# Patient Record
Sex: Male | Born: 1945 | Race: Black or African American | Hispanic: No | Marital: Married | Smoking: Former smoker
Health system: Southern US, Community
[De-identification: ages and names within clinical notes are randomized; demographics above are authoritative.]

## PROBLEM LIST (undated history)

## (undated) DIAGNOSIS — H269 Unspecified cataract: Secondary | ICD-10-CM

## (undated) DIAGNOSIS — E119 Type 2 diabetes mellitus without complications: Secondary | ICD-10-CM

## (undated) DIAGNOSIS — I639 Cerebral infarction, unspecified: Secondary | ICD-10-CM

## (undated) DIAGNOSIS — I10 Essential (primary) hypertension: Secondary | ICD-10-CM

## (undated) HISTORY — DX: Unspecified cataract: H26.9

## (undated) HISTORY — PX: NO PAST SURGERIES: SHX2092

---

## 2008-03-27 ENCOUNTER — Ambulatory Visit: Payer: Self-pay | Admitting: Internal Medicine

## 2008-03-27 LAB — CONVERTED CEMR LAB
ALT: 22 units/L (ref 0–53)
AST: 24 units/L (ref 0–37)
Albumin: 4.2 g/dL (ref 3.5–5.2)
Alkaline Phosphatase: 72 units/L (ref 39–117)
BUN: 10 mg/dL (ref 6–23)
Basophils Relative: 0.7 % (ref 0.0–1.0)
Chloride: 106 meq/L (ref 96–112)
Creatinine, Ser: 0.9 mg/dL (ref 0.4–1.5)
Direct LDL: 157.6 mg/dL
Eosinophils Relative: 1.4 % (ref 0.0–5.0)
Glucose, Bld: 109 mg/dL — ABNORMAL HIGH (ref 70–99)
HCT: 42.2 % (ref 39.0–52.0)
HDL: 34.3 mg/dL — ABNORMAL LOW (ref >39.0)
Monocytes Relative: 6.8 % (ref 3.0–12.0)
Neutrophils Relative %: 56.1 % (ref 43.0–77.0)
Platelets: 181 10*3/uL (ref 150–400)
Potassium: 4.3 meq/L (ref 3.5–5.1)
RBC: 5.04 M/uL (ref 4.22–5.81)
Total CHOL/HDL Ratio: 6.6
Total Protein: 7.1 g/dL (ref 6.0–8.3)
Triglycerides: 118 mg/dL (ref 0–149)
WBC: 7.4 10*3/uL (ref 4.5–10.5)

## 2008-04-12 ENCOUNTER — Ambulatory Visit: Payer: Self-pay | Admitting: Gastroenterology

## 2008-04-25 ENCOUNTER — Ambulatory Visit: Payer: Self-pay | Admitting: Internal Medicine

## 2008-04-26 ENCOUNTER — Ambulatory Visit: Payer: Self-pay | Admitting: Gastroenterology

## 2008-04-26 ENCOUNTER — Encounter: Payer: Self-pay | Admitting: Gastroenterology

## 2008-05-08 ENCOUNTER — Encounter: Payer: Self-pay | Admitting: Gastroenterology

## 2010-02-10 ENCOUNTER — Ambulatory Visit: Payer: Self-pay | Admitting: Internal Medicine

## 2010-02-10 DIAGNOSIS — R109 Unspecified abdominal pain: Secondary | ICD-10-CM | POA: Insufficient documentation

## 2010-08-05 ENCOUNTER — Ambulatory Visit: Payer: Self-pay | Admitting: Internal Medicine

## 2010-08-05 LAB — CONVERTED CEMR LAB
ALT: 26 units/L (ref 0–53)
Alkaline Phosphatase: 79 units/L (ref 39–117)
Basophils Relative: 0.5 % (ref 0.0–3.0)
Bilirubin, Direct: 0.1 mg/dL (ref 0.0–0.3)
Chloride: 104 meq/L (ref 96–112)
Cholesterol: 246 mg/dL — ABNORMAL HIGH (ref 0–200)
Creatinine, Ser: 0.9 mg/dL (ref 0.4–1.5)
Eosinophils Relative: 1.3 % (ref 0.0–5.0)
Hemoglobin: 13.5 g/dL (ref 13.0–17.0)
Lymphocytes Relative: 34.1 % (ref 12.0–46.0)
MCV: 84.1 fL (ref 78.0–100.0)
Monocytes Absolute: 0.7 10*3/uL (ref 0.1–1.0)
Neutrophils Relative %: 55.8 % (ref 43.0–77.0)
Nitrite: NEGATIVE
Protein, U semiquant: NEGATIVE
RBC: 4.85 M/uL (ref 4.22–5.81)
Sodium: 142 meq/L (ref 135–145)
Total CHOL/HDL Ratio: 6
Total Protein: 6.8 g/dL (ref 6.0–8.3)
Urobilinogen, UA: 0.2
VLDL: 29.2 mg/dL (ref 0.0–40.0)
WBC Urine, dipstick: NEGATIVE
WBC: 7.9 10*3/uL (ref 4.5–10.5)

## 2010-08-06 ENCOUNTER — Telehealth (INDEPENDENT_AMBULATORY_CARE_PROVIDER_SITE_OTHER): Payer: Self-pay | Admitting: *Deleted

## 2010-08-12 ENCOUNTER — Encounter: Payer: Self-pay | Admitting: Internal Medicine

## 2010-08-12 ENCOUNTER — Ambulatory Visit: Payer: Self-pay | Admitting: Internal Medicine

## 2010-08-12 DIAGNOSIS — E785 Hyperlipidemia, unspecified: Secondary | ICD-10-CM | POA: Insufficient documentation

## 2010-08-12 DIAGNOSIS — E782 Mixed hyperlipidemia: Secondary | ICD-10-CM | POA: Insufficient documentation

## 2010-10-22 NOTE — Progress Notes (Signed)
  Call back at Home Phone [phone]      Due to problems with centricity on 08/05/10 patient's specimen could not be found at the lab so i called and left a message for patient to call me back. I would like patient to come back for a redraw

## 2010-10-22 NOTE — Assessment & Plan Note (Signed)
Summary: INTERMITTENT R SIDE PAIN // RS   Vital Signs:  Patient profile:   65 year old male Weight:      226 pounds Temp:     98.3 degrees F oral BP sitting:   150 / 80  (right arm) Cuff size:   regular  Vitals Entered By: Duard Brady LPN (Feb 10, 2010 2:39 PM) CC: c/o  (R) flank pain since last night , no n/v/d or fever Is Patient Diabetic? No   CC:  c/o  (R) flank pain since last night  and no n/v/d or fever.  History of Present Illness: 65 year old patient who has enjoyed good health.  He was stable until about  9 p.m. yesterday evening when he had the onset of left flank pain.  The pain was quite severe and finally resolve somewhat about 1 a.m. where he was able to sleep through the night.  This morning had some mild the left-sided discomfort that persisted.  There is no nausea or vomiting.  He does describe some slightly worse constipation of late, but nothing significant.  He also describes the urine being somewhat darker.  When he had the pain, there  was  no alleviating or precipitating factors.  Preventive Screening-Counseling & Management  Alcohol-Tobacco     Smoking Status: quit  Allergies: 1)  ! Penicillin G Sodium (Penicillin G Sodium)  Past History:  Past Medical History: Reviewed history from 03/27/2008 and no changes required. unremarkable  Review of Systems       The patient complains of abdominal pain.  The patient denies anorexia, fever, weight loss, weight gain, vision loss, decreased hearing, hoarseness, chest pain, syncope, dyspnea on exertion, peripheral edema, prolonged cough, headaches, hemoptysis, melena, hematochezia, severe indigestion/heartburn, hematuria, incontinence, genital sores, muscle weakness, suspicious skin lesions, transient blindness, difficulty walking, depression, unusual weight change, abnormal bleeding, enlarged lymph nodes, angioedema, breast masses, and testicular masses.    Physical Exam  General:  overweight-appearing.   normal blood pressure, no distress Head:  Normocephalic and atraumatic without obvious abnormalities. No apparent alopecia or balding. Eyes:  No corneal or conjunctival inflammation noted. EOMI. Perrla. Funduscopic exam benign, without hemorrhages, exudates or papilledema. Vision grossly normal. Mouth:  Oral mucosa and oropharynx without lesions or exudates.  Teeth in good repair. Neck:  No deformities, masses, or tenderness noted. Lungs:  Normal respiratory effort, chest expands symmetrically. Lungs are clear to auscultation, no crackles or wheezes. Heart:  Normal rate and regular rhythm. S1 and S2 normal without gallop, murmur, click, rub or other extra sounds. Abdomen:  Bowel sounds positive,abdomen soft and non-tender without masses, organomegaly or hernias noted.   Impression & Recommendations:  Problem # 1:  ABDOMINAL PAIN (ICD-789.00) patient's clinical exam, now is unremarkable.  He is essentially pain free.  Urinalysis revealed trace hematuria.  Probably passed a kidney stone  Problem # 2:  SCREENING FOR HYPERTENSION (ICD-V81.1)  Complete Medication List: 1)  No Current Meds   Patient Instructions: 1)  Please schedule a follow-up appointment in 6 months for annual exam 2)  Drink as much fluid as you can tolerate for the next few days. 3)  It is important that you exercise regularly at least 20 minutes 5 times a week. If you develop chest pain, have severe difficulty breathing, or feel very tired , stop exercising immediately and seek medical attention. 4)  You need to lose weight. Consider a lower calorie diet and regular exercise.  5)  call if abdominal/flank pain recurs  Appended Document: INTERMITTENT  R SIDE PAIN // RS     Allergies: 1)  ! Penicillin G Sodium (Penicillin G Sodium)   Complete Medication List: 1)  No Current Meds    Laboratory Results   Urine Tests  Date/Time Received: Feb 10, 2010 3:57 PM  Date/Time Reported: Feb 10, 2010 3:57 PM   Routine  Urinalysis   Color: yellow Appearance: Clear Glucose: negative   (Normal Range: Negative) Bilirubin: negative   (Normal Range: Negative) Ketone: negative   (Normal Range: Negative) Spec. Gravity: 1.020   (Normal Range: 1.003-1.035) Blood: trace-intact   (Normal Range: Negative) pH: 6.0   (Normal Range: 5.0-8.0) Protein: trace   (Normal Range: Negative) Urobilinogen: 0.2   (Normal Range: 0-1) Nitrite: negative   (Normal Range: Negative) Leukocyte Esterace: negative   (Normal Range: Negative)

## 2010-10-22 NOTE — Assessment & Plan Note (Signed)
Summary: cpx/cjr   Vital Signs:  Patient profile:   65 year old male Height:      69 inches Weight:      232 pounds BMI:     34.38 Temp:     98.5 degrees F oral BP sitting:   132 / 84  (right arm) Cuff size:   regular  Vitals Entered By: Duard Brady LPN (August 12, 2010 8:22 AM) CC: cpx- doing well Is Patient Diabetic? No   CC:  cpx- doing well.  History of Present Illness: 65 year old patient who is seen today for a wellness exam.  He is asymptomatic.  Review of his laboratory studies revealed slight worsening dyslipidemia.  This was discussed at length and will try more aggressive lifestyle measures.  Preventive Screening-Counseling & Management  Alcohol-Tobacco     Smoking Status: never  Allergies: 1)  ! Penicillin G Sodium (Penicillin G Sodium)  Past History:  Past Medical History: unremarkable Hyperlipidemia  Past Surgical History: colonoscopy 2009  Family History: Reviewed history from 03/27/2008 and no changes required. father died age 74 of complications of cirrhosis mother died age 9 complications of cardiac and peripheral vascular occlusive disease  Five brothers two sisters  One brother died recently at age 61 of colon cancer one brother deceased, age 52, heart failure one sister, age 19, status post CABG, history of type 2 diabetes  Social History: Reviewed history from 03/27/2008 and no changes required. Married two children smoked tobacco  in the 1980s, but not in greater than 25 years Regular exercise-yes adheres to a near vegetarian dietSmoking Status:  never  Review of Systems  The patient denies anorexia, fever, weight loss, weight gain, vision loss, decreased hearing, hoarseness, chest pain, syncope, dyspnea on exertion, peripheral edema, prolonged cough, headaches, hemoptysis, abdominal pain, melena, hematochezia, severe indigestion/heartburn, hematuria, incontinence, genital sores, muscle weakness, suspicious skin lesions,  transient blindness, difficulty walking, depression, unusual weight change, abnormal bleeding, enlarged lymph nodes, angioedema, breast masses, and testicular masses.    Physical Exam  General:  overweight-appearing.  120/80overweight-appearing.   Head:  Normocephalic and atraumatic without obvious abnormalities. No apparent alopecia or balding. Eyes:  No corneal or conjunctival inflammation noted. EOMI. Perrla. Funduscopic exam benign, without hemorrhages, exudates or papilledema. Vision grossly normal. Ears:  External ear exam shows no significant lesions or deformities.  Otoscopic examination reveals clear canals, tympanic membranes are intact bilaterally without bulging, retraction, inflammation or discharge. Hearing is grossly normal bilaterally. Nose:  External nasal examination shows no deformity or inflammation. Nasal mucosa are pink and moist without lesions or exudates. Mouth:  Oral mucosa and oropharynx without lesions or exudates.  Teeth in good repair. Neck:  No deformities, masses, or tenderness noted. Chest Wall:  No deformities, masses, tenderness or gynecomastia noted. Breasts:  No masses or gynecomastia noted Lungs:  Normal respiratory effort, chest expands symmetrically. Lungs are clear to auscultation, no crackles or wheezes. Heart:  Normal rate and regular rhythm. S1 and S2 normal without gallop, murmur, click, rub or other extra sounds. Abdomen:  Bowel sounds positive,abdomen soft and non-tender without masses, organomegaly or hernias noted. Rectal:  No external abnormalities noted. Normal sphincter tone. No rectal masses or tenderness. Genitalia:  Testes bilaterally descended without nodularity, tenderness or masses. No scrotal masses or lesions. No penis lesions or urethral discharge. Prostate:  Prostate gland firm and smooth, no enlargement, nodularity, tenderness, mass, asymmetry or induration. Msk:  No deformity or scoliosis noted of thoracic or lumbar spine.   Pulses:   R and  L carotid,radial,femoral,dorsalis pedis and posterior tibial pulses are full and equal bilaterally Extremities:  No clubbing, cyanosis, edema, or deformity noted with normal full range of motion of all joints.   Neurologic:  No cranial nerve deficits noted. Station and gait are normal. Plantar reflexes are down-going bilaterally. DTRs are symmetrical throughout. Sensory, motor and coordinative functions appear intact. Skin:  Intact without suspicious lesions or rashes Cervical Nodes:  No lymphadenopathy noted Axillary Nodes:  No palpable lymphadenopathy Inguinal Nodes:  No significant adenopathy Psych:  Cognition and judgment appear intact. Alert and cooperative with normal attention span and concentration. No apparent delusions, illusions, hallucinations   Impression & Recommendations:  Problem # 1:  PREVENTIVE HEALTH CARE (ICD-V70.0)  Orders: EKG w/ Interpretation (93000)  Complete Medication List: 1)  No Current Meds   Patient Instructions: 1)  Please schedule a follow-up appointment in 1 year. 2)  Limit your Sodium (Salt). 3)  It is important that you exercise regularly at least 20 minutes 5 times a week. If you develop chest pain, have severe difficulty breathing, or feel very tired , stop exercising immediately and seek medical attention. 4)  You need to lose weight. Consider a lower calorie diet and regular exercise.    Orders Added: 1)  EKG w/ Interpretation [93000] 2)  Est. Patient 40-64 years [phone]

## 2013-04-24 ENCOUNTER — Encounter: Payer: Self-pay | Admitting: Gastroenterology

## 2014-11-12 ENCOUNTER — Encounter: Payer: Self-pay | Admitting: Gastroenterology

## 2015-01-29 ENCOUNTER — Encounter: Payer: Self-pay | Admitting: Gastroenterology

## 2015-05-04 DIAGNOSIS — I639 Cerebral infarction, unspecified: Secondary | ICD-10-CM

## 2015-05-04 HISTORY — DX: Cerebral infarction, unspecified: I63.9

## 2015-05-05 ENCOUNTER — Telehealth: Payer: Self-pay | Admitting: Physician Assistant

## 2015-05-05 ENCOUNTER — Inpatient Hospital Stay (HOSPITAL_COMMUNITY)
Admission: EM | Admit: 2015-05-05 | Discharge: 2015-05-08 | DRG: 066 | Disposition: A | Discharge status: Home Health | Payer: Medicare Other | Attending: Internal Medicine | Admitting: Internal Medicine

## 2015-05-05 ENCOUNTER — Other Ambulatory Visit: Payer: Self-pay

## 2015-05-05 ENCOUNTER — Emergency Department (HOSPITAL_COMMUNITY): Payer: Medicare Other

## 2015-05-05 ENCOUNTER — Inpatient Hospital Stay (HOSPITAL_COMMUNITY): Payer: Medicare Other

## 2015-05-05 ENCOUNTER — Ambulatory Visit (HOSPITAL_COMMUNITY)
Admission: RE | Admit: 2015-05-05 | Discharge: 2015-05-05 | Disposition: A | Discharge status: Home / Self Care | Payer: Medicare Other | Source: Ambulatory Visit | Attending: Family Medicine | Admitting: Family Medicine

## 2015-05-05 ENCOUNTER — Ambulatory Visit: Payer: Self-pay | Admitting: Internal Medicine

## 2015-05-05 ENCOUNTER — Ambulatory Visit (INDEPENDENT_AMBULATORY_CARE_PROVIDER_SITE_OTHER): Payer: BC Managed Care – PPO | Admitting: Family Medicine

## 2015-05-05 ENCOUNTER — Encounter (HOSPITAL_COMMUNITY): Payer: Self-pay | Admitting: *Deleted

## 2015-05-05 VITALS — BP 160/100 | HR 90 | Temp 98.9°F | Resp 16 | Ht 69.0 in | Wt 238.0 lb

## 2015-05-05 DIAGNOSIS — Z23 Encounter for immunization: Secondary | ICD-10-CM

## 2015-05-05 DIAGNOSIS — Z8249 Family history of ischemic heart disease and other diseases of the circulatory system: Secondary | ICD-10-CM

## 2015-05-05 DIAGNOSIS — R42 Dizziness and giddiness: Secondary | ICD-10-CM | POA: Diagnosis not present

## 2015-05-05 DIAGNOSIS — R110 Nausea: Secondary | ICD-10-CM

## 2015-05-05 DIAGNOSIS — E1165 Type 2 diabetes mellitus with hyperglycemia: Secondary | ICD-10-CM | POA: Diagnosis not present

## 2015-05-05 DIAGNOSIS — I639 Cerebral infarction, unspecified: Secondary | ICD-10-CM | POA: Diagnosis not present

## 2015-05-05 DIAGNOSIS — R269 Unspecified abnormalities of gait and mobility: Secondary | ICD-10-CM | POA: Diagnosis present

## 2015-05-05 DIAGNOSIS — A499 Bacterial infection, unspecified: Secondary | ICD-10-CM

## 2015-05-05 DIAGNOSIS — N390 Urinary tract infection, site not specified: Secondary | ICD-10-CM

## 2015-05-05 DIAGNOSIS — R278 Other lack of coordination: Secondary | ICD-10-CM | POA: Diagnosis present

## 2015-05-05 DIAGNOSIS — R29818 Other symptoms and signs involving the nervous system: Secondary | ICD-10-CM | POA: Diagnosis not present

## 2015-05-05 DIAGNOSIS — E119 Type 2 diabetes mellitus without complications: Secondary | ICD-10-CM

## 2015-05-05 DIAGNOSIS — Z87891 Personal history of nicotine dependence: Secondary | ICD-10-CM | POA: Diagnosis not present

## 2015-05-05 DIAGNOSIS — R030 Elevated blood-pressure reading, without diagnosis of hypertension: Secondary | ICD-10-CM

## 2015-05-05 DIAGNOSIS — Z880 Allergy status to penicillin: Secondary | ICD-10-CM | POA: Diagnosis not present

## 2015-05-05 DIAGNOSIS — Z6832 Body mass index (BMI) 32.0-32.9, adult: Secondary | ICD-10-CM

## 2015-05-05 DIAGNOSIS — R2689 Other abnormalities of gait and mobility: Secondary | ICD-10-CM | POA: Insufficient documentation

## 2015-05-05 DIAGNOSIS — I10 Essential (primary) hypertension: Secondary | ICD-10-CM | POA: Diagnosis present

## 2015-05-05 DIAGNOSIS — E1100 Type 2 diabetes mellitus with hyperosmolarity without nonketotic hyperglycemic-hyperosmolar coma (NKHHC): Secondary | ICD-10-CM | POA: Diagnosis not present

## 2015-05-05 DIAGNOSIS — E785 Hyperlipidemia, unspecified: Secondary | ICD-10-CM | POA: Diagnosis present

## 2015-05-05 DIAGNOSIS — E669 Obesity, unspecified: Secondary | ICD-10-CM | POA: Diagnosis present

## 2015-05-05 DIAGNOSIS — IMO0002 Reserved for concepts with insufficient information to code with codable children: Secondary | ICD-10-CM | POA: Diagnosis present

## 2015-05-05 DIAGNOSIS — I340 Nonrheumatic mitral (valve) insufficiency: Secondary | ICD-10-CM | POA: Diagnosis not present

## 2015-05-05 DIAGNOSIS — R270 Ataxia, unspecified: Secondary | ICD-10-CM

## 2015-05-05 DIAGNOSIS — R112 Nausea with vomiting, unspecified: Secondary | ICD-10-CM

## 2015-05-05 DIAGNOSIS — I6340 Cerebral infarction due to embolism of unspecified cerebral artery: Secondary | ICD-10-CM | POA: Diagnosis not present

## 2015-05-05 DIAGNOSIS — I63219 Cerebral infarction due to unspecified occlusion or stenosis of unspecified vertebral arteries: Secondary | ICD-10-CM | POA: Diagnosis not present

## 2015-05-05 DIAGNOSIS — I63119 Cerebral infarction due to embolism of unspecified vertebral artery: Secondary | ICD-10-CM | POA: Diagnosis not present

## 2015-05-05 HISTORY — DX: Cerebral infarction, unspecified: I63.9

## 2015-05-05 HISTORY — DX: Essential (primary) hypertension: I10

## 2015-05-05 HISTORY — DX: Type 2 diabetes mellitus without complications: E11.9

## 2015-05-05 LAB — COMPLETE METABOLIC PANEL WITHOUT GFR
Albumin: 4.6 g/dL (ref 3.6–5.1)
Alkaline Phosphatase: 97 U/L (ref 40–115)
GFR, Est Non African American: 88 mL/min (ref >=60)
Glucose, Bld: 165 mg/dL — ABNORMAL HIGH (ref 65–99)
Sodium: 141 mmol/L (ref 135–146)
Total Bilirubin: 0.6 mg/dL (ref 0.2–1.2)
Total Protein: 7.7 g/dL (ref 6.1–8.1)

## 2015-05-05 LAB — COMPREHENSIVE METABOLIC PANEL
ALT: 40 U/L (ref 17–63)
ANION GAP: 8 (ref 5–15)
AST: 32 U/L (ref 15–41)
Albumin: 3.9 g/dL (ref 3.5–5.0)
Alkaline Phosphatase: 86 U/L (ref 38–126)
BUN: 8 mg/dL (ref 6–20)
CALCIUM: 9.1 mg/dL (ref 8.9–10.3)
CHLORIDE: 105 mmol/L (ref 101–111)
CO2: 24 mmol/L (ref 22–32)
Creatinine, Ser: 0.82 mg/dL (ref 0.61–1.24)
GFR calc Af Amer: >60 mL/min (ref >60)
GFR calc non Af Amer: >60 mL/min (ref >60)
Glucose, Bld: 217 mg/dL — ABNORMAL HIGH (ref 65–99)
POTASSIUM: 3.8 mmol/L (ref 3.5–5.1)
SODIUM: 137 mmol/L (ref 135–145)
Total Bilirubin: 0.6 mg/dL (ref 0.3–1.2)
Total Protein: 7.2 g/dL (ref 6.5–8.1)

## 2015-05-05 LAB — POCT CBC
Granulocyte percent: 59.5 % (ref 37–80)
HCT, POC: 48.6 % (ref 43.5–53.7)
Hemoglobin: 14.8 g/dL (ref 14.1–18.1)
Lymph, poc: 3.7 — AB (ref 0.6–3.4)
MCH, POC: 25.3 pg — AB (ref 27–31.2)
MCHC: 30.4 g/dL — AB (ref 31.8–35.4)
MCV: 83.2 fL (ref 80–97)
MID (cbc): 0.8 (ref 0–0.9)
MPV: 9.1 fL (ref 0–99.8)
POC Granulocyte: 6.5 (ref 2–6.9)
POC LYMPH PERCENT: 33.4 % (ref 10–50)
POC MID %: 7.1 % (ref 0–12)
Platelet Count, POC: 204 K/uL (ref 142–424)
RBC: 5.85 M/uL (ref 4.69–6.13)
RDW, POC: 14 %
WBC: 11 K/uL — AB (ref 4.6–10.2)

## 2015-05-05 LAB — CBC
HCT: 46 % (ref 39.0–52.0)
Hemoglobin: 14.8 g/dL (ref 13.0–17.0)
MCH: 27.4 pg (ref 26.0–34.0)
MCHC: 32.2 g/dL (ref 30.0–36.0)
MCV: 85.2 fL (ref 78.0–100.0)
PLATELETS: 215 10*3/uL (ref 150–400)
RBC: 5.4 MIL/uL (ref 4.22–5.81)
RDW: 13.4 % (ref 11.5–15.5)
WBC: 10.3 10*3/uL (ref 4.0–10.5)

## 2015-05-05 LAB — PROTIME-INR
INR: 1.08 (ref 0.00–1.49)
PROTHROMBIN TIME: 14.2 s (ref 11.6–15.2)

## 2015-05-05 LAB — COMPLETE METABOLIC PANEL WITH GFR
ALT: 42 U/L (ref 9–46)
AST: 25 U/L (ref 10–35)
BUN: 9 mg/dL (ref 7–25)
CO2: 27 mmol/L (ref 20–31)
Calcium: 10 mg/dL (ref 8.6–10.3)
Chloride: 103 mmol/L (ref 98–110)
Creat: 0.88 mg/dL (ref 0.70–1.25)
GFR, Est African American: >89 mL/min (ref >=60)
Potassium: 4.3 mmol/L (ref 3.5–5.3)

## 2015-05-05 LAB — POCT URINALYSIS DIPSTICK
Bilirubin, UA: NEGATIVE
Blood, UA: NEGATIVE
Glucose, UA: >=1000
Ketones, UA: NEGATIVE
Leukocytes, UA: NEGATIVE
Nitrite, UA: POSITIVE
Spec Grav, UA: 1.025
Urobilinogen, UA: 0.2
pH, UA: 6

## 2015-05-05 LAB — GLUCOSE, POCT (MANUAL RESULT ENTRY): POC Glucose: 161 mg/dl — AB (ref 70–99)

## 2015-05-05 LAB — I-STAT TROPONIN, ED: Troponin i, poc: 0 ng/mL (ref 0.00–0.08)

## 2015-05-05 LAB — POCT UA - MICROSCOPIC ONLY
Bacteria, U Microscopic: NEGATIVE
Casts, Ur, LPF, POC: NEGATIVE
Crystals, Ur, HPF, POC: NEGATIVE
Yeast, UA: NEGATIVE

## 2015-05-05 LAB — DIFFERENTIAL
BASOS PCT: 0 % (ref 0–1)
Basophils Absolute: 0 10*3/uL (ref 0.0–0.1)
EOS ABS: 0.1 10*3/uL (ref 0.0–0.7)
EOS PCT: 1 % (ref 0–5)
Lymphocytes Relative: 36 % (ref 12–46)
Lymphs Abs: 3.7 10*3/uL (ref 0.7–4.0)
MONO ABS: 0.7 10*3/uL (ref 0.1–1.0)
Monocytes Relative: 7 % (ref 3–12)
NEUTROS PCT: 56 % (ref 43–77)
Neutro Abs: 5.8 10*3/uL (ref 1.7–7.7)

## 2015-05-05 LAB — CBG MONITORING, ED
GLUCOSE-CAPILLARY: 195 mg/dL — AB (ref 65–99)
GLUCOSE-CAPILLARY: 204 mg/dL — AB (ref 65–99)

## 2015-05-05 LAB — GLUCOSE, CAPILLARY: Glucose-Capillary: 139 mg/dL — ABNORMAL HIGH (ref 65–99)

## 2015-05-05 LAB — APTT: aPTT: 29 seconds (ref 24–37)

## 2015-05-05 LAB — POCT GLYCOSYLATED HEMOGLOBIN (HGB A1C): Hemoglobin A1C: 7.5

## 2015-05-05 MED ORDER — METFORMIN HCL 1000 MG PO TABS: 1000.0000 mg | ORAL_TABLET | Freq: Every day | ORAL | Status: DC

## 2015-05-05 MED ORDER — ASPIRIN EC 81 MG PO TBEC
81.0000 mg | DELAYED_RELEASE_TABLET | Freq: Every day | ORAL | Status: DC
  Administered 2015-05-06: 81 mg via ORAL
  Filled 2015-05-05: qty 1

## 2015-05-05 MED ORDER — STROKE: EARLY STAGES OF RECOVERY BOOK
Freq: Once | Status: AC
Start: 2015-05-05 — End: 2015-05-05
  Administered 2015-05-05
  Filled 2015-05-05: qty 1

## 2015-05-05 MED ORDER — SENNOSIDES-DOCUSATE SODIUM 8.6-50 MG PO TABS: 1.0000 | ORAL_TABLET | Freq: Every evening | ORAL | Status: DC | PRN

## 2015-05-05 MED ORDER — PNEUMOCOCCAL VAC POLYVALENT 25 MCG/0.5ML IJ INJ
0.5000 mL | INJECTION | INTRAMUSCULAR | Status: AC
  Administered 2015-05-06: 0.5 mL via INTRAMUSCULAR
  Filled 2015-05-05 (×2): qty 0.5

## 2015-05-05 MED ORDER — SULFAMETHOXAZOLE-TRIMETHOPRIM 800-160 MG PO TABS: 1.0000 | ORAL_TABLET | Freq: Two times a day (BID) | ORAL | Status: DC

## 2015-05-05 MED ORDER — ASPIRIN 325 MG PO TABS
325.0000 mg | ORAL_TABLET | Freq: Once | ORAL | Status: AC
  Administered 2015-05-05: 325 mg via ORAL
  Filled 2015-05-05: qty 1

## 2015-05-05 NOTE — ED Provider Notes (Signed)
CSN: [id number]     Arrival date & time 05/05/15  1533 History   First MD Initiated Contact with Patient 05/05/15 1738     Chief Complaint  Patient presents with  . Dizziness  . Altered Mental Status     (Consider location/radiation/quality/duration/timing/severity/associated sxs/prior Treatment) HPI Patient awakened from sleep yesterday feeling dizzy. He reports he tried to get up and walk around but his dizziness was so severe that he had to hold onto things to be able to move. He denies he had a headache in association with this. He did however have to crawl back into his home when he tried to go outside to walk his dog. This was due to the intensity of his vertigo. He denies that he had one arm or leg that seemed to dysfunctional. He also denies that he had blurred vision or double vision. He denies prior similar type symptoms. He had not been ill prior to the onset of these symptoms. History reviewed. No pertinent past medical history. History reviewed. No pertinent past surgical history. Family History  Problem Relation Age of Onset  . Heart disease Mother   . Cancer Brother   . Heart disease Brother    Social History  Substance Use Topics  . Smoking status: Former Research scientist (life sciences)  . Smokeless tobacco: None  . Alcohol Use: No    Review of Systems 10 Systems reviewed and are negative for acute change except as noted in the HPI.    Allergies  Penicillins  Home Medications   Prior to Admission medications   Medication Sig Start Date End Date Taking? Authorizing Provider  metFORMIN (GLUCOPHAGE) 1000 MG tablet Take 1 tablet (1,000 mg total) by mouth daily after breakfast. 05/05/15  Yes Thao P Le, DO  naproxen sodium (ANAPROX) 220 MG tablet Take 220 mg by mouth every 12 (twelve) hours as needed (knee pain).   Yes Historical Provider, MD  sulfamethoxazole-trimethoprim (BACTRIM DS,SEPTRA DS) 800-160 MG per tablet Take 1 tablet by mouth 2 (two) times daily. 05/05/15  Yes Thao P Le, DO    BP 113/79 mmHg  Pulse 81  Temp(Src) 97.7 F (36.5 C) (Oral)  Resp 15  Ht 5\' 11"  (1.803 m)  Wt 237 lb 3.2 oz (107.593 kg)  BMI 33.10 kg/m2  SpO2 98% Physical Exam  Constitutional: He is oriented to person, place, and time. He appears well-developed and well-nourished.  HENT:  Head: Normocephalic and atraumatic.  Normal ENT examination with bilateral normal TMs.  Eyes: EOM are normal. Pupils are equal, round, and reactive to light.  Neck: Neck supple.  Cardiovascular: Normal rate, regular rhythm, normal heart sounds and intact distal pulses.   Pulmonary/Chest: Effort normal and breath sounds normal.  Abdominal: Soft. Bowel sounds are normal. He exhibits no distension. There is no tenderness.  Musculoskeletal: Normal range of motion. He exhibits no edema.  Neurological: He is alert and oriented to person, place, and time. He has normal strength. No cranial nerve deficit. He exhibits normal muscle tone. Coordination normal. GCS eye subscore is 4. GCS verbal subscore is 5. GCS motor subscore is 6.  Normal finger-nose examination. Normal heel shin examination.  Skin: Skin is warm, dry and intact.  Psychiatric: He has a normal mood and affect.    ED Course  Procedures (including critical care time) Labs Review Labs Reviewed  COMPREHENSIVE METABOLIC PANEL - Abnormal; Notable for the following:    Glucose, Bld 217 (*)    All other components within normal limits  CBG MONITORING, ED -  Abnormal; Notable for the following:    Glucose-Capillary 195 (*)    All other components within normal limits  CBG MONITORING, ED - Abnormal; Notable for the following:    Glucose-Capillary 204 (*)    All other components within normal limits  PROTIME-INR  APTT  CBC  DIFFERENTIAL  HEMOGLOBIN A1C  LIPID PANEL  I-STAT TROPOININ, ED    Imaging Review Ct Head Wo Contrast  05/05/2015   CLINICAL DATA:  Dizziness for 1 day. New onset diabetes and elevated blood pressure. Initial encounter.  EXAM:  CT HEAD WITHOUT CONTRAST  TECHNIQUE: Contiguous axial images were obtained from the base of the skull through the vertex without intravenous contrast.  COMPARISON:  None.  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 No evidence of acute intracranial hemorrhage, mass lesion, extra-axial fluid collection or midline shift. There is no hydrocephalus or additional evidence of acute stroke. Intracranial vascular calcifications are noted.  Minimal left ethmoid sinus mucosal thickening. The visualized paranasal sinuses, mastoid air cells and middle ears are otherwise clear. The calvarium is intact.  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  These results were called by telephone at the time of interpretation on 05/05/2015 at 3:06 pm to [place], [place] for Dr. Rikki Spearing , who verbally acknowledged these results.   Electronically Signed   By: Richardean Sale M.D.   On: 05/05/2015 15:09   I, Charlesetta Shanks, personally reviewed and evaluated these images and lab results as part of my medical decision-making.   EKG Interpretation   Date/Time:  Monday May 05 2015 15:50:24 EDT Ventricular Rate:  82 PR Interval:  180 QRS Duration: 94 QT Interval:  376 QTC Calculation: 439 R Axis:   -18 Text Interpretation:  Normal sinus rhythm Normal ECG anonspecific inferior  t wave changes. no STEMI. 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 He is referred from his outpatient provider who identified abnormal CT. Patient's mental status is clear. His airway is patent and his voice is clear without any respiratory distress. He does have confirmed CVA and will be admitted for  further treatment.  Charlesetta Shanks, MD 05/05/15 2104

## 2015-05-05 NOTE — Progress Notes (Signed)
 Chief Complaint:  Chief Complaint  Patient presents with  . Dizziness    Onset 3 months off and on/ This episode started yesterday    HPI: Trevor Wheeler is a 69 y.o. male who reports to [facility] today complaining of woke up yesterday morning and was dizzy, he threw up when he took an Aleve, he was falling over and could not stand, he has better control today then yesterday. He could not do this yesterday. He has not had any slurred speech, CP, SOB, HA,. He tried to stand and was feeling dizzy. When he went to the bathroom he had to hold the walls Then at 2 pm he took the dog outside and he was dizzy and had to crawl back into the house. He lives with his wife and daughter. He has more dizziness when he stands up. As long as he was laying yesterday he was ok. AS soon as he got up he was dizzy. He had just woke up and was getting to get up and felt warm and dizzy when he tried to stand up.    He had this prior episode back in May and got disoriented about 3 hours. He did not go to get care at that time. He does not have dizziness with movement of his head, or his eyes. He is usually dizzy when he is walking.   Denies  HTN, No DM, no urianry sxs, no vision changes but has had floaters for the last 2 years, 2 in right an 1 in the left. He has been to the eye doctor  His daughter has MS.  No inner ear or URI sxs, no cp sob or palpiations. No headache. He has been having normal urination but has had some constipation  He was seen by Dr Burnice Logan in 2011 and had some elevated BPS but no formal dx of HTN was advised to monitor. Wanted him to watch his BP. No meds in the past. He has not eaten today   BP Readings from Last 3 Encounters:  05/05/15 160/100  08/12/10 132/84  08/12/10 132/84    History reviewed. No pertinent past medical history. History reviewed. No pertinent past surgical history. Social History   Social History  . Marital Status: Married    Spouse Name: N/A  . Number of  Children: N/A  . Years of Education: N/A   Social History Main Topics  . Smoking status: Former Research scientist (life sciences)  . Smokeless tobacco: None  . Alcohol Use: No  . Drug Use: No  . Sexual Activity: Not Asked   Other Topics Concern  . None   Social History Narrative  . None   Family History  Problem Relation Age of Onset  . Heart disease Mother   . Cancer Brother   . Heart disease Brother    Allergies  Allergen Reactions  . Penicillins    Prior to Admission medications   Not on File     ROS: The patient denies fevers, chills, night sweats, unintentional weight loss, chest pain, palpitations, wheezing, dyspnea on exertion, nausea, vomiting, abdominal pain, dysuria, hematuria, melena, numbness, weakness, or tingling.   All other systems have been reviewed and were otherwise negative with the exception of those mentioned in the HPI and as above.    PHYSICAL EXAM: Filed Vitals:   05/05/15 1121  BP: 160/100  Pulse: 90  Temp: 98.9 F (37.2 C)  Resp: 16   Body mass index is 35.13 kg/(m^2).   General:  Alert, no acute distress HEENT:  Normocephalic, atraumatic, oropharynx patent. EOMI, PERRLA, Tm normal Cardiovascular:  Regular rate and rhythm, no rubs murmurs or gallops.  No Carotid bruits, radial pulse intact. No pedal edema.  Respiratory: Clear to auscultation bilaterally.  No wheezes, rales, or rhonchi.  No cyanosis, no use of accessory musculature Abdominal: No organomegaly, abdomen is soft and non-tender, positive bowel sounds. No masses. Skin: No rashes. Neurologic: Facial musculature symmetric. Psychiatric: Patient acts appropriately throughout our interaction. Lymphatic: No cervical or submandibular lymphadenopathy Musculoskeletal: Gait intact but wobbly. No edema, tenderness UE and   5/5 strength  , neg nystagmus, neg dix hallpike Normal conversational memory , recall 2/3 Normal neuro exam except for wobbly gait    LABS Results for orders placed or performed in  visit on 05/05/15  POCT CBC  Result Value Ref Range   WBC 11.0 (A) 4.6 - 10.2 K/uL   Lymph, poc 3.7 (A) 0.6 - 3.4   POC LYMPH PERCENT 33.4 10 - 50 %L   MID (cbc) 0.8 0 - 0.9   POC MID % 7.1 0 - 12 %M   POC Granulocyte 6.5 2 - 6.9   Granulocyte percent 59.5 37 - 80 %G   RBC 5.85 4.69 - 6.13 M/uL   Hemoglobin 14.8 14.1 - 18.1 g/dL   HCT, POC 48.6 43.5 - 53.7 %   MCV 83.2 80 - 97 fL   MCH, POC 25.3 (A) 27 - 31.2 pg   MCHC 30.4 (A) 31.8 - 35.4 g/dL   RDW, POC 14.0 %   Platelet Count, POC 204 142 - 424 K/uL   MPV 9.1 0 - 99.8 fL  POCT UA - Microscopic Only  Result Value Ref Range   WBC, Ur, HPF, POC 0-3    RBC, urine, microscopic 0-1    Bacteria, U Microscopic neg    Mucus, UA moderate    Epithelial cells, urine per micros 0-4    Crystals, Ur, HPF, POC neg    Casts, Ur, LPF, POC neg    Yeast, UA neg   POCT urinalysis dipstick  Result Value Ref Range   Color, UA yellow    Clarity, UA clear    Glucose, UA >=1000    Bilirubin, UA neg    Ketones, UA neg    Spec Grav, UA 1.025    Blood, UA neg    pH, UA 6.0    Protein, UA trace    Urobilinogen, UA 0.2    Nitrite, UA positive    Leukocytes, UA Negative Negative  POCT glucose (manual entry)  Result Value Ref Range   POC Glucose 161 (A) 70 - 99 mg/dl  POCT glycosylated hemoglobin (Hb A1C)  Result Value Ref Range   Hemoglobin A1C 7.5      EKG/XRAY:   Primary read interpreted by Dr. Marin Comment at [facility].   ASSESSMENT/PLAN: Encounter Diagnoses  Name Primary?  . Dizziness and giddiness Yes  . Nausea without vomiting   . Elevated blood pressure reading without diagnosis of hypertension   . UTI (urinary tract infection), bacterial   . Type 2 diabetes mellitus without complication    This is not BPPV He has new onset DM and also UTI but his gait is very wobbly and also his BP is also elevated without prior dx of HTN [facility] 184/94, 69, 188/102. 80; 174/102, 87 Labs and urine cx pending Rx metformin 1000 mg daily Rx  Bactrim BID, he has PCN allergy and has never been on keflex I  will get a stat CT scan to rule out any hermorhage/TIA/CVA/normal pressure hydrocephalus before giving patient any medicines for elevated BP.  Fu by phone, will call him tomorrow.   Gross sideeffects, risk and benefits, and alternatives of medications d/w patient. Patient is aware that all medications have potential sideeffects and we are unable to predict every sideeffect or drug-drug interaction that may occur.    DO  05/05/2015 2:25 PM

## 2015-05-05 NOTE — ED Notes (Signed)
Pt reports waking up yesterday with dizziness, feeling disoriented and unsteady gait. Went to ucc today and was diagnosed with diabetes and UTI and sent for CT scan. Pt sent here today due to ct scan being + for stroke. Pt reports improvement in symptoms throughout the day but still having dizziness, a&ox4 at triage.

## 2015-05-05 NOTE — Consult Note (Signed)
Referring Physician: ED    Chief Complaint:  Acute ataxia with CT brain suggestive of stroke.  HPI:                                                                                                                                         Trevor Wheeler is an 69 y.o. male with apparently non relevant past medical history, comes in for further evaluation of the above stated symptoms. He is accompanied by hius wife. Trevor Wheeler stated that he never head similar symptoms before, but yesterday morning he woke up feeling very off balance and walking was a daunting task. Her expressed that " I had to manage to walk, c\ouldn't stand without falling over, and had to hold to the walls". Said that he vomited x 1 after taking a tablet of Aleve and had to stay in bed the whole day yesterday due to extreme imbalance. Denied associated HA, vertigo, double vision, difficulty swallowing, focal weakness or numbness, confusion, slurred speech, language or vision impairment. 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     Date last known well: 05/03/15 Time last known well: 9 pm tPA Given: no, late presentation   History reviewed. No pertinent past medical history.  History reviewed. No pertinent past surgical history.  Family History  Problem Relation Age of Onset  . Heart disease Mother   . Cancer Brother   . Heart disease Brother    Social History:  reports that he has quit smoking. He does not have any smokeless tobacco history on file. He reports that he does not drink alcohol or use illicit drugs. Family history: no MS, epilepsy, PD, brain tumor, or brain aneurysm Allergies:  Allergies  Allergen Reactions  . Penicillins Hives    Medications:                                                                                                                            I have reviewed the patient's current medications.  ROS:  History obtained from wife, chart review and the patient  General ROS: negative for - chills, fatigue, fever, night sweats, weight gain or weight loss Psychological ROS: negative for - behavioral disorder, hallucinations, memory difficulties, mood swings or suicidal ideation Ophthalmic ROS: negative for - blurry vision, double vision, eye pain or loss of vision ENT ROS: negative for - epistaxis, nasal discharge, oral lesions, sore throat, tinnitus or vertigo Allergy and Immunology ROS: negative for - hives or itchy/watery eyes Hematological and Lymphatic ROS: negative for - bleeding problems, bruising or swollen lymph nodes Endocrine ROS: negative for - galactorrhea, hair pattern changes, polydipsia/polyuria or temperature intolerance Respiratory ROS: negative for - cough, hemoptysis, shortness of breath or wheezing Cardiovascular ROS: negative for - chest pain, dyspnea on exertion, edema or irregular heartbeat Gastrointestinal ROS: negative for - abdominal pain, diarrhea, hematemesis, nausea/vomiting or stool incontinence Genito-Urinary ROS: negative for - dysuria, hematuria, incontinence or urinary frequency/urgency Musculoskeletal ROS: negative for - joint swelling or muscular weakness Neurological ROS: as noted in HPI Dermatological ROS: negative for rash and skin lesion changes  Physical exam:  Constitutional: well developed, pleasant male in no apparent distress. Blood pressure 155/78, pulse 85, temperature 97.7 F (36.5 C), temperature source Oral, resp. rate 17, height 5' 11"  (1.803 m), weight 107.593 kg (237 lb 3.2 oz), SpO2 98 %. Eyes: no jaundice or exophthalmos.  Head: normocephalic. Neck: supple, no bruits, no JVD. Cardiac: no  murmurs. Lungs: clear. Abdomen: soft, no tender, no mass. Extremities: no edema, clubbing, or cyanosis.  Skin: no rash  Neurologic Examination:                                                                                                      General: Mental Status: Alert, oriented, thought content appropriate.  Speech fluent without evidence of aphasia.  Able to follow 3 step commands without difficulty. Cranial Nerves: II: Discs flat bilaterally; Visual fields grossly normal, pupils equal, round, reactive to light and accommodation III,IV, VI: ptosis not present, extra-ocular motions intact bilaterally V,VII: smile symmetric, facial light touch sensation normal bilaterally VIII: hearing normal bilaterally IX,X: uvula rises symmetrically XI: bilateral shoulder shrug XII: midline tongue extension without atrophy or fasciculations  Motor: Right : Upper extremity   5/5    Left:     Upper extremity   5/5  Lower extremity   5/5     Lower extremity   5/5 Tone and bulk:normal tone throughout; no atrophy noted Sensory: Pinprick and light touch intact throughout, bilaterally Deep Tendon Reflexes:  Right: Upper Extremity   Left: Upper extremity   biceps (C-5 to C-6) 2/4   biceps (C-5 to C-6) 2/4 tricep (C7) 2/4    triceps (C7) 2/4 Brachioradialis (C6) 2/4  Brachioradialis (C6) 2/4  Lower Extremity Lower Extremity  quadriceps (L-2 to L-4) 2/4   quadriceps (L-2 to L-4) 2/4 Achilles (S1) 2/4   Achilles (S1) 2/4  Plantars: Right: downgoing   Left: downgoing Cerebellar: normal finger-to-nose,  normal heel-to-shin test Gait:  No tested due to multiple leads, safety reasons  CV: pulses palpable throughout  Results for orders placed or performed during the hospital encounter of 05/05/15 (from the past 48 hour(s))  CBG monitoring, ED     Status: Abnormal   Collection Time: 05/05/15  3:49 PM  Result Value Ref Range   Glucose-Capillary 195 (H) 65 - 99 mg/dL  Protime-INR     Status:  None   Collection Time: 05/05/15  4:02 PM  Result Value Ref Range   Prothrombin Time 14.2 11.6 - 15.2 seconds   INR 1.08 0.00 - 1.49  APTT     Status: None   Collection Time: 05/05/15  4:02 PM  Result Value Ref Range   aPTT 29 24 - 37 seconds  CBC     Status: None   Collection Time: 05/05/15  4:02 PM  Result Value Ref Range   WBC 10.3 4.0 - 10.5 K/uL   RBC 5.40 4.22 - 5.81 MIL/uL   Hemoglobin 14.8 13.0 - 17.0 g/dL   HCT 46.0 39.0 - 52.0 %   MCV 85.2 78.0 - 100.0 fL   MCH 27.4 26.0 - 34.0 pg   MCHC 32.2 30.0 - 36.0 g/dL   RDW 13.4 11.5 - 15.5 %   Platelets 215 150 - 400 K/uL  Differential     Status: None   Collection Time: 05/05/15  4:02 PM  Result Value Ref Range   Neutrophils Relative % 56 43 - 77 %   Neutro Abs 5.8 1.7 - 7.7 K/uL   Lymphocytes Relative 36 12 - 46 %   Lymphs Abs 3.7 0.7 - 4.0 K/uL   Monocytes Relative 7 3 - 12 %   Monocytes Absolute 0.7 0.1 - 1.0 K/uL   Eosinophils Relative 1 0 - 5 %   Eosinophils Absolute 0.1 0.0 - 0.7 K/uL   Basophils Relative 0 0 - 1 %   Basophils Absolute 0.0 0.0 - 0.1 K/uL  Comprehensive metabolic panel     Status: Abnormal   Collection Time: 05/05/15  4:02 PM  Result Value Ref Range   Sodium 137 135 - 145 mmol/L   Potassium 3.8 3.5 - 5.1 mmol/L   Chloride 105 101 - 111 mmol/L   CO2 24 22 - 32 mmol/L   Glucose, Bld 217 (H) 65 - 99 mg/dL   BUN 8 6 - 20 mg/dL   Creatinine, Ser 0.82 0.61 - 1.24 mg/dL   Calcium 9.1 8.9 - 10.3 mg/dL   Total Protein 7.2 6.5 - 8.1 g/dL   Albumin 3.9 3.5 - 5.0 g/dL   AST 32 15 - 41 U/L   ALT 40 17 - 63 U/L   Alkaline Phosphatase 86 38 - 126 U/L   Total Bilirubin 0.6 0.3 - 1.2 mg/dL   GFR calc non Af Amer >60 >60 mL/min   GFR calc Af Amer >60 >60 mL/min    Comment: (NOTE) The eGFR has been calculated using the CKD EPI equation. This calculation has not been validated in all clinical situations. eGFR's persistently <60 mL/min signify possible Chronic Kidney Disease.    Anion gap 8 5 - 15   I-stat troponin, ED (not at [facility], [facility])     Status: None   Collection Time: 05/05/15  4:27 PM  Result Value Ref Range   Troponin i, poc 0.00 0.00 - 0.08 ng/mL   Comment 3            Comment: Due to the release kinetics of cTnI, a negative result within the first hours of the onset of symptoms does not rule  out myocardial infarction with certainty. If myocardial infarction is still suspected, repeat the test at appropriate intervals.   CBG monitoring, ED     Status: Abnormal   Collection Time: 05/05/15  5:26 PM  Result Value Ref Range   Glucose-Capillary 204 (H) 65 - 99 mg/dL   Ct Head Wo Contrast  05/05/2015   CLINICAL DATA:  Dizziness for 1 day. New onset diabetes and elevated blood pressure. Initial encounter.  EXAM: CT HEAD WITHOUT CONTRAST  TECHNIQUE: Contiguous axial images were obtained from the base of the skull through the vertex without intravenous contrast.  COMPARISON:  None.  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 No evidence of acute intracranial hemorrhage, mass lesion, extra-axial fluid collection or midline shift. There is no hydrocephalus or additional evidence of acute stroke. Intracranial vascular calcifications are noted.  Minimal left ethmoid sinus mucosal thickening. The visualized paranasal sinuses, mastoid air cells and middle ears are otherwise clear. The calvarium is intact.  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  These results were called by telephone at the time of interpretation on 05/05/2015 at 3:06 pm to [place], [place] for Dr. Rikki Spearing , who verbally acknowledged these results.   Electronically Signed   By: Richardean Sale M.D.   On: 05/05/2015 15:09    Assessment: 69 y.o. male with clinical and CT findings suggestive of an early acute infarction involving left cerebellum. He is out of the window for  thrombolysis. Admit to medicine. Complete stroke work up. Start aspirin 81 mg after passing swallowing evaluation. Stroke team will follow up tomorrow.  Stroke Risk Factors - age  Plan: 1. HgbA1c, fasting lipid panel 2. MRI, MRA  of the brain without contrast 3. Echocardiogram 4. Carotid dopplers 5. Prophylactic therapy-aspirin 6. Risk factor modification 7. Telemetry monitoring 8. Frequent neuro checks 9. PT/OT SLP 10. NPO  Dorian Pod, MD [facility] [phone]  05/05/2015, 7:23 PM

## 2015-05-05 NOTE — Patient Instructions (Addendum)
Metformin tablets What is this medicine? METFORMIN (met FOR min) is used to treat type 2 diabetes. It helps to control blood sugar. Treatment is combined with diet and exercise. This medicine can be used alone or with other medicines for diabetes. This medicine may be used for other purposes; ask your health care provider or pharmacist if you have questions. COMMON BRAND NAME(S): Glucophage What should I tell my health care provider before I take this medicine? They need to know if you have any of these conditions: -anemia -frequently drink alcohol-containing beverages -become easily dehydrated -heart attack -heart failure that is treated with medications -kidney disease -liver disease -polycystic ovary syndrome -serious infection or injury -vomiting -an unusual or allergic reaction to metformin, other medicines, foods, dyes, or preservatives -pregnant or trying to get pregnant -breast-feeding How should I use this medicine? Take this medicine by mouth. Take it with meals. Swallow the tablets with a drink of water. Follow the directions on the prescription label. Take your medicine at regular intervals. Do not take your medicine more often than directed. Talk to your pediatrician regarding the use of this medicine in children. While this drug may be prescribed for children as young as 10 years of age for selected conditions, precautions do apply. Overdosage: If you think you have taken too much of this medicine contact a poison control center or emergency room at once. NOTE: This medicine is only for you. Do not share this medicine with others. What if I miss a dose? If you miss a dose, take it as soon as you can. If it is almost time for your next dose, take only that dose. Do not take double or extra doses. What may interact with this medicine? Do not take this medicine with any of the following medications: -dofetilide -gatifloxacin -certain contrast medicines given before X-rays,  CT scans, MRI, or other procedures This medicine may also interact with the following medications: -digoxin -diuretics -male hormones, like estrogens or progestins and birth control pills -isoniazid -medicines for blood pressure, heart disease, irregular heart beat -morphine -nicotinic acid -phenothiazines like chlorpromazine, mesoridazine, prochlorperazine, thioridazine -phenytoin -procainamide -quinidine -quinine -ranitidine -steroid medicines like prednisone or cortisone -stimulant medicines for attention disorders, weight loss, or to stay awake -thyroid medicines -trimethoprim -vancomycin This list may not describe all possible interactions. Give your health care provider a list of all the medicines, herbs, non-prescription drugs, or dietary supplements you use. Also tell them if you smoke, drink alcohol, or use illegal drugs. Some items may interact with your medicine. What should I watch for while using this medicine? Visit your doctor or health care professional for regular checks on your progress. A test called the HbA1C (A1C) will be monitored. This is a simple blood test. It measures your blood sugar control over the last 2 to 3 months. You will receive this test every 3 to 6 months. Learn how to check your blood sugar. Learn the symptoms of low and high blood sugar and how to manage them. Always carry a quick-source of sugar with you in case you have symptoms of low blood sugar. Examples include hard sugar candy or glucose tablets. Make sure others know that you can choke if you eat or drink when you develop serious symptoms of low blood sugar, such as seizures or unconsciousness. They must get medical help at once. Tell your doctor or health care professional if you have high blood sugar. You might need to change the dose of your medicine. If you are   sick or exercising more than usual, you might need to change the dose of your medicine. Do not skip meals. Ask your doctor or  health care professional if you should avoid alcohol. Many nonprescription cough and cold products contain sugar or alcohol. These can affect blood sugar. This medicine may cause ovulation in premenopausal women who do not have regular monthly periods. This may increase your chances of becoming pregnant. You should not take this medicine if you become pregnant or think you may be pregnant. Talk with your doctor or health care professional about your birth control options while taking this medicine. Contact your doctor or health care professional right away if think you are pregnant. If you are going to need surgery, a MRI, CT scan, or other procedure, tell your doctor that you are taking this medicine. You may need to stop taking this medicine before the procedure. Wear a medical ID bracelet or chain, and carry a card that describes your disease and details of your medicine and dosage times. What side effects may I notice from receiving this medicine? Side effects that you should report to your doctor or health care professional as soon as possible: -allergic reactions like skin rash, itching or hives, swelling of the face, lips, or tongue -breathing problems -feeling faint or lightheaded, falls -muscle aches or pains -signs and symptoms of low blood sugar such as feeling anxious, confusion, dizziness, increased hunger, unusually weak or tired, sweating, shakiness, cold, irritable, headache, blurred vision, fast heartbeat, loss of consciousness -slow or irregular heartbeat -unusual stomach pain or discomfort -unusually tired or weak Side effects that usually do not require medical attention (report to your doctor or health care professional if they continue or are bothersome): -diarrhea -headache -heartburn -metallic taste in mouth -nausea -stomach gas, upset This list may not describe all possible side effects. Call your doctor for medical advice about side effects. You may report side effects  to FDA at 1-800-FDA-1088. Where should I keep my medicine? Keep out of the reach of children. Store at room temperature between 15 and 30 degrees C (59 and 86 degrees F). Protect from moisture and light. Throw away any unused medicine after the expiration date. NOTE: This sheet is a summary. It may not cover all possible information. If you have questions about this medicine, talk to your doctor, pharmacist, or health care provider.  2015, Elsevier/Gold Standard. (2012-12-19 16:03:44)    Sulfamethoxazole; Trimethoprim, SMX-TMP tablets What is this medicine? SULFAMETHOXAZOLE; TRIMETHOPRIM or SMX-TMP (suhl fuh meth OK suh zohl; trye METH oh prim) is a combination of a sulfonamide antibiotic and a second antibiotic, trimethoprim. It is used to treat or prevent certain kinds of bacterial infections. It will not work for colds, flu, or other viral infections. This medicine may be used for other purposes; ask your health care provider or pharmacist if you have questions. COMMON BRAND NAME(S): Bacter-Aid DS, Bactrim, Bactrim DS, Septra, Septra DS What should I tell my health care provider before I take this medicine? They need to know if you have any of these conditions: -anemia -asthma -being treated with anticonvulsants -if you frequently drink alcohol containing drinks -kidney disease -liver disease -low level of folic acid or [id number] dehydrogenase -poor nutrition or malabsorption -porphyria -severe allergies -thyroid disorder -an unusual or allergic reaction to sulfamethoxazole, trimethoprim, sulfa drugs, other medicines, foods, dyes, or preservatives -pregnant or trying to get pregnant -breast-feeding How should I use this medicine? Take this medicine by mouth with a full glass of water. Follow  the directions on the prescription label. Take your medicine at regular intervals. Do not take it more often than directed. Do not skip doses or stop your medicine early. Talk to  your pediatrician regarding the use of this medicine in children. Special care may be needed. This medicine has been used in children as young as 2 months of age. Overdosage: If you think you have taken too much of this medicine contact a poison control center or emergency room at once. NOTE: This medicine is only for you. Do not share this medicine with others. What if I miss a dose? If you miss a dose, take it as soon as you can. If it is almost time for your next dose, take only that dose. Do not take double or extra doses. What may interact with this medicine? Do not take this medicine with any of the following medications: -aminobenzoate potassium -dofetilide -metronidazole This medicine may also interact with the following medications: -ACE inhibitors like benazepril, enalapril, lisinopril, and ramipril -birth control pills -cyclosporine -digoxin -diuretics -indomethacin -medicines for diabetes -methenamine -methotrexate -phenytoin -potassium supplements -pyrimethamine -sulfinpyrazone -tricyclic antidepressants -warfarin This list may not describe all possible interactions. Give your health care provider a list of all the medicines, herbs, non-prescription drugs, or dietary supplements you use. Also tell them if you smoke, drink alcohol, or use illegal drugs. Some items may interact with your medicine. What should I watch for while using this medicine? Tell your doctor or health care professional if your symptoms do not improve. Drink several glasses of water a day to reduce the risk of kidney problems. Do not treat diarrhea with over the counter products. Contact your doctor if you have diarrhea that lasts more than 2 days or if it is severe and watery. This medicine can make you more sensitive to the sun. Keep out of the sun. If you cannot avoid being in the sun, wear protective clothing and use a sunscreen. Do not use sun lamps or tanning beds/booths. What side effects may I  notice from receiving this medicine? Side effects that you should report to your doctor or health care professional as soon as possible: -allergic reactions like skin rash or hives, swelling of the face, lips, or tongue -breathing problems -fever or chills, sore throat -irregular heartbeat, chest pain -joint or muscle pain -pain or difficulty passing urine -red pinpoint spots on skin -redness, blistering, peeling or loosening of the skin, including inside the mouth -unusual bleeding or bruising -unusually weak or tired -yellowing of the eyes or skin Side effects that usually do not require medical attention (report to your doctor or health care professional if they continue or are bothersome): -diarrhea -dizziness -headache -loss of appetite -nausea, vomiting -nervousness This list may not describe all possible side effects. Call your doctor for medical advice about side effects. You may report side effects to FDA at 1-800-FDA-1088. Where should I keep my medicine? Keep out of the reach of children. Store at room temperature between 20 to 25 degrees C (68 to 77 degrees F). Protect from light. Throw away any unused medicine after the expiration date. NOTE: This sheet is a summary. It may not cover all possible information. If you have questions about this medicine, talk to your doctor, pharmacist, or health care provider.  2015, Elsevier/Gold Standard. (2013-04-13 14:38:26)   Urinary Tract Infection Urinary tract infections (UTIs) can develop anywhere along your urinary tract. Your urinary tract is your body's drainage system for removing wastes and extra water. Your   urinary tract includes two kidneys, two ureters, a bladder, and a urethra. Your kidneys are a pair of bean-shaped organs. Each kidney is about the size of your fist. They are located below your ribs, one on each side of your spine. CAUSES Infections are caused by microbes, which are microscopic organisms, including fungi,  viruses, and bacteria. These organisms are so small that they can only be seen through a microscope. Bacteria are the microbes that most commonly cause UTIs. SYMPTOMS  Symptoms of UTIs may vary by age and gender of the patient and by the location of the infection. Symptoms in young women typically include a frequent and intense urge to urinate and a painful, burning feeling in the bladder or urethra during urination. Older women and men are more likely to be tired, shaky, and weak and have muscle aches and abdominal pain. A fever may mean the infection is in your kidneys. Other symptoms of a kidney infection include pain in your back or sides below the ribs, nausea, and vomiting. DIAGNOSIS To diagnose a UTI, your caregiver will ask you about your symptoms. Your caregiver also will ask to provide a urine sample. The urine sample will be tested for bacteria and white blood cells. White blood cells are made by your body to help fight infection. TREATMENT  Typically, UTIs can be treated with medication. Because most UTIs are caused by a bacterial infection, they usually can be treated with the use of antibiotics. The choice of antibiotic and length of treatment depend on your symptoms and the type of bacteria causing your infection. HOME CARE INSTRUCTIONS  If you were prescribed antibiotics, take them exactly as your caregiver instructs you. Finish the medication even if you feel better after you have only taken some of the medication.  Drink enough water and fluids to keep your urine clear or pale yellow.  Avoid caffeine, tea, and carbonated beverages. They tend to irritate your bladder.  Empty your bladder often. Avoid holding urine for long periods of time.  Empty your bladder before and after sexual intercourse.  After a bowel movement, women should cleanse from front to back. Use each tissue only once. SEEK MEDICAL CARE IF:   You have back pain.  You develop a fever.  Your symptoms do not  begin to resolve within 3 days. SEEK IMMEDIATE MEDICAL CARE IF:   You have severe back pain or lower abdominal pain.  You develop chills.  You have nausea or vomiting.  You have continued burning or discomfort with urination. MAKE SURE YOU:   Understand these instructions.  Will watch your condition.  Will get help right away if you are not doing well or get worse. Document Released: 06/16/2005 Document Revised: 03/07/2012 Document Reviewed: 10/15/2011 ExitCare Patient Information 2015 [employer]. This information is not intended to replace advice given to you by your health care provider. Make sure you discuss any questions you have with your health care provider. Diabetes and Standards of Medical Care Diabetes is complicated. You may find that your diabetes team includes a dietitian, nurse, diabetes educator, eye doctor, and more. To help everyone know what is going on and to help you get the care you deserve, the following schedule of care was developed to help keep you on track. Below are the tests, exams, vaccines, medicines, education, and plans you will need. HbA1c test This test shows how well you have controlled your glucose over the past 2-3 months. It is used to see if your diabetes management   plan needs to be adjusted.   It is performed at least 2 times a year if you are meeting treatment goals.  It is performed 4 times a year if therapy has changed or if you are not meeting treatment goals. Blood pressure test  This test is performed at every routine medical visit. The goal is less than 140/90 mm Hg for most people, but 130/80 mm Hg in some cases. Ask your health care provider about your goal. Dental exam  Follow up with the dentist regularly. Eye exam  If you are diagnosed with type 1 diabetes as a child, get an exam upon reaching the age of 77 years or older and have had diabetes for 3-5 years. Yearly eye exams are recommended after that initial eye exam.  If  you are diagnosed with type 1 diabetes as an adult, get an exam within 5 years of diagnosis and then yearly.  If you are diagnosed with type 2 diabetes, get an exam as soon as possible after the diagnosis and then yearly. Foot care exam  Visual foot exams are performed at every routine medical visit. The exams check for cuts, injuries, or other problems with the feet.  A comprehensive foot exam should be done yearly. This includes visual inspection as well as assessing foot pulses and testing for loss of sensation.  Check your feet nightly for cuts, injuries, or other problems with your feet. Tell your health care provider if anything is not healing. Kidney function test (urine microalbumin)  This test is performed once a year.  Type 1 diabetes: The first test is performed 5 years after diagnosis.  Type 2 diabetes: The first test is performed at the time of diagnosis.  A serum creatinine and estimated glomerular filtration rate (eGFR) test is done once a year to assess the level of chronic kidney disease (CKD), if present. Lipid profile (cholesterol, HDL, LDL, triglycerides)  Performed every 5 years for most people.  The goal for LDL is less than 100 mg/dL. If you are at high risk, the goal is less than 70 mg/dL.  The goal for HDL is 40 mg/dL-50 mg/dL for men and 50 mg/dL-60 mg/dL for women. An HDL cholesterol of 60 mg/dL or higher gives some protection against heart disease.  The goal for triglycerides is less than 150 mg/dL. Influenza vaccine, pneumococcal vaccine, and hepatitis B vaccine  The influenza vaccine is recommended yearly.  It is recommended that people with diabetes who are over 18 years old get the pneumonia vaccine. In some cases, two separate shots may be given. Ask your health care provider if your pneumonia vaccination is up to date.  The hepatitis B vaccine is also recommended for adults with diabetes. Diabetes self-management education  Education is  recommended at diagnosis and ongoing as needed. Treatment plan  Your treatment plan is reviewed at every medical visit. Document Released: 07/04/2009 Document Revised: 01/21/2014 Document Reviewed: 02/06/2013 [facility] Patient Information 2015 [place], [place]. This information is not intended to replace advice given to you by your health care provider. Make sure you discuss any questions you have with your health care provider.

## 2015-05-05 NOTE — ED Notes (Signed)
CBG 204. 

## 2015-05-05 NOTE — Telephone Encounter (Signed)
Received phone call from reading radiologist regarding head ct results showing subacute left sided infarct. Called and informed pt. Instructed him to present asap to [facility] ER for further w/u. Pt agreeable and his wife who is with him will drive him. Called and spoke with Zacarias Pontes charge RN on duty who is aware of pts planned arrival.

## 2015-05-05 NOTE — H&P (Signed)
[facility] History and Physical  Trevor Wheeler KGM:[id number] DOB: July 14, 1946 DOA: 05/05/2015  Referring physician: Dr Johnney Killian PCP: Nyoka Cowden, MD   Chief Complaint: Unsteady on feet, nausea, vomiting  HPI: Trevor Wheeler is a 69 y.o. male with no chronic medical conditions who presents with unsteady gait/ loss of balance, nausea and vomiting which started abruptly when he woke up yesterday morning. His wife convinced him to come to the ED today where CT showed L cerebellar CVA.  MRI confirms.    Patient smoked < 10 years, quit 1970''s.  Works in Theatre manager for [employer], no etoh, no hx HTN/ DM and doesn't take Rx medication. His PCP recently told him about a "borderline" high BP possibility.    Chart review: none  ROS  no abd pain, no CP, no HA  no joint pain  no diarrhea  no fevers, chills sweats   Where does patient live home  Can patient participate in ADLs? yes  Past Medical History History reviewed. No pertinent past medical history. Past Surgical History History reviewed. No pertinent past surgical history. Family History  Family History  Problem Relation Age of Onset  . Heart disease Mother   . Cancer Brother   . Heart disease Brother     Social History  reports that he has quit smoking. He does not have any smokeless tobacco history on file. He reports that he does not drink alcohol or use illicit drugs. Allergies  Allergies  Allergen Reactions  . Penicillins Hives   Home medications Prior to Admission medications   Medication Sig Start Date End Date Taking? Authorizing Provider  metFORMIN (GLUCOPHAGE) 1000 MG tablet Take 1 tablet (1,000 mg total) by mouth daily after breakfast. 05/05/15  Yes Thao P Le, DO  naproxen sodium (ANAPROX) 220 MG tablet Take 220 mg by mouth every 12 (twelve) hours as needed (knee pain).   Yes Historical Provider, MD  sulfamethoxazole-trimethoprim (BACTRIM DS,SEPTRA DS) 800-160 MG per tablet Take 1  tablet by mouth 2 (two) times daily. 05/05/15  Yes Thao P Le, DO   Liver Function Tests  Recent Labs Lab 05/05/15 1602  AST 32  ALT 40  ALKPHOS 86  BILITOT 0.6  PROT 7.2  ALBUMIN 3.9   No results for input(s): LIPASE, AMYLASE in the last 168 hours. CBC  Recent Labs Lab 05/05/15 1329 05/05/15 1602  WBC 11.0* 10.3  NEUTROABS  --  5.8  HGB 14.8 14.8  HCT 48.6 46.0  MCV 83.2 85.2  PLT  --  644   Basic Metabolic Panel  Recent Labs Lab 05/05/15 1602  NA 137  K 3.8  CL 105  CO2 24  GLUCOSE 217*  BUN 8  CREATININE 0.82  CALCIUM 9.1    EKG: Independently reviewed > NSR, normal EKG  CXR: independently reviewed > pending  Filed Vitals:   05/05/15 1900 05/05/15 1931 05/05/15 1945 05/05/15 2000  BP: 145/83 148/85 139/73 113/79  Pulse: 77 82 84 81  Temp:      TempSrc:      Resp: 15 15 17 15   Height:      Weight:      SpO2: 98% 98% 98% 98%   Exam: Alert heavy set AAM no distress No rash, cyanosis or gangrene Sclera anicteric, throat clear No jvd or bruits Chest clear throughout RRR no MRG Abd obese, soft ntnd no ascites or HSM GU normal male Ext no LE edema, wounds, pedal pulses intact Neuro is alert, Ox 3, nonfocal motor  exam. No sensory deficit. Observed gait briefly transferring and was wide-based and unsteady.  FTN/ HTS testing wnl. CN's grossly intact, VF's intact  Na 137 Creat 0.82  Hb 14  WBC 10k  plt 215   Alb 2.9  lft's wnl  Ca 9.1 UA negative except for 3+ glucose BS 215   Assessment: 1. Acute left cerebellar CVA - w gait disturbance/ nausea/ vomiting/ spatial disorientation. No prior CVA history, no afib, no chronic medical conditions, no prescription medications. Plan admit, work-up per neurology to include MRI/ MRA, echo, carotid dopplers. Started on ASA. Monitor on telemetry. PT/OT/ SLP consults.   2. Hyperglycemia - new, BS 215. No history of DM. Plan accuchecks.  Carb mod diet for now.   Plan - as above    DVT Prophylaxis SCD's( no  lovenox per neuro) Code Status: full  Family Communication: w daughters and wife at bedside  Disposition Plan: pending work up for stroke    Sol Blazing [facility] Pager [phone]  If 7PM-7AM, please contact night-coverage [email or web address] Password [email or web address] 05/05/2015, 9:10 PM

## 2015-05-06 ENCOUNTER — Other Ambulatory Visit (HOSPITAL_COMMUNITY): Payer: BC Managed Care – PPO

## 2015-05-06 ENCOUNTER — Inpatient Hospital Stay (HOSPITAL_COMMUNITY): Payer: Medicare Other

## 2015-05-06 ENCOUNTER — Encounter (HOSPITAL_COMMUNITY): Payer: BC Managed Care – PPO

## 2015-05-06 ENCOUNTER — Encounter (HOSPITAL_COMMUNITY): Payer: Self-pay

## 2015-05-06 DIAGNOSIS — R29818 Other symptoms and signs involving the nervous system: Secondary | ICD-10-CM

## 2015-05-06 DIAGNOSIS — E785 Hyperlipidemia, unspecified: Secondary | ICD-10-CM | POA: Diagnosis present

## 2015-05-06 DIAGNOSIS — I639 Cerebral infarction, unspecified: Secondary | ICD-10-CM | POA: Insufficient documentation

## 2015-05-06 DIAGNOSIS — E1100 Type 2 diabetes mellitus with hyperosmolarity without nonketotic hyperglycemic-hyperosmolar coma (NKHHC): Secondary | ICD-10-CM

## 2015-05-06 DIAGNOSIS — E1165 Type 2 diabetes mellitus with hyperglycemia: Secondary | ICD-10-CM | POA: Diagnosis present

## 2015-05-06 DIAGNOSIS — IMO0002 Reserved for concepts with insufficient information to code with codable children: Secondary | ICD-10-CM | POA: Diagnosis present

## 2015-05-06 DIAGNOSIS — I6340 Cerebral infarction due to embolism of unspecified cerebral artery: Secondary | ICD-10-CM | POA: Diagnosis not present

## 2015-05-06 LAB — LIPID PANEL
CHOL/HDL RATIO: 7.6 ratio
Cholesterol: 219 mg/dL — ABNORMAL HIGH (ref 0–200)
HDL: 29 mg/dL — AB (ref >40)
LDL CALC: 139 mg/dL — AB (ref 0–99)
Triglycerides: 255 mg/dL — ABNORMAL HIGH (ref <150)
VLDL: 51 mg/dL — ABNORMAL HIGH (ref 0–40)

## 2015-05-06 LAB — GLUCOSE, CAPILLARY
GLUCOSE-CAPILLARY: 191 mg/dL — AB (ref 65–99)
GLUCOSE-CAPILLARY: 170 mg/dL — AB (ref 65–99)
Glucose-Capillary: 132 mg/dL — ABNORMAL HIGH (ref 65–99)

## 2015-05-06 LAB — HEMOGLOBIN A1C
Hgb A1c MFr Bld: 8.1 % — ABNORMAL HIGH (ref 4.8–5.6)
Mean Plasma Glucose: 186 mg/dL

## 2015-05-06 MED ORDER — ATORVASTATIN CALCIUM 20 MG PO TABS
20.0000 mg | ORAL_TABLET | Freq: Every day | ORAL | Status: DC
  Administered 2015-05-06 – 2015-05-07 (×2): 20 mg via ORAL
  Filled 2015-05-06 (×2): qty 1

## 2015-05-06 MED ORDER — IOHEXOL 350 MG/ML SOLN
50.0000 mL | Freq: Once | INTRAVENOUS | Status: AC | PRN
  Administered 2015-05-06: 50 mL via INTRAVENOUS

## 2015-05-06 MED ORDER — LIVING WELL WITH DIABETES BOOK
Freq: Once | Status: AC
  Administered 2015-05-06: 13:00:00
  Filled 2015-05-06: qty 1

## 2015-05-06 MED ORDER — GLIPIZIDE 5 MG PO TABS
2.5000 mg | ORAL_TABLET | Freq: Every day | ORAL | Status: DC
  Administered 2015-05-07 – 2015-05-08 (×2): 2.5 mg via ORAL
  Filled 2015-05-06 (×2): qty 1

## 2015-05-06 MED ORDER — HEPARIN SODIUM (PORCINE) 5000 UNIT/ML IJ SOLN
5000.0000 [IU] | Freq: Three times a day (TID) | INTRAMUSCULAR | Status: DC
  Administered 2015-05-06 – 2015-05-08 (×6): 5000 [IU] via SUBCUTANEOUS
  Filled 2015-05-06 (×6): qty 1

## 2015-05-06 MED ORDER — INSULIN ASPART 100 UNIT/ML ~~LOC~~ SOLN
0.0000 [IU] | Freq: Three times a day (TID) | SUBCUTANEOUS | Status: DC
  Administered 2015-05-06: 1 [IU] via SUBCUTANEOUS
  Administered 2015-05-06 – 2015-05-07 (×3): 2 [IU] via SUBCUTANEOUS
  Administered 2015-05-08: 1 [IU] via SUBCUTANEOUS
  Administered 2015-05-08: 2 [IU] via SUBCUTANEOUS
  Filled 2015-05-06 (×2): qty 1

## 2015-05-06 MED ORDER — ASPIRIN EC 325 MG PO TBEC
325.0000 mg | DELAYED_RELEASE_TABLET | Freq: Every day | ORAL | Status: DC
Start: 2015-05-07 — End: 2015-05-08
  Administered 2015-05-07 – 2015-05-08 (×2): 325 mg via ORAL
  Filled 2015-05-06 (×2): qty 1

## 2015-05-06 NOTE — Care Management Note (Signed)
Case Management Note  Patient Details  Name: Zaydon Kinser MRN: [id number] Date of Birth: 1945-10-23  Subjective/Objective:                 Spoke with patient and wife at bedside. Patient is currently employed at [employer]. Patient does not have a PCP but does have private insurance, received Health connect number to find MD who accepts his insurance. Patient is newly diagnosed as diabetic, is to receive diabetic teaching today. CM to follow medications on discharge to provide assistance if possible. Patient does not currently use any walking assistive devices, will await PT recommendation. Anticipate DC to home with wife.   Action/Plan:  Will continue to follow and offer resources and [facility] as needed.  Expected Discharge Date:                  Expected Discharge Plan:  [place]  In-House Referral:  Clinical Social Work  Discharge planning Services  CM Consult  Post Acute Care Choice:    Choice offered to:     DME Arranged:    DME Agency:     HH Arranged:    [place] Agency:     Status of Service:  In process, will continue to follow  Medicare Important Message Given:    Date Medicare IM Given:    Medicare IM give by:    Date Additional Medicare IM Given:    Additional Medicare Important Message give by:     If discussed at [place] of Stay Meetings, dates discussed:    Additional Comments:  Carles Collet, RN 05/06/2015, 11:54 AM

## 2015-05-06 NOTE — Progress Notes (Addendum)
Inpatient Diabetes Program Recommendations  AACE/ADA: New Consensus Statement on Inpatient Glycemic Control (2013)  Target Ranges:  Prepandial:   less than 140 mg/dL      Peak postprandial:   less than 180 mg/dL (1-2 hours)      Critically ill patients:  140 - 180 mg/dL   Consult received for DM teaching. HgbA1C at 7.5%. Home on metformin. Here started on glipizide as well. Pt is documented as having a primary MD with [facility]. Will order education using system ed network videos on dm, will order dietician consult, and ed booklet. Will talk with patient to assess ed'l needs as well. Inpatient Diabetes Program Recommendations Insulin - Basal: Please start basal lantus at 10 units daily or HS while here.   Will order Diabetes OP education if appropriate as well.  Thank you Rosita Kea, RN, MSN, CDE  Diabetes Inpatient Program Office: [phone] Pager: [phone] 8:00 am to 5:00 pm

## 2015-05-06 NOTE — Plan of Care (Signed)
Problem: Food- and Nutrition-Related Knowledge Deficit (NB-1.1) Goal: Nutrition education Formal process to instruct or train a patient/client in a skill or to impart knowledge to help patients/clients voluntarily manage or modify food choices and eating behavior to maintain or improve health. Outcome: Adequate for Discharge  RD consulted for nutrition education regarding diabetes.     Lab Results  Component Value Date    HGBA1C 8.1* 05/05/2015    RD provided "Carbohydrate Counting for People with Diabetes" handout from the Academy of Nutrition and Dietetics. Discussed different food groups and their effects on blood sugar, emphasizing carbohydrate-containing foods. Provided list of carbohydrates and recommended serving sizes of common foods.  Discussed importance of controlled and consistent carbohydrate intake throughout the day. Provided examples of ways to balance meals/snacks and encouraged intake of high-fiber, whole grain complex carbohydrates. Teach back method used.  Expect fair to good compliance.  Body mass index is 32.88 kg/(m^2). Pt meets criteria for obesity, class I based on current BMI.  Current diet order is Heart Healthy/ Carb Modified, patient is consuming approximately 100% of meals at this time. Labs and medications reviewed. No further nutrition interventions warranted at this time. RD contact information provided. If additional nutrition issues arise, please re-consult RD.  Trevor Wheeler A. Jimmye Norman, RD, LDN, CDE Pager: [phone] After hours Pager: [phone]

## 2015-05-06 NOTE — Progress Notes (Signed)
Utilization Review completed. Lalla Laham RN BSN CM 

## 2015-05-06 NOTE — Progress Notes (Signed)
STROKE TEAM PROGRESS NOTE   HISTORY Trevor Wheeler is an 69 y.o. male with apparently non relevant past medical history, comes in for further evaluation of acute ataxia. He is accompanied by hius wife. Trevor Wheeler stated that he never head similar symptoms before, but yesterday morning he woke up feeling very off balance and walking was a daunting task. Her expressed that " I had to manage to walk, c\ouldn't stand without falling over, and had to hold to the walls". Said that he vomited x 1 after taking a tablet of Aleve and had to stay in bed the whole day yesterday due to extreme imbalance. Denied associated HA, vertigo, double vision, difficulty swallowing, focal weakness or numbness, confusion, slurred speech, language or vision impairment. He presented to urgent care where he had a CT brain that showed an ill-defined low-density throughout the inferior medial left cerebellum consistent with an early subacute nonhemorrhagic infarct in the PICA distribution. He was last known well 05/03/15 at 9 pm. Patient was not administered TPA secondary to late presentation. He was admitted for further evaluation and treatment.   SUBJECTIVE (INTERVAL HISTORY) His wife is at the bedside.  Overall he feels his condition is completely resolved. However, due to bilateral cerebellar infarcts, embolic work up indicated. Pt agrees with CTA neck and TEE.    OBJECTIVE Temp:  [97.7 F (36.5 C)-99.7 F (37.6 C)] 99.1 F (37.3 C) (08/16 1046) Pulse Rate:  [75-91] 91 (08/16 1046) Cardiac Rhythm:  [-] Normal sinus rhythm (08/16 0410) Resp:  [15-18] 18 (08/16 0608) BP: (113-179)/(67-100) 159/95 mmHg (08/16 1046) SpO2:  [96 %-100 %] 99 % (08/16 1046) Weight:  [106.9 kg (235 lb 10.8 oz)-107.956 kg (238 lb)] 106.9 kg (235 lb 10.8 oz) (08/15 2158)   Recent Labs Lab 05/05/15 1549 05/05/15 1726 05/05/15 2205 05/06/15 0742  GLUCAP 195* 204* 139* 191*    Recent Labs Lab 05/05/15 1320 05/05/15 1602  NA 141 137  K  4.3 3.8  CL 103 105  CO2 27 24  GLUCOSE 165* 217*  BUN 9 8  CREATININE 0.88 0.82  CALCIUM 10.0 9.1    Recent Labs Lab 05/05/15 1320 05/05/15 1602  AST 25 32  ALT 42 40  ALKPHOS 97 86  BILITOT 0.6 0.6  PROT 7.7 7.2  ALBUMIN 4.6 3.9    Recent Labs Lab 05/05/15 1329 05/05/15 1602  WBC 11.0* 10.3  NEUTROABS  --  5.8  HGB 14.8 14.8  HCT 48.6 46.0  MCV 83.2 85.2  PLT  --  215   No results for input(s): CKTOTAL, CKMB, CKMBINDEX, TROPONINI in the last 168 hours.  Recent Labs  05/05/15 1602  LABPROT 14.2  INR 1.08    Recent Labs  05/05/15 1329  BILIRUBINUR neg  PROTEINUR trace  UROBILINOGEN 0.2  NITRITE positive  LEUKOCYTESUR Negative       Component Value Date/Time   CHOL 219* 05/06/2015 0056   TRIG 255* 05/06/2015 0056   HDL 29* 05/06/2015 0056   CHOLHDL 7.6 05/06/2015 0056   VLDL 51* 05/06/2015 0056   LDLCALC 139* 05/06/2015 0056   Lab Results  Component Value Date   HGBA1C 8.1* 05/05/2015   No results found for: LABOPIA, COCAINSCRNUR, LABBENZ, AMPHETMU, THCU, LABBARB  No results for input(s): ETH in the last 168 hours.   IMAGING  Ct Head Wo Contrast 05/05/2015   Early subacute nonhemorrhagic left PICA distribution infarct.    Trevor Wheeler Head Wo Contrast 05/05/2015   The RIGHT vertebral is the dominant contributor  to the posterior circulation, but the LEFT is patent without clear intrinsic abnormality. The origin of the LEFT PICA is visualized and is normal, but the distal ramifications of that branch are poorly seen, and likely thrombosed.     Trevor Brain Wo Contrast 05/05/2015    Moderate-sized acute LEFT PICA territory infarct affecting the LEFT inferior cerebellum and LEFT inferior vermis and tonsil. Tiny area of acute infarction affecting medial cerebellum on the RIGHT.    Dg Chest Port 1 View 05/05/2015    No active disease.     Ct Angio Neck W/cm &/or Wo/cm  05/06/2015   IMPRESSION: No hemodynamically significant carotid or vertebral artery  stenosis in the neck, as detailed above.  Dominant right vertebral artery. Left greater than right carotid atherosclerosis.      2D echo - pending   PHYSICAL EXAM  Temp:  [97.9 F (36.6 C)-99.7 F (37.6 C)] 98.8 F (37.1 C) (08/16 2119) Pulse Rate:  [79-91] 81 (08/16 2119) Resp:  [15-20] 20 (08/16 2119) BP: (121-166)/(67-95) 136/75 mmHg (08/16 2119) SpO2:  [97 %-99 %] 97 % (08/16 2119) Weight:  [235 lb 10.8 oz (106.9 kg)] 235 lb 10.8 oz (106.9 kg) (08/15 2158)  General - Well nourished, well developed, in no apparent distress.  Ophthalmologic - Sharp disc margins OU.   Cardiovascular - Regular rate and rhythm with no murmur.  Mental Status -  Level of arousal and orientation to time, place, and person were intact. Language including expression, naming, repetition, comprehension was assessed and found intact. Fund of Knowledge was assessed and was intact.  Cranial Nerves II - XII - II - Visual field intact OU. III, IV, VI - Extraocular movements intact. V - Facial sensation intact bilaterally. VII - Facial movement intact bilaterally. VIII - Hearing & vestibular intact bilaterally. X - Palate elevates symmetrically. XI - Chin turning & shoulder shrug intact bilaterally. XII - Tongue protrusion intact.  Motor Strength - The patient's strength was normal in all extremities and pronator drift was absent.  Bulk was normal and fasciculations were absent.   Motor Tone - Muscle tone was assessed at the neck and appendages and was normal.  Reflexes - The patient's reflexes were 1+ in all extremities and he had no pathological reflexes.  Sensory - Light touch, temperature/pinprick were assessed and were symmetrical.    Coordination - The patient had normal movements in the hands and feet with no ataxia or dysmetria.  Tremor was absent.  Gait and Station - The patient's transfers, posture, gait, station, and turns were observed as normal.   ASSESSMENT/PLAN Trevor Wheeler is  a 69 y.o. male with no significant past medical history presenting with acute ataxia. He did not receive IV t-PA due to delay in arrival.   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   MRA  L PCA likely thrombosed  CTA of neck unremarkable, right VA dominant and L VA diminutive   2D Echo  pending  TEE to look for embolic source. Arranged with [facility] for tomorrow.  If positive for PFO (patent foramen ovale), check bilateral lower extremity venous dopplers to rule out DVT as possible source of stroke. (I have made patient NPO after midnight tonight). If TEE negative, a [facility] electrophysiologist will consult and consider placement of an  implantable loop recorder to evaluate for atrial fibrillation as etiology of stroke. This has been explained to patient/family by Dr. Erlinda Hong and they are agreeable.   LDL 139  HgbA1c 8.1  SCDs for VTE prophylaxis Diet heart healthy/carb modified Room service appropriate?: Yes; Fluid consistency:: Thin  no antithrombotic prior to admission, now on aspirin 81 mg orally every day  Patient counseled to be compliant with his antithrombotic medications  Ongoing aggressive stroke risk factor management  Therapy recommendations:  Pending  Disposition:  Anticipate return home  Hyperlipidemia  Home meds:  No statin  LDL 139, goal < 70  New lipitor 20 mg daily added  Continue statin at discharge  Type 2 Diabetes, new diagnosis  No history of diabetes per pt  HgbA1c 8.1, goal < 7.0  Uncontrolled  DB RN has seen and recommended treatment and discharge education, followup  Other Stroke Risk Factors  Advanced age  Former Cigarette smoker  Obesity, Body mass index is 32.88 kg/(m^2).   Hospital day # [facility] for Pager information 05/06/2015 11:20 AM   I, the attending vascular neurologist, have personally obtained a history, examined the patient, evaluated laboratory data, individually viewed imaging studies and agree with radiology interpretations. Together with the NP/PA, we formulated the assessment and plan of care which reflects our mutual decision.  I have made any additions or clarifications directly to the above note and agree with the findings and plan as currently documented.   69 yo M with no PMH admitted for bilateral cerebellar infarcts, L>R, embolic pattern. Stroke work up showed unremarkable CTA, but LDL 139 and A1C 8.1, indicating HLD and DM. Will do TEE and loop to rule out cardiac SOE and afib. 2D echo pending, continue ASA and statin.   Rosalin Hawking, MD PhD Stroke Neurology 05/06/2015 9:43 PM     To contact Stroke Continuity provider, please refer to [email or web address] After hours, contact General Neurology

## 2015-05-06 NOTE — Evaluation (Signed)
Physical Therapy Evaluation Patient Details Name: Trevor Wheeler MRN: [id number] DOB: 06-24-1946 Today's Date: 05/06/2015   History of Present Illness  69 yo male who has instability with gait and nausea pre-admit, dx with L inferior and R medial cerebellar strokes.  Clinical Impression  Pt was seen for evaluation of his functional status and only needs assistance for higher challenges.  He and wife are in agreement with outpatient therapy and deferred questions about driving to MD.  He will remain on PT for follow up to increase safety    Follow Up Recommendations Outpatient PT;Supervision - Intermittent    Equipment Recommendations  None recommended by PT    Recommendations for Other Services       Precautions / Restrictions Precautions Precautions: Fall;Other (comment) (telemetry) Restrictions Weight Bearing Restrictions: No      Mobility  Bed Mobility               General bed mobility comments: up when PT entered  Transfers Overall transfer level: Modified independent Equipment used: None                Ambulation/Gait Ambulation/Gait assistance: Supervision;Min guard (for safety) Ambulation Distance (Feet): 250 Feet Assistive device: 1 person hand held assist Gait Pattern/deviations: Step-through pattern;Wide base of support Gait velocity: normal Gait velocity interpretation: at or above normal speed for age/gender General Gait Details: pt stabilizes with wide base  Stairs Stairs: Yes Stairs assistance: Min guard Stair Management: One rail Right;Forwards;Alternating pattern (R up and down) Number of Stairs: 24 General stair comments: min guard for safety but no LOB  Wheelchair Mobility    Modified Rankin (Stroke Patients Only) Modified Rankin (Stroke Patients Only) Pre-Morbid Rankin Score: No symptoms Modified Rankin: Slight disability     Balance Overall balance assessment: Needs assistance Sitting-balance support: Feet  supported Sitting balance-Leahy Scale: Good     Standing balance support: Single extremity supported (min guard) Standing balance-Leahy Scale: Fair               High level balance activites: Backward walking;Other (comment) (pt needs min to mod assist with tandem walking and back) High Level Balance Comments: min assist to control more than steps and gait on hallway             Pertinent Vitals/Pain Pain Assessment: No/denies pain    Home Living Family/patient expects to be discharged to:: Private residence Living Arrangements: Spouse/significant other;Children Available Help at Discharge: Family;Available 24 hours/day Type of Home: House Home Access: Stairs to enter Entrance Stairs-Rails: Right;Left;Can reach both Entrance Stairs-Number of Steps: 10 Home Layout: Two level Home Equipment: None Additional Comments: was working and independent, drives    Prior Function Level of Independence: Independent               Hand Dominance   Dominant Hand: Right    Extremity/Trunk Assessment   Upper Extremity Assessment: Overall WFL for tasks assessed           Lower Extremity Assessment: Generalized weakness      Cervical / Trunk Assessment: Normal  Communication   Communication: No difficulties  Cognition Arousal/Alertness: Awake/alert Behavior During Therapy: WFL for tasks assessed/performed Overall Cognitive Status: Within Functional Limits for tasks assessed                      General Comments General comments (skin integrity, edema, etc.): Pt was able to walk on level ground and manage fine with railing for steps but with high level challenges  are an issue.  Plan for outpatient which pt and his wife agree to doing for avoiding fall risk     Exercises        Assessment/Plan    PT Assessment Patient needs continued PT services  PT Diagnosis Abnormality of gait   PT Problem List Decreased activity tolerance;Decreased  balance;Decreased coordination;Decreased mobility;Decreased safety awareness  PT Treatment Interventions DME instruction;Gait training;Functional mobility training;Therapeutic activities;Therapeutic exercise;Balance training;Neuromuscular re-education;Patient/family education   PT Goals (Current goals can be found in the Care Plan section) Acute Rehab PT Goals Patient Stated Goal: to ge tback to work PT Goal Formulation: With patient/family Time For Goal Achievement: 05/20/15 Potential to Achieve Goals: Good    Frequency Min 5X/week   Barriers to discharge   Wife is available to assist him    Co-evaluation               End of Session   Activity Tolerance: Patient tolerated treatment well;Other (comment) (balance due to stroke) Patient left: in chair;with call bell/phone within reach;with family/visitor present Nurse Communication: Mobility status         Time: 2094-7096 PT Time Calculation (min) (ACUTE ONLY): 22 min   Charges:   PT Evaluation $Initial PT Evaluation Tier I: 1 Procedure     PT G CodesRamond Wheeler 30-May-2015, 11:45 AM   Trevor Wheeler, PT MS Acute Rehab Dept. Number: [phone] [id number] and [place] [phone]

## 2015-05-06 NOTE — Progress Notes (Signed)
Patient Demographics:    Trevor Wheeler, is a 69 y.o. male, DOB - 08-12-1946, URK:[id number]  Admit date - 05/05/2015   Admitting Physician Roney Jaffe, MD  Outpatient Primary MD for the patient is No PCP Per Patient  LOS - 1   Chief Complaint  Patient presents with  . Dizziness  . Altered Mental Status        Subjective:    [name] today has, No headache, No chest pain, No abdominal pain - No Nausea, No new weakness tingling or numbness, No Cough - SOB.     Assessment  & Plan :     1. Posterior circulation left cerebellar CVA. on tele, obtain PT, OT, speech input, neuro following and currently on aspirin and placed on Lipitor. Does have evidence of undiagnosed diabetes, poorly controlled cholesterol.  Echo and CT angiogram head and neck pending. Pending PT OT speech eval. Neuro to follow.  Lab Results  Component Value Date   HGBA1C 8.1* 05/05/2015    Lab Results  Component Value Date   CHOL 219* 05/06/2015   HDL 29* 05/06/2015   LDLCALC 139* 05/06/2015   LDLDIRECT 182.2 08/05/2010   TRIG 255* 05/06/2015   CHOLHDL 7.6 05/06/2015     2. Undiagnosed dyslipidemia. Placed on Lipitor 20. Goal LDL under 70.   3. DM type II undiagnosed. Will place on Glucotrol along with sliding scale, diabetic education requested.  Lab Results  Component Value Date   HGBA1C 8.1* 05/05/2015    CBG (last 3)   Recent Labs  05/05/15 1726 05/05/15 2205 05/06/15 0742  GLUCAP 204* 139* 191*       Code Status : Full  Family Communication  : None  Disposition Plan  : Home tomorrow  Consults  :  Neuro  Procedures  : TTE, CTA head, MRI Brain  DVT Prophylaxis  :   - Heparin   Lab Results  Component Value Date   PLT 215 05/05/2015    Inpatient Medications  Scheduled Meds: .  aspirin EC  81 mg Oral Daily  . pneumococcal 23 valent vaccine  0.5 mL Intramuscular Tomorrow-1000   Continuous Infusions:  PRN Meds:.senna-docusate  Antibiotics  :     Anti-infectives    None        Objective:   Filed Vitals:   05/06/15 0222 05/06/15 0410 05/06/15 0608 05/06/15 0741  BP: 140/69 121/67 151/81 136/83  Pulse: 79 84 81 82  Temp: 99.3 F (37.4 C) 99.5 F (37.5 C) 97.9 F (36.6 C) 99.3 F (37.4 C)  TempSrc: Oral Oral Oral Oral  Resp: 15 17 18    Height:      Weight:      SpO2: 97% 99% 97% 98%    Wt Readings from Last 3 Encounters:  05/05/15 106.9 kg (235 lb 10.8 oz)  05/05/15 107.956 kg (238 lb)  08/12/10 105.235 kg (232 lb)     Intake/Output Summary (Last 24 hours) at 05/06/15 1005 Last data filed at 05/06/15 0943  Gross per 24 hour  Intake    480 ml  Output    750 ml  Net   -270 ml     Physical Exam  Awake Alert, Oriented X 3, No new F.N deficits, Normal affect [place].AT,PERRAL  Supple Neck,No JVD, No cervical lymphadenopathy appriciated.  Symmetrical Chest wall movement, Good air movement bilaterally, CTAB RRR,No Gallops,Rubs or new Murmurs, No Parasternal Heave +ve B.Sounds, Abd Soft, No tenderness, No organomegaly appriciated, No rebound - guarding or rigidity. No Cyanosis, Clubbing or edema, No new Rash or bruise       Data Review:   Micro Results No results found for this or any previous visit (from the past 240 hour(s)).  Radiology Reports Ct Head Wo Contrast  05/05/2015   CLINICAL DATA:  Dizziness for 1 day. New onset diabetes and elevated blood pressure. Initial encounter.  EXAM: CT HEAD WITHOUT CONTRAST  TECHNIQUE: Contiguous axial images were obtained from the base of the skull through the vertex without intravenous contrast.  COMPARISON:  None.  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 No evidence of  acute intracranial hemorrhage, mass lesion, extra-axial fluid collection or midline shift. There is no hydrocephalus or additional evidence of acute stroke. Intracranial vascular calcifications are noted.  Minimal left ethmoid sinus mucosal thickening. The visualized paranasal sinuses, mastoid air cells and middle ears are otherwise clear. The calvarium is intact.  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  These results were called by telephone at the time of interpretation on 05/05/2015 at 3:06 pm to [place], [place] for Dr. Rikki Spearing , who verbally acknowledged these results.   Electronically Signed   By: Richardean Sale M.D.   On: 05/05/2015 15:09   [name] Head Wo Contrast  05/05/2015   CLINICAL DATA:  Dizziness for 1 day. New onset of diabetes and elevated blood pressure.  EXAM: MRI HEAD WITHOUT CONTRAST  MRA HEAD WITHOUT CONTRAST  TECHNIQUE: Multiplanar, multiecho pulse sequences of the brain and surrounding structures were obtained without intravenous contrast. Angiographic images of the head were obtained using MRA technique without contrast.  COMPARISON:  CT head earlier today.  FINDINGS: MRI HEAD FINDINGS  There is a moderate-sized acute LEFT posterior inferior cerebellar artery territory infarct affecting the LEFT inferior cerebellum and LEFT inferior vermis and tonsil. There is a tiny area of acute infarction affecting the medial cerebellum inferiorly on the RIGHT. No brainstem infarction. No cerebral hemispheric infarction.  No MR evidence for acute hemorrhage.  No incipient hydrocephalus or tonsillar herniation. No intracranial mass lesion or extra-axial fluid.  Mild cerebral and cerebellar atrophy. Minor white matter disease, nonspecific.  Flow voids are maintained in the major vessels.  No midline abnormality.  Extracranial soft tissues unremarkable.  Compared with prior CT, good general agreement.  MRA HEAD FINDINGS  The internal carotid arteries are widely patent but  dolichoectatic.  Vertebrobasilar system is dolichoectatic but also patent. RIGHT vertebral dominant. LEFT vertebral mildly irregular in its distal segment, not clearly diseased. The origin of the LEFT PICA appears normal, but the distal ramifications of that vessel are not well seen.  No intracranial stenosis or aneurysm. No proximal large vessel occlusion.  IMPRESSION: Moderate-sized acute LEFT PICA territory infarct affecting the LEFT inferior cerebellum and LEFT inferior vermis and tonsil. Tiny area of acute infarction affecting medial cerebellum on the RIGHT.  The RIGHT vertebral is the dominant contributor to the posterior circulation, but the LEFT is patent without clear intrinsic abnormality. The origin of the LEFT PICA is visualized and is normal, but the distal ramifications of that branch are poorly seen, and likely thrombosed.   Electronically Signed   By: Roderic Ovens.D.  On: 05/05/2015 21:34   Mr Brain Wo Contrast  05/05/2015   CLINICAL DATA:  Dizziness for 1 day. New onset of diabetes and elevated blood pressure.  EXAM: MRI HEAD WITHOUT CONTRAST  MRA HEAD WITHOUT CONTRAST  TECHNIQUE: Multiplanar, multiecho pulse sequences of the brain and surrounding structures were obtained without intravenous contrast. Angiographic images of the head were obtained using MRA technique without contrast.  COMPARISON:  CT head earlier today.  FINDINGS: MRI HEAD FINDINGS  There is a moderate-sized acute LEFT posterior inferior cerebellar artery territory infarct affecting the LEFT inferior cerebellum and LEFT inferior vermis and tonsil. There is a tiny area of acute infarction affecting the medial cerebellum inferiorly on the RIGHT. No brainstem infarction. No cerebral hemispheric infarction.  No MR evidence for acute hemorrhage.  No incipient hydrocephalus or tonsillar herniation. No intracranial mass lesion or extra-axial fluid.  Mild cerebral and cerebellar atrophy. Minor white matter disease, nonspecific.  Flow  voids are maintained in the major vessels.  No midline abnormality.  Extracranial soft tissues unremarkable.  Compared with prior CT, good general agreement.  MRA HEAD FINDINGS  The internal carotid arteries are widely patent but dolichoectatic.  Vertebrobasilar system is dolichoectatic but also patent. RIGHT vertebral dominant. LEFT vertebral mildly irregular in its distal segment, not clearly diseased. The origin of the LEFT PICA appears normal, but the distal ramifications of that vessel are not well seen.  No intracranial stenosis or aneurysm. No proximal large vessel occlusion.  IMPRESSION: Moderate-sized acute LEFT PICA territory infarct affecting the LEFT inferior cerebellum and LEFT inferior vermis and tonsil. Tiny area of acute infarction affecting medial cerebellum on the RIGHT.  The RIGHT vertebral is the dominant contributor to the posterior circulation, but the LEFT is patent without clear intrinsic abnormality. The origin of the LEFT PICA is visualized and is normal, but the distal ramifications of that branch are poorly seen, and likely thrombosed.   Electronically Signed   By: Staci Righter M.D.   On: 05/05/2015 21:34   Dg Chest Port 1 View  05/05/2015   CLINICAL DATA:  Acute CVA  EXAM: PORTABLE CHEST - 1 VIEW  COMPARISON:  None.  FINDINGS: Normal heart size and mediastinal contours. No acute infiltrate or edema. No effusion or pneumothorax. No acute osseous findings.  IMPRESSION: No active disease.   Electronically Signed   By: Monte Fantasia M.D.   On: 05/05/2015 21:51     CBC  Recent Labs Lab 05/05/15 1329 05/05/15 1602  WBC 11.0* 10.3  HGB 14.8 14.8  HCT 48.6 46.0  PLT  --  215  MCV 83.2 85.2  MCH 25.3* 27.4  MCHC 30.4* 32.2  RDW  --  13.4  LYMPHSABS  --  3.7  MONOABS  --  0.7  EOSABS  --  0.1  BASOSABS  --  0.0    Chemistries   Recent Labs Lab 05/05/15 1320 05/05/15 1602  NA 141 137  K 4.3 3.8  CL 103 105  CO2 27 24  GLUCOSE 165* 217*  BUN 9 8  CREATININE  0.88 0.82  CALCIUM 10.0 9.1  AST 25 32  ALT 42 40  ALKPHOS 97 86  BILITOT 0.6 0.6   ------------------------------------------------------------------------------------------------------------------ estimated creatinine clearance is 107.2 mL/min (by C-G formula based on Cr of 0.82). ------------------------------------------------------------------------------------------------------------------  Recent Labs  05/05/15 1422 05/05/15 1629  HGBA1C 7.5 8.1*   ------------------------------------------------------------------------------------------------------------------  Recent Labs  05/06/15 0056  CHOL 219*  HDL 29*  LDLCALC 139*  TRIG 255*  CHOLHDL  7.6   ------------------------------------------------------------------------------------------------------------------ No results for input(s): TSH, T4TOTAL, T3FREE, THYROIDAB in the last 72 hours.  Invalid input(s): FREET3 ------------------------------------------------------------------------------------------------------------------ No results for input(s): VITAMINB12, FOLATE, FERRITIN, TIBC, IRON, RETICCTPCT in the last 72 hours.  Coagulation profile  Recent Labs Lab 05/05/15 1602  INR 1.08    No results for input(s): DDIMER in the last 72 hours.  Cardiac Enzymes No results for input(s): CKMB, TROPONINI, MYOGLOBIN in the last 168 hours.  Invalid input(s): CK ------------------------------------------------------------------------------------------------------------------ Invalid input(s): POCBNP   Time Spent in minutes   35   Salayah Meares K M.D on 05/06/2015 at 10:05 AM  Between 7am to 7pm - Pager - [phone]  After 7pm go to [email or web address] - password [facility]  [facility] -  Office  [phone]

## 2015-05-06 NOTE — Evaluation (Signed)
Speech Language Pathology Evaluation Patient Details Name: Trevor Wheeler MRN: [id number] DOB: 01/07/1946 Today's Date: 05/06/2015 Time: 3244-0102 SLP Time Calculation (min) (ACUTE ONLY): 13 min  Problem List:  Patient Active Problem List   Diagnosis Date Noted  . Type II diabetes mellitus, uncontrolled 05/06/2015  . Dyslipidemia 05/06/2015  . CVA (cerebral infarction) 05/05/2015  . Nausea & vomiting 05/05/2015  . Balance disorder 05/05/2015  . 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 Acute CVA (cerebrovascular accident)   . HYPERLIPIDEMIA 08/12/2010   Past Medical History:  Past Medical History  Diagnosis Date  . Hypertension   . Type II diabetes mellitus dx'd 05/05/2015  . CVA (cerebral vascular accident) 05/04/2015    L cerebellar, "only problem I have is w/balance" 05/05/2015)   Past Surgical History:  Past Surgical History  Procedure Laterality Date  . No past surgeries     HPI:  69 yo male who was admitted with instability of gait and nausea imaging revealed left inferior and right medial cerebellar strokes.   Assessment / Plan / Recommendation Clinical Impression  Cognitive-linguistic evaluation complete.  Oral motor exam is unremarkable, speech is 100% intelligible and receptive/expressive language abilities are intact.  Patient reports that he is at his baseline with delayed recall and math calculations, with only minor errors noted during evaluation.  Education complete, no skilled SLP services are warranted at this time, patient in agreement.  SLP is signing off.        SLP Assessment  Patient does not need any further Speech Lanaguage Pathology Services    Follow Up Recommendations  None            Pertinent Vitals/Pain Pain Assessment: No/denies pain   SLP Goals  Progression toward goals:  (Eval )  SLP Evaluation Prior Functioning  Cognitive/Linguistic Baseline: Within functional limits Type of Home: House Available Help at Discharge: Family;Available 24  hours/day Vocation: Full time employment   Cognition  Overall Cognitive Status: Within Functional Limits for tasks assessed Orientation Level: Oriented X4    Comprehension  Auditory Comprehension Overall Auditory Comprehension: Appears within functional limits for tasks assessed Visual Recognition/Discrimination Discrimination: Within Function Limits Reading Comprehension Reading Status: Not tested    Expression Expression Primary Mode of Expression: Verbal Verbal Expression Overall Verbal Expression: Appears within functional limits for tasks assessed Written Expression Dominant Hand: Right Written Expression: Within Functional Limits   Oral / Motor Oral Motor/Sensory Function Overall Oral Motor/Sensory Function: Appears within functional limits for tasks assessed Motor Speech Overall Motor Speech: Appears within functional limits for tasks assessed   GO    Carmelia Roller., CCC-SLP [phone]  Beanca Kiester 05/06/2015, 4:43 PM

## 2015-05-07 ENCOUNTER — Encounter (HOSPITAL_COMMUNITY)
Admission: EM | Disposition: A | Discharge status: Home Health | Payer: Self-pay | Source: Home / Self Care | Attending: Internal Medicine

## 2015-05-07 ENCOUNTER — Encounter (HOSPITAL_COMMUNITY): Payer: Self-pay | Admitting: *Deleted

## 2015-05-07 ENCOUNTER — Inpatient Hospital Stay (HOSPITAL_COMMUNITY): Payer: Medicare Other

## 2015-05-07 DIAGNOSIS — I63219 Cerebral infarction due to unspecified occlusion or stenosis of unspecified vertebral arteries: Secondary | ICD-10-CM

## 2015-05-07 DIAGNOSIS — E1165 Type 2 diabetes mellitus with hyperglycemia: Secondary | ICD-10-CM

## 2015-05-07 DIAGNOSIS — I340 Nonrheumatic mitral (valve) insufficiency: Secondary | ICD-10-CM

## 2015-05-07 DIAGNOSIS — I639 Cerebral infarction, unspecified: Secondary | ICD-10-CM

## 2015-05-07 DIAGNOSIS — E785 Hyperlipidemia, unspecified: Secondary | ICD-10-CM

## 2015-05-07 HISTORY — PX: TEE WITHOUT CARDIOVERSION: SHX5443

## 2015-05-07 HISTORY — PX: EP IMPLANTABLE DEVICE: SHX172B

## 2015-05-07 LAB — GLUCOSE, CAPILLARY
GLUCOSE-CAPILLARY: 110 mg/dL — AB (ref 65–99)
GLUCOSE-CAPILLARY: 186 mg/dL — AB (ref 65–99)
Glucose-Capillary: 151 mg/dL — ABNORMAL HIGH (ref 65–99)
Glucose-Capillary: 151 mg/dL — ABNORMAL HIGH (ref 65–99)
Glucose-Capillary: 202 mg/dL — ABNORMAL HIGH (ref 65–99)

## 2015-05-07 SURGERY — ECHOCARDIOGRAM, TRANSESOPHAGEAL
Anesthesia: Moderate Sedation

## 2015-05-07 SURGERY — LOOP RECORDER INSERTION
Anesthesia: LOCAL

## 2015-05-07 MED ORDER — ONDANSETRON HCL 4 MG/2ML IJ SOLN
4.0000 mg | Freq: Four times a day (QID) | INTRAMUSCULAR | Status: DC | PRN
  Administered 2015-05-07 – 2015-05-08 (×2): 4 mg via INTRAVENOUS
  Filled 2015-05-07 (×3): qty 2

## 2015-05-07 MED ORDER — LIDOCAINE VISCOUS 2 % MT SOLN
OROMUCOSAL | Status: DC | PRN
  Administered 2015-05-07: 1 via OROMUCOSAL

## 2015-05-07 MED ORDER — MIDAZOLAM HCL 5 MG/ML IJ SOLN
INTRAMUSCULAR | Status: AC
  Filled 2015-05-07: qty 2

## 2015-05-07 MED ORDER — SODIUM CHLORIDE 0.9 % IV SOLN: INTRAVENOUS | Status: DC

## 2015-05-07 MED ORDER — LIDOCAINE VISCOUS 2 % MT SOLN
OROMUCOSAL | Status: AC
  Filled 2015-05-07: qty 15

## 2015-05-07 MED ORDER — MIDAZOLAM HCL 10 MG/2ML IJ SOLN
INTRAMUSCULAR | Status: DC | PRN
  Administered 2015-05-07: 2 mg via INTRAVENOUS

## 2015-05-07 MED ORDER — LIDOCAINE HCL (PF) 1 % IJ SOLN
INTRAMUSCULAR | Status: AC
  Filled 2015-05-07: qty 30

## 2015-05-07 MED ORDER — FENTANYL CITRATE (PF) 100 MCG/2ML IJ SOLN
INTRAMUSCULAR | Status: DC | PRN
  Administered 2015-05-07: 50 ug via INTRAVENOUS

## 2015-05-07 MED ORDER — LIDOCAINE-EPINEPHRINE 1 %-1:100000 IJ SOLN
INTRAMUSCULAR | Status: DC | PRN
  Administered 2015-05-07: 30 mL via INTRADERMAL

## 2015-05-07 MED ORDER — FENTANYL CITRATE (PF) 100 MCG/2ML IJ SOLN
INTRAMUSCULAR | Status: AC
  Filled 2015-05-07: qty 2

## 2015-05-07 SURGICAL SUPPLY — 2 items
LOOP REVEAL LINQSYS (Prosthesis & Implant Heart) ×1 IMPLANT
PACK LOOP INSERTION (CUSTOM PROCEDURE TRAY) ×2 IMPLANT

## 2015-05-07 NOTE — OR Nursing (Signed)
Loop recorder placed by Dr. Sallyanne Kuster with cath lab staff.  Patient tolerated well without complications

## 2015-05-07 NOTE — Progress Notes (Signed)
Patient provided written information on inexpensive glucose meter, testing strips and supplies from [employer]. CM not able to provide samples.

## 2015-05-07 NOTE — Progress Notes (Signed)
Physical Therapy Treatment Patient Details Name: Trevor Wheeler MRN: [id number] DOB: 11-Jun-1946 Today's Date: 05/07/2015    History of Present Illness 69 yo male who has instability with gait and nausea pre-admit, dx with L inferior and R medial cerebellar strokes.    PT Comments    Patient in bed, agreeable to participate in PT today. Patient was able to ambulate and navigate stairs as described below. He also performed high level balance activities in standing and during ambulation as described in the balance section. Patient will benefit from continued PT to address independence with high level gait activities and balance deficits, more on the R than the L, to return him to his physically demanding job.  Follow Up Recommendations  Supervision - Intermittent;Outpatient PT     Equipment Recommendations  None recommended by PT    Recommendations for Other Services       Precautions / Restrictions Precautions Precautions: Fall Restrictions Weight Bearing Restrictions: No    Mobility  Bed Mobility Overal bed mobility: Independent                Transfers Overall transfer level: Independent   Transfers: Sit to/from Stand Sit to Stand: Independent         General transfer comment: Able to stand without any assistance or guidance.  Ambulation/Gait Ambulation/Gait assistance: Min guard Ambulation Distance (Feet): 300 Feet Assistive device: None Gait Pattern/deviations: Step-through pattern   Gait velocity interpretation: at or above normal speed for age/gender General Gait Details: Patient able to ambulate without difficulties until higher demands placed upon him. See Balance section for details.   Stairs Stairs: Yes Stairs assistance: Supervision Stair Management: One rail Right;Forwards;Alternating pattern Number of Stairs: 24 General stair comments: Supervision for safety, no LOB or difficulties.  Wheelchair Mobility    Modified Rankin (Stroke Patients  Only) Modified Rankin (Stroke Patients Only) Pre-Morbid Rankin Score: No symptoms Modified Rankin: Slight disability     Balance Overall balance assessment: Needs assistance Sitting-balance support: Feet supported;No upper extremity supported Sitting balance-Leahy Scale: Normal     Standing balance support: No upper extremity supported Standing balance-Leahy Scale: Good   Single Leg Stance - Right Leg: 0.5 (Total time, not consecutive) Single Leg Stance - Left Leg: 0.5 Tandem Stance - Right Leg: 1 (Total time, not consecutive) Tandem Stance - Left Leg: 1     High level balance activites: Direction changes;Turns;Sudden stops;Head turns High Level Balance Comments: Patient tolerated high level balance activities well. He needed min A x1 for SLS, R more so than L, as well as min guard for tandem stance. Patient only experienced staggering gait when performing vertical head shakes, but was able to correct himself.    Cognition Arousal/Alertness: Awake/alert Behavior During Therapy: WFL for tasks assessed/performed Overall Cognitive Status: Within Functional Limits for tasks assessed                      Exercises      General Comments        Pertinent Vitals/Pain Pain Assessment: No/denies pain    Home Living                      Prior Function            PT Goals (current goals can now be found in the care plan section) Acute Rehab PT Goals Patient Stated Goal: Go back to work PT Goal Formulation: With patient/family Time For Goal Achievement: 05/20/15 Potential to Achieve Goals:  Good Progress towards PT goals: Progressing toward goals    Frequency  Min 3X/week    PT Plan Frequency needs to be updated    Co-evaluation             End of Session Equipment Utilized During Treatment: Gait belt Activity Tolerance: Patient tolerated treatment well Patient left: in bed;with call bell/phone within reach;with family/visitor present      Time: 4163-8453 PT Time Calculation (min) (ACUTE ONLY): 14 min  Charges:  $Gait Training: 8-22 mins                    G CodesRoanna Epley, SPT [phone] 05/07/2015, 12:37 PM

## 2015-05-07 NOTE — CV Procedure (Signed)
LOOP RECORDER IMPLANT   Procedure report  Procedure performed:  Loop recorder implantation   Reason for procedure:  Cryptogenic stroke Procedure performed by:  Sanda Klein, MD  Complications:  None  Estimated blood loss:  <5 mL  Medications administered during procedure:  Lidocaine 1% with 1/10,000 epinephrine 10 mL locally Device details:  Med[employer]onic Reveal Linq model number [id number], serial number [id number] Procedure details:  After the risks and benefits of the procedure were discussed the patient provided informed consent. The patient was prepped and draped in usual sterile fashion. Local anesthesia was administered to an area 2 cm to the left of the sternum in the 4th intercostal space. A cutaneous incision was made using the incision tool. The introducer was then used to create a subcutaneous tunnel and carefully deploy the device. Local pressure was held to ensure hemostasis.  The incision was closed with SteriStrips and a sterile dressing was applied.  R waves 0.48 V. Sanda Klein, MD, [place] [phone] office [phone] pager 05/07/2015 3:08 PM

## 2015-05-07 NOTE — Consult Note (Signed)
ELECTROPHYSIOLOGY CONSULT NOTE  Patient ID: Trevor Wheeler MRN: [id number], DOB/AGE: 1946-08-26   Admit date: 05/05/2015 Date of Consult: 05/07/2015  Primary Physician: No PCP Per Patient Primary Cardiologist: new to [facility] Reason for Consultation: Cryptogenic stroke; recommendations regarding Implantable Loop Recorder  History of Present Illness Trevor Wheeler was admitted on 05/05/2015 with ataxia.  Imaging demonstrated bilateral cerebellar infarcts felt to be embolic from unknown source.  He has undergone workup for stroke including CTA of the neck. Echo is pending. The patient has been monitored on telemetry which has demonstrated sinus rhythm with no arrhythmias.  Inpatient stroke work-up is to be completed with a TEE.   Lab work is reviewed.  Prior to admission, the patient denies chest pain, shortness of breath, dizziness, palpitations, or syncope.  They are recovering from their stroke with plans to return home at discharge.  EP has been asked to evaluate for placement of an implantable loop recorder to monitor for atrial fibrillation.  ROS is negative except as outlined above.    Past Medical History  Diagnosis Date  . Hypertension   . Type II diabetes mellitus dx'd 05/05/2015  . CVA (cerebral vascular accident) 05/04/2015    L cerebellar, "only problem I have is w/balance" 05/05/2015)     Surgical History:  Past Surgical History  Procedure Laterality Date  . No past surgeries       Prescriptions prior to admission  Medication Sig Dispense Refill Last Dose  . metFORMIN (GLUCOPHAGE) 1000 MG tablet Take 1 tablet (1,000 mg total) by mouth daily after breakfast. 60 tablet 0 Unknown at Unknown time  . naproxen sodium (ANAPROX) 220 MG tablet Take 220 mg by mouth every 12 (twelve) hours as needed (knee pain).   Unknown at Unknown time  . sulfamethoxazole-trimethoprim (BACTRIM DS,SEPTRA DS) 800-160 MG per tablet Take 1 tablet by mouth 2 (two) times daily. 20 tablet 0  Unknown at Unknown time    Inpatient Medications:  . aspirin EC  325 mg Oral Daily  . atorvastatin  20 mg Oral q1800  . glipiZIDE  2.5 mg Oral QAC breakfast  . heparin subcutaneous  5,000 Units Subcutaneous 3 times per day  . insulin aspart  0-9 Units Subcutaneous TID WC    Allergies:  Allergies  Allergen Reactions  . Penicillins Hives    Social History   Social History  . Marital Status: Married    Spouse Name: N/A  . Number of Children: N/A  . Years of Education: N/A   Occupational History  . Not on file.   Social History Main Topics  . Smoking status: Former Smoker -- 1.00 packs/day for 20 years    Types: Cigarettes  . Smokeless tobacco: Never Used     Comment: quit smoking in the 1970's  . Alcohol Use: No  . Drug Use: No  . Sexual Activity: Not Currently   Other Topics Concern  . Not on file   Social History Narrative     Family History  Problem Relation Age of Onset  . Heart disease Mother   . Cancer Brother   . Heart disease Brother      Physical Exam: Filed Vitals:   05/06/15 1823 05/06/15 2119 05/07/15 0524 05/07/15 1025  BP: 154/71 136/75  119/78  Pulse: 88 81  85  Temp: 98.6 F (37 C) 98.8 F (37.1 C) 98.4 F (36.9 C) 99.5 F (37.5 C)  TempSrc: Oral Oral Oral Oral  Resp: 19 20 20 16   Height:  Weight:      SpO2: 99% 97% 99% 100%    GEN- The patient is well appearing, alert and oriented x 3 today.   Head- normocephalic, atraumatic Eyes-  Sclera clear, conjunctiva pink Ears- hearing intact Oropharynx- clear Neck- supple, Lungs- Clear to ausculation bilaterally, normal work of breathing Heart- Regular rate and rhythm  GI- soft, NT, ND, + BS Extremities- no clubbing, cyanosis, or edema MS- no significant deformity or atrophy Skin- no rash or lesion Psych- euthymic mood, full affect   Labs:   Lab Results  Component Value Date   WBC 10.3 05/05/2015   HGB 14.8 05/05/2015   HCT 46.0 05/05/2015   MCV 85.2 05/05/2015   PLT  215 05/05/2015     Recent Labs Lab 05/05/15 1602  NA 137  K 3.8  CL 105  CO2 24  BUN 8  CREATININE 0.82  CALCIUM 9.1  PROT 7.2  BILITOT 0.6  ALKPHOS 86  ALT 40  AST 32  GLUCOSE 217*     Radiology/Studies: Ct Head Wo Contrast 05/05/2015   CLINICAL DATA:  Dizziness for 1 day. New onset diabetes and elevated blood pressure. Initial encounter.  EXAM: CT HEAD WITHOUT CONTRAST  TECHNIQUE: Contiguous axial images were obtained from the base of the skull through the vertex without intravenous contrast.  COMPARISON:  None.  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 No evidence of acute intracranial hemorrhage, mass lesion, extra-axial fluid collection or midline shift. There is no hydrocephalus or additional evidence of acute stroke. Intracranial vascular calcifications are noted.  Minimal left ethmoid sinus mucosal thickening. The visualized paranasal sinuses, mastoid air cells and middle ears are otherwise clear. The calvarium is intact.  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  These results were called by telephone at the time of interpretation on 05/05/2015 at 3:06 pm to [place], [place] for Dr. Rikki Spearing , who verbally acknowledged these results.   Electronically Signed   By: Richardean Sale M.D.   On: 05/05/2015 15:09   Ct Angio Neck W/cm &/or Wo/cm 05/06/2015   CLINICAL DATA:  69 year old male with acute left greater than right cerebellar infarcts. Symptoms beginning on 05/04/2015. Initial encounter.  EXAM: CT ANGIOGRAPHY NECK  TECHNIQUE: Multidetector CT imaging of the neck was performed using the standard protocol during bolus administration of intravenous contrast. Multiplanar CT image reconstructions and MIPs were obtained to evaluate the vascular anatomy. Carotid stenosis measurements (when applicable) are obtained utilizing NASCET  criteria, using the distal internal carotid diameter as the denominator.  CONTRAST:  37mL OMNIPAQUE IOHEXOL 350 MG/ML SOLN  COMPARISON:  Brain MRI and intracranial MRA 05/05/2015.  FINDINGS: Skeleton: Absent dentition. Mild for age cervical spine degeneration. No acute osseous abnormality identified. Visualized paranasal sinuses and mastoids are clear.  Other neck: Negative lung apices. No superior mediastinal lymphadenopathy.  Thyroid, larynx, pharynx, parapharyngeal spaces, retropharyngeal space, sublingual space, submandibular glands and parotid glands are within normal limits. No cervical lymphadenopathy.  Aortic arch: 3 vessel arch configuration. Mild for age soft and calcified arch atherosclerosis. No great vessel origin stenosis.  Right carotid system: Minor soft and calcified plaque at the right carotid bifurcation which is widely patent. Mild calcified plaque continuing in the right ICA bulb without stenosis. Negative cervical right ICA otherwise.  Left carotid system: Mild soft and calcified plaque at the left carotid bifurcation which is widely patent. Mild to moderate calcified plaque continuing in  the left ICA bulb, but with less than 50 % stenosis with respect to the distal vessel (series 5, image 94). Negative cervical left ICA otherwise.  Vertebral arteries:  No proximal right subclavian artery stenosis with mild soft and calcified plaque. Normal right vertebral artery origin. Dominant right vertebral artery is normal to the skullbase. Patent vertebrobasilar junction.  No proximal left subclavian artery stenosis despite soft and calcified plaque. Normal left vertebral artery origin. Non dominant left vertebral artery is patent to the skullbase with mild irregularity, but without stenosis. The distal left vertebral artery functionally terminates in the left PICA a which appears diminutive similar to the recent intracranial MRA. The diminutive left vertebral artery beyond the left PICA remains patent.   IMPRESSION: No hemodynamically significant carotid or vertebral artery stenosis in the neck, as detailed above.  Dominant right vertebral artery. Left greater than right carotid atherosclerosis.   Electronically Signed   By: Genevie Ann M.D.   On: 05/06/2015 17:27   [name] Head Wo Contrast 05/05/2015   CLINICAL DATA:  Dizziness for 1 day. New onset of diabetes and elevated blood pressure.  EXAM: MRI HEAD WITHOUT CONTRAST  MRA HEAD WITHOUT CONTRAST  TECHNIQUE: Multiplanar, multiecho pulse sequences of the brain and surrounding structures were obtained without intravenous contrast. Angiographic images of the head were obtained using MRA technique without contrast.  COMPARISON:  CT head earlier today.  FINDINGS: MRI HEAD FINDINGS  There is a moderate-sized acute LEFT posterior inferior cerebellar artery territory infarct affecting the LEFT inferior cerebellum and LEFT inferior vermis and tonsil. There is a tiny area of acute infarction affecting the medial cerebellum inferiorly on the RIGHT. No brainstem infarction. No cerebral hemispheric infarction.  No MR evidence for acute hemorrhage.  No incipient hydrocephalus or tonsillar herniation. No intracranial mass lesion or extra-axial fluid.  Mild cerebral and cerebellar atrophy. Minor white matter disease, nonspecific.  Flow voids are maintained in the major vessels.  No midline abnormality.  Extracranial soft tissues unremarkable.  Compared with prior CT, good general agreement.  MRA HEAD FINDINGS  The internal carotid arteries are widely patent but dolichoectatic.  Vertebrobasilar system is dolichoectatic but also patent. RIGHT vertebral dominant. LEFT vertebral mildly irregular in its distal segment, not clearly diseased. The origin of the LEFT PICA appears normal, but the distal ramifications of that vessel are not well seen.  No intracranial stenosis or aneurysm. No proximal large vessel occlusion.  IMPRESSION: Moderate-sized acute LEFT PICA territory infarct  affecting the LEFT inferior cerebellum and LEFT inferior vermis and tonsil. Tiny area of acute infarction affecting medial cerebellum on the RIGHT.  The RIGHT vertebral is the dominant contributor to the posterior circulation, but the LEFT is patent without clear intrinsic abnormality. The origin of the LEFT PICA is visualized and is normal, but the distal ramifications of that branch are poorly seen, and likely thrombosed.   Electronically Signed   By: Staci Righter M.D.   On: 05/05/2015 21:34     12-lead ECG sinus rhythm, rate 82, poor R wave progression All prior EKG's in EPIC reviewed with no documented atrial fibrillation  Telemetry sinus rhythm  Assessment and Plan:  1. Cryptogenic stroke The patient presents with cryptogenic stroke.  The patient has a TEE planned for this AM.  I spoke at length with the patient about monitoring for afib with either a 30 day event monitor or an implantable loop recorder.  Risks, benefits, and alteratives to implantable loop recorder were discussed with the patient today.  At this time, the patient is very clear in their decision to proceed with implantable loop recorder.   Wound care was reviewed with the patient (keep incision clean and dry for 3 days).  Wound check scheduled for 05/21/15 at 11:30AM  Please call with questions.   Patsey Berthold, NP 05/07/2015 12:15 PM   I have seen and examined the patient along with Patsey Berthold, NP.  I have reviewed the chart, notes and new data.  I agree with NP's note.  Unexplained stroke in a patient with moderate risk for atrial fibrillation. This procedure has been fully reviewed with the patient and written informed consent has been obtained.  PLAN: Proceed with loop recorder if TEE nondiagnostic.  Sanda Klein, MD, [place] [phone] 05/07/2015, 12:54 PM

## 2015-05-07 NOTE — Progress Notes (Signed)
OT Cancellation Note  Patient Details Name: Trevor Wheeler MRN: [id number] DOB: [birth year]   Cancelled Treatment:    Reason Eval/Treat Not Completed: Patient at procedure or test/ unavailable  Benito Mccreedy OTR/L [phone] 05/07/2015, 3:17 PM

## 2015-05-07 NOTE — Interval H&P Note (Signed)
History and Physical Interval Note:  05/07/2015 3:00 PM  Trevor Wheeler  has presented today for surgery, with the diagnosis of stroke  The various methods of treatment have been discussed with the patient and family. After consideration of risks, benefits and other options for treatment, the patient has consented to  Procedure(s): Loop Recorder Insertion (N/A) as a surgical intervention .  The patient's history has been reviewed, patient examined, no change in status, stable for surgery.  I have reviewed the patient's chart and labs.  Questions were answered to the patient's satisfaction.     Tallis Soledad

## 2015-05-07 NOTE — Progress Notes (Addendum)
STROKE TEAM PROGRESS NOTE   HISTORY Trevor Wheeler is an 69 y.o. male with apparently non relevant past medical history, comes in for further evaluation of acute ataxia. He is accompanied by hius wife. Trevor Wheeler stated that he never head similar symptoms before, but yesterday morning he woke up feeling very off balance and walking was a daunting task. Her expressed that " I had to manage to walk, c\ouldn't stand without falling over, and had to hold to the walls". Said that he vomited x 1 after taking a tablet of Aleve and had to stay in bed the whole day yesterday due to extreme imbalance. Denied associated HA, vertigo, double vision, difficulty swallowing, focal weakness or numbness, confusion, slurred speech, language or vision impairment. He presented to urgent care where he had a CT brain that showed an ill-defined low-density throughout the inferior medial left cerebellum consistent with an early subacute nonhemorrhagic infarct in the PICA distribution. He was last known well 05/03/15 at 9 pm. Patient was not administered TPA secondary to late presentation. He was admitted for further evaluation and treatment.   SUBJECTIVE (INTERVAL HISTORY) No family is at the bedside.  Overall he feels his condition is completely resolved. Had TEE and loop recorder placed today.    OBJECTIVE Temp:  [98.3 F (36.8 C)-99.5 F (37.5 C)] 98.3 F (36.8 C) (08/17 1546) Pulse Rate:  [68-88] 68 (08/17 1546) Cardiac Rhythm:  [-] Normal sinus rhythm;Heart block (08/17 0800) Resp:  [10-22] 18 (08/17 1546) BP: (119-160)/(71-90) 148/88 mmHg (08/17 1546) SpO2:  [94 %-100 %] 100 % (08/17 1546)   Recent Labs Lab 05/06/15 1650 05/06/15 2124 05/07/15 0750 05/07/15 1215 05/07/15 1701  GLUCAP 132* 151* 151* 110* 186*    Recent Labs Lab 05/05/15 1320 05/05/15 1602  NA 141 137  K 4.3 3.8  CL 103 105  CO2 27 24  GLUCOSE 165* 217*  BUN 9 8  CREATININE 0.88 0.82  CALCIUM 10.0 9.1    Recent Labs Lab  05/05/15 1320 05/05/15 1602  AST 25 32  ALT 42 40  ALKPHOS 97 86  BILITOT 0.6 0.6  PROT 7.7 7.2  ALBUMIN 4.6 3.9    Recent Labs Lab 05/05/15 1329 05/05/15 1602  WBC 11.0* 10.3  NEUTROABS  --  5.8  HGB 14.8 14.8  HCT 48.6 46.0  MCV 83.2 85.2  PLT  --  215   No results for input(s): CKTOTAL, CKMB, CKMBINDEX, TROPONINI in the last 168 hours.  Recent Labs  05/05/15 1602  LABPROT 14.2  INR 1.08    Recent Labs  05/05/15 1329  BILIRUBINUR neg  PROTEINUR trace  UROBILINOGEN 0.2  NITRITE positive  LEUKOCYTESUR Negative       Component Value Date/Time   CHOL 219* 05/06/2015 0056   TRIG 255* 05/06/2015 0056   HDL 29* 05/06/2015 0056   CHOLHDL 7.6 05/06/2015 0056   VLDL 51* 05/06/2015 0056   LDLCALC 139* 05/06/2015 0056   Lab Results  Component Value Date   HGBA1C 8.1* 05/05/2015   No results found for: LABOPIA, COCAINSCRNUR, LABBENZ, AMPHETMU, THCU, LABBARB  No results for input(s): ETH in the last 168 hours.   IMAGING I have personally reviewed the radiological images below and agree with the radiology interpretations.  Ct Head Wo Contrast 05/05/2015   Early subacute nonhemorrhagic left PICA distribution infarct.    Mr Virgel Paling Wo Contrast 05/05/2015   The RIGHT vertebral is the dominant contributor to the posterior circulation, but the LEFT is patent without clear  intrinsic abnormality. The origin of the LEFT PICA is visualized and is normal, but the distal ramifications of that branch are poorly seen, and likely thrombosed.     Mr Brain Wo Contrast 05/05/2015    Moderate-sized acute LEFT PICA territory infarct affecting the LEFT inferior cerebellum and LEFT inferior vermis and tonsil. Tiny area of acute infarction affecting medial cerebellum on the RIGHT.    Dg Chest Port 1 View 05/05/2015    No active disease.     Ct Angio Neck W/cm &/or Wo/cm 05/06/2015   IMPRESSION: No hemodynamically significant carotid or vertebral artery stenosis in the neck, as  detailed above.  Dominant right vertebral artery. Left greater than right carotid atherosclerosis.      TEE - LVEF >55%. Mild MR.  No LA appendage or LA thrombus or mass. Negative bubble study. No ASD or PFO.   PHYSICAL EXAM  Temp:  [98.3 F (36.8 C)-99.5 F (37.5 C)] 98.3 F (36.8 C) (08/17 1546) Pulse Rate:  [68-88] 68 (08/17 1546) Resp:  [10-22] 18 (08/17 1546) BP: (119-160)/(71-90) 148/88 mmHg (08/17 1546) SpO2:  [94 %-100 %] 100 % (08/17 1546)  General - Well nourished, well developed, in no apparent distress.  Ophthalmologic - Sharp disc margins OU.   Cardiovascular - Regular rate and rhythm with no murmur.  Mental Status -  Level of arousal and orientation to time, place, and person were intact. Language including expression, naming, repetition, comprehension was assessed and found intact. Fund of Knowledge was assessed and was intact.  Cranial Nerves II - XII - II - Visual field intact OU. III, IV, VI - Extraocular movements intact. V - Facial sensation intact bilaterally. VII - Facial movement intact bilaterally. VIII - Hearing & vestibular intact bilaterally. X - Palate elevates symmetrically. XI - Chin turning & shoulder shrug intact bilaterally. XII - Tongue protrusion intact.  Motor Strength - The patient's strength was normal in all extremities and pronator drift was absent.  Bulk was normal and fasciculations were absent.   Motor Tone - Muscle tone was assessed at the neck and appendages and was normal.  Reflexes - The patient's reflexes were 1+ in all extremities and he had no pathological reflexes.  Sensory - Light touch, temperature/pinprick were assessed and were symmetrical.    Coordination - The patient had normal movements in the hands and feet with no ataxia or dysmetria.  Tremor was absent.  Gait and Station - The patient's transfers, posture, gait, station, and turns were observed as normal.   ASSESSMENT/PLAN Trevor Wheeler is a 69  y.o. male with no significant past medical history presenting with acute ataxia. He did not receive IV t-PA due to delay in arrival.   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   MRA  L PCA likely thrombosed  CTA of neck unremarkable, right VA dominant and L VA diminutive   TEE no cardiac source of emboli  Loop recorder placed.    LDL 139  HgbA1c 8.1  SCDs for VTE prophylaxis Diet Heart Room service appropriate?: Yes; Fluid consistency:: Thin  no antithrombotic prior to admission, now on aspirin 81 mg orally every day  Patient counseled to be compliant with his antithrombotic medications  Ongoing aggressive stroke risk factor management  Therapy recommendations:  Pending  Disposition:  Anticipate return home  Hyperlipidemia  Home  meds:  No statin  LDL 139, goal < 70  New lipitor 20 mg daily added  Continue statin at discharge  Type 2 Diabetes, new diagnosis  No history of diabetes per pt  HgbA1c 8.1, goal < 7.0  Uncontrolled  DB RN has seen and recommended treatment and discharge education, followup  Other Stroke Risk Factors  Advanced age  Former Cigarette smoker  Obesity, Body mass index is 32.88 kg/(m^2).   Hospital day # 2  Neurology will sign off. Please call with questions. Pt will follow up with Dr. Erlinda Hong at [facility] in about one month. Thanks for the consult.  Rosalin Hawking, MD PhD Stroke Neurology 05/07/2015 6:01 PM     To contact Stroke Continuity provider, please refer to [email or web address] After hours, contact General Neurology

## 2015-05-07 NOTE — Progress Notes (Signed)
  Echocardiogram 2D Echocardiogram has been performed.  Darlina Sicilian M 05/07/2015, 3:16 PM

## 2015-05-07 NOTE — Progress Notes (Signed)
Patient Demographics:    Trevor Wheeler, is a 69 y.o. male, DOB - September 24, 1945, TAV:[id number]  Admit date - 05/05/2015   Admitting Physician Roney Jaffe, MD  Outpatient Primary MD for the patient is No PCP Per Patient  LOS - 2   Chief Complaint  Patient presents with  . Dizziness  . Altered Mental Status        Subjective:    Trevor Wheeler today has, No headache, No chest pain, No abdominal pain - No Nausea, No new weakness tingling or numbness, No Cough - SOB.     Assessment  & Plan :   Posterior circulation left cerebellar CVA.  - MRI left PICA infarct affecting L inferior cerebellum, inferior vermis and tonsil as well as tiny R medial cerebellar infarct - CTA unremarkable - Plan is for TEE today, if negative will need further evaluation with a loop recorder, cardiology are following. - LDL 139, continue with statin - Glycohemoglobin A1c is 8.1 - Continue with aspirin 81 mg daily - PT/OT are following - 2-D echo done, results pending  Lab Results  Component Value Date   HGBA1C 8.1* 05/05/2015    Lab Results  Component Value Date   CHOL 219* 05/06/2015   HDL 29* 05/06/2015   LDLCALC 139* 05/06/2015   LDLDIRECT 182.2 08/05/2010   TRIG 255* 05/06/2015   CHOLHDL 7.6 05/06/2015     Undiagnosed dyslipidemia.  - Placed on Lipitor 20. LDL is 139 Goal LDL under 70.    DM type II undiagnosed.  -  placed on Glucotrol along with sliding scale, diabetic education requested. CBGs acceptable  Lab Results  Component Value Date   HGBA1C 8.1* 05/05/2015    CBG (last 3)   Recent Labs  05/06/15 2124 05/07/15 0750 05/07/15 1215  GLUCAP 151* 151* 110*       Code Status : Full  Family Communication  : Wife at bedside  Disposition Plan  : Home pending further  workup  Consults  :  Neuro, cardiology  Procedures  : TTE, CTA head, MRI Brain  DVT Prophylaxis  :   - Heparin   Lab Results  Component Value Date   PLT 215 05/05/2015    Inpatient Medications  Scheduled Meds: . aspirin EC  325 mg Oral Daily  . atorvastatin  20 mg Oral q1800  . glipiZIDE  2.5 mg Oral QAC breakfast  . heparin subcutaneous  5,000 Units Subcutaneous 3 times per day  . insulin aspart  0-9 Units Subcutaneous TID WC   Continuous Infusions: . sodium chloride     PRN Meds:.ondansetron, senna-docusate  Antibiotics  :     Anti-infectives    None        Objective:   Filed Vitals:   05/06/15 2119 05/07/15 0524 05/07/15 1025 05/07/15 1328  BP: 136/75  119/78 154/90  Pulse: 81  85 68  Temp: 98.8 F (37.1 C) 98.4 F (36.9 C) 99.5 F (37.5 C)   TempSrc: Oral Oral Oral   Resp: 20 20 16 11   Height:      Weight:      SpO2: 97% 99% 100% 94%    Wt Readings from Last 3 Encounters:  05/05/15 106.9 kg (235 lb 10.8 oz)  05/05/15 107.[place]  kg (238 lb)  08/12/10 105.235 kg (232 lb)     Intake/Output Summary (Last 24 hours) at 05/07/15 1355 Last data filed at 05/07/15 1233  Gross per 24 hour  Intake    300 ml  Output      0 ml  Net    300 ml     Physical Exam  Awake Alert, Oriented X 3, No new F.N deficits, Normal affect [place].AT,PERRAL Supple Neck,No JVD, No cervical lymphadenopathy appriciated.  Symmetrical Chest wall movement, Good air movement bilaterally, CTAB RRR,No Gallops,Rubs or new Murmurs, No Parasternal Heave +ve B.Sounds, Abd Soft, No tenderness, No organomegaly appriciated, No rebound - guarding or rigidity. No Cyanosis, Clubbing or edema, No new Rash or bruise       Data Review:   Micro Results Recent Results (from the past 240 hour(s))  Urine culture     Status: None (Preliminary result)   Collection Time: 05/05/15  2:33 PM  Result Value Ref Range Status   Preliminary Report Culture reincubated for better growth  Preliminary     Radiology Reports Ct Head Wo Contrast  05/05/2015   CLINICAL DATA:  Dizziness for 1 day. New onset diabetes and elevated blood pressure. Initial encounter.  EXAM: CT HEAD WITHOUT CONTRAST  TECHNIQUE: Contiguous axial images were obtained from the base of the skull through the vertex without intravenous contrast.  COMPARISON:  None.  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 No evidence of acute intracranial hemorrhage, mass lesion, extra-axial fluid collection or midline shift. There is no hydrocephalus or additional evidence of acute stroke. Intracranial vascular calcifications are noted.  Minimal left ethmoid sinus mucosal thickening. The visualized paranasal sinuses, mastoid air cells and middle ears are otherwise clear. The calvarium is intact.  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  These results were called by telephone at the time of interpretation on 05/05/2015 at 3:06 pm to [place], [place] for Dr. Rikki Spearing , who verbally acknowledged these results.   Electronically Signed   By: Richardean Sale M.D.   On: 05/05/2015 15:09   Ct Angio Neck W/cm &/or Wo/cm  05/06/2015   CLINICAL DATA:  69 year old male with acute left greater than right cerebellar infarcts. Symptoms beginning on 05/04/2015. Initial encounter.  EXAM: CT ANGIOGRAPHY NECK  TECHNIQUE: Multidetector CT imaging of the neck was performed using the standard protocol during bolus administration of intravenous contrast. Multiplanar CT image reconstructions and MIPs were obtained to evaluate the vascular anatomy. Carotid stenosis measurements (when applicable) are obtained utilizing NASCET criteria, using the distal internal carotid diameter as the denominator.  CONTRAST:  14mL OMNIPAQUE IOHEXOL 350 MG/ML SOLN  COMPARISON:  Brain MRI and intracranial MRA 05/05/2015.  FINDINGS: Skeleton:  Absent dentition. Mild for age cervical spine degeneration. No acute osseous abnormality identified. Visualized paranasal sinuses and mastoids are clear.  Other neck: Negative lung apices. No superior mediastinal lymphadenopathy.  Thyroid, larynx, pharynx, parapharyngeal spaces, retropharyngeal space, sublingual space, submandibular glands and parotid glands are within normal limits. No cervical lymphadenopathy.  Aortic arch: 3 vessel arch configuration. Mild for age soft and calcified arch atherosclerosis. No great vessel origin stenosis.  Right carotid system: Minor soft and calcified plaque at the right carotid bifurcation which is widely patent. Mild calcified plaque continuing in the right ICA bulb without stenosis. Negative cervical right ICA otherwise.  Left carotid system: Mild soft and calcified plaque at the left carotid bifurcation which  is widely patent. Mild to moderate calcified plaque continuing in the left ICA bulb, but with less than 50 % stenosis with respect to the distal vessel (series 5, image 94). Negative cervical left ICA otherwise.  Vertebral arteries:  No proximal right subclavian artery stenosis with mild soft and calcified plaque. Normal right vertebral artery origin. Dominant right vertebral artery is normal to the skullbase. Patent vertebrobasilar junction.  No proximal left subclavian artery stenosis despite soft and calcified plaque. Normal left vertebral artery origin. Non dominant left vertebral artery is patent to the skullbase with mild irregularity, but without stenosis. The distal left vertebral artery functionally terminates in the left PICA a which appears diminutive similar to the recent intracranial MRA. The diminutive left vertebral artery beyond the left PICA remains patent.  IMPRESSION: No hemodynamically significant carotid or vertebral artery stenosis in the neck, as detailed above.  Dominant right vertebral artery. Left greater than right carotid atherosclerosis.    Electronically Signed   By: Genevie Ann M.D.   On: 05/06/2015 17:27   [name] Head Wo Contrast  05/05/2015   CLINICAL DATA:  Dizziness for 1 day. New onset of diabetes and elevated blood pressure.  EXAM: MRI HEAD WITHOUT CONTRAST  MRA HEAD WITHOUT CONTRAST  TECHNIQUE: Multiplanar, multiecho pulse sequences of the brain and surrounding structures were obtained without intravenous contrast. Angiographic images of the head were obtained using MRA technique without contrast.  COMPARISON:  CT head earlier today.  FINDINGS: MRI HEAD FINDINGS  There is a moderate-sized acute LEFT posterior inferior cerebellar artery territory infarct affecting the LEFT inferior cerebellum and LEFT inferior vermis and tonsil. There is a tiny area of acute infarction affecting the medial cerebellum inferiorly on the RIGHT. No brainstem infarction. No cerebral hemispheric infarction.  No MR evidence for acute hemorrhage.  No incipient hydrocephalus or tonsillar herniation. No intracranial mass lesion or extra-axial fluid.  Mild cerebral and cerebellar atrophy. Minor white matter disease, nonspecific.  Flow voids are maintained in the major vessels.  No midline abnormality.  Extracranial soft tissues unremarkable.  Compared with prior CT, good general agreement.  MRA HEAD FINDINGS  The internal carotid arteries are widely patent but dolichoectatic.  Vertebrobasilar system is dolichoectatic but also patent. RIGHT vertebral dominant. LEFT vertebral mildly irregular in its distal segment, not clearly diseased. The origin of the LEFT PICA appears normal, but the distal ramifications of that vessel are not well seen.  No intracranial stenosis or aneurysm. No proximal large vessel occlusion.  IMPRESSION: Moderate-sized acute LEFT PICA territory infarct affecting the LEFT inferior cerebellum and LEFT inferior vermis and tonsil. Tiny area of acute infarction affecting medial cerebellum on the RIGHT.  The RIGHT vertebral is the dominant contributor to  the posterior circulation, but the LEFT is patent without clear intrinsic abnormality. The origin of the LEFT PICA is visualized and is normal, but the distal ramifications of that branch are poorly seen, and likely thrombosed.   Electronically Signed   By: Staci Righter M.D.   On: 05/05/2015 21:34   Mr Brain Wo Contrast  05/05/2015   CLINICAL DATA:  Dizziness for 1 day. New onset of diabetes and elevated blood pressure.  EXAM: MRI HEAD WITHOUT CONTRAST  MRA HEAD WITHOUT CONTRAST  TECHNIQUE: Multiplanar, multiecho pulse sequences of the brain and surrounding structures were obtained without intravenous contrast. Angiographic images of the head were obtained using MRA technique without contrast.  COMPARISON:  CT head earlier today.  FINDINGS: MRI HEAD FINDINGS  There is a  moderate-sized acute LEFT posterior inferior cerebellar artery territory infarct affecting the LEFT inferior cerebellum and LEFT inferior vermis and tonsil. There is a tiny area of acute infarction affecting the medial cerebellum inferiorly on the RIGHT. No brainstem infarction. No cerebral hemispheric infarction.  No MR evidence for acute hemorrhage.  No incipient hydrocephalus or tonsillar herniation. No intracranial mass lesion or extra-axial fluid.  Mild cerebral and cerebellar atrophy. Minor white matter disease, nonspecific.  Flow voids are maintained in the major vessels.  No midline abnormality.  Extracranial soft tissues unremarkable.  Compared with prior CT, good general agreement.  MRA HEAD FINDINGS  The internal carotid arteries are widely patent but dolichoectatic.  Vertebrobasilar system is dolichoectatic but also patent. RIGHT vertebral dominant. LEFT vertebral mildly irregular in its distal segment, not clearly diseased. The origin of the LEFT PICA appears normal, but the distal ramifications of that vessel are not well seen.  No intracranial stenosis or aneurysm. No proximal large vessel occlusion.  IMPRESSION: Moderate-sized  acute LEFT PICA territory infarct affecting the LEFT inferior cerebellum and LEFT inferior vermis and tonsil. Tiny area of acute infarction affecting medial cerebellum on the RIGHT.  The RIGHT vertebral is the dominant contributor to the posterior circulation, but the LEFT is patent without clear intrinsic abnormality. The origin of the LEFT PICA is visualized and is normal, but the distal ramifications of that branch are poorly seen, and likely thrombosed.   Electronically Signed   By: Staci Righter M.D.   On: 05/05/2015 21:34   Dg Chest Port 1 View  05/05/2015   CLINICAL DATA:  Acute CVA  EXAM: PORTABLE CHEST - 1 VIEW  COMPARISON:  None.  FINDINGS: Normal heart size and mediastinal contours. No acute infiltrate or edema. No effusion or pneumothorax. No acute osseous findings.  IMPRESSION: No active disease.   Electronically Signed   By: Monte Fantasia M.D.   On: 05/05/2015 21:51     CBC  Recent Labs Lab 05/05/15 1329 05/05/15 1602  WBC 11.0* 10.3  HGB 14.8 14.8  HCT 48.6 46.0  PLT  --  215  MCV 83.2 85.2  MCH 25.3* 27.4  MCHC 30.4* 32.2  RDW  --  13.4  LYMPHSABS  --  3.7  MONOABS  --  0.7  EOSABS  --  0.1  BASOSABS  --  0.0    Chemistries   Recent Labs Lab 05/05/15 1320 05/05/15 1602  NA 141 137  K 4.3 3.8  CL 103 105  CO2 27 24  GLUCOSE 165* 217*  BUN 9 8  CREATININE 0.88 0.82  CALCIUM 10.0 9.1  AST 25 32  ALT 42 40  ALKPHOS 97 86  BILITOT 0.6 0.6   ------------------------------------------------------------------------------------------------------------------ estimated creatinine clearance is 107.2 mL/min (by C-G formula based on Cr of 0.82). ------------------------------------------------------------------------------------------------------------------  Recent Labs  05/05/15 1422 05/05/15 1629  HGBA1C 7.5 8.1*   ------------------------------------------------------------------------------------------------------------------  Recent Labs   05/06/15 0056  CHOL 219*  HDL 29*  LDLCALC 139*  TRIG 255*  CHOLHDL 7.6   ------------------------------------------------------------------------------------------------------------------ No results for input(s): TSH, T4TOTAL, T3FREE, THYROIDAB in the last 72 hours.  Invalid input(s): FREET3 ------------------------------------------------------------------------------------------------------------------ No results for input(s): VITAMINB12, FOLATE, FERRITIN, TIBC, IRON, RETICCTPCT in the last 72 hours.  Coagulation profile  Recent Labs Lab 05/05/15 1602  INR 1.08    No results for input(s): DDIMER in the last 72 hours.  Cardiac Enzymes No results for input(s): CKMB, TROPONINI, MYOGLOBIN in the last 168 hours.  Invalid input(s): CK ------------------------------------------------------------------------------------------------------------------ Invalid input(s):  POCBNP   Time Spent in minutes   30 minutes   Rennie Rouch M.D on 05/07/2015 at 1:55 PM  Between 7am to 7pm - Pager - [phone]  After 7pm go to [email or web address] - password [facility]  [facility] -  Office  [phone]

## 2015-05-07 NOTE — CV Procedure (Signed)
LVEF >55%. Mild MR.   No LA appendage or LA thrombus or mass. Negative bubble study.  No ASD or PFO.  See full report for further details.   Deshon Koslowski C. Oval Linsey, MD 2:47 PM

## 2015-05-07 NOTE — Progress Notes (Signed)
Pt c/o of nausea with dizzness and sweating. VS stable, Orthostatics negative. Requesting meds for nausea. Paged oncall NP awaiting callback or orders to be placed. Will continue to monitor.

## 2015-05-08 ENCOUNTER — Encounter (HOSPITAL_COMMUNITY): Payer: Self-pay | Admitting: Cardiovascular Disease

## 2015-05-08 DIAGNOSIS — I63119 Cerebral infarction due to embolism of unspecified vertebral artery: Secondary | ICD-10-CM

## 2015-05-08 LAB — CBC
HCT: 44.7 % (ref 39.0–52.0)
HEMOGLOBIN: 14.7 g/dL (ref 13.0–17.0)
MCH: 27.6 pg (ref 26.0–34.0)
MCHC: 32.9 g/dL (ref 30.0–36.0)
MCV: 84 fL (ref 78.0–100.0)
PLATELETS: 210 10*3/uL (ref 150–400)
RBC: 5.32 MIL/uL (ref 4.22–5.81)
RDW: 13.2 % (ref 11.5–15.5)
WBC: 9.8 10*3/uL (ref 4.0–10.5)

## 2015-05-08 LAB — URINALYSIS, ROUTINE W REFLEX MICROSCOPIC
Bilirubin Urine: NEGATIVE
Glucose, UA: 500 mg/dL — AB
Hgb urine dipstick: NEGATIVE
KETONES UR: NEGATIVE mg/dL
LEUKOCYTES UA: NEGATIVE
NITRITE: NEGATIVE
PROTEIN: NEGATIVE mg/dL
Specific Gravity, Urine: 1.018 (ref 1.005–1.030)
UROBILINOGEN UA: 0.2 mg/dL (ref 0.0–1.0)
pH: 5.5 (ref 5.0–8.0)

## 2015-05-08 LAB — BASIC METABOLIC PANEL
Anion gap: 6 (ref 5–15)
BUN: 12 mg/dL (ref 6–20)
CHLORIDE: 105 mmol/L (ref 101–111)
CO2: 28 mmol/L (ref 22–32)
CREATININE: 0.88 mg/dL (ref 0.61–1.24)
Calcium: 9.3 mg/dL (ref 8.9–10.3)
Glucose, Bld: 159 mg/dL — ABNORMAL HIGH (ref 65–99)
Potassium: 4.6 mmol/L (ref 3.5–5.1)
SODIUM: 139 mmol/L (ref 135–145)

## 2015-05-08 LAB — GLUCOSE, CAPILLARY
GLUCOSE-CAPILLARY: 148 mg/dL — AB (ref 65–99)
Glucose-Capillary: 162 mg/dL — ABNORMAL HIGH (ref 65–99)

## 2015-05-08 LAB — URINE CULTURE: Colony Count: 15000

## 2015-05-08 MED ORDER — ASPIRIN 325 MG PO TBEC: 325.0000 mg | DELAYED_RELEASE_TABLET | Freq: Every day | ORAL | Status: DC

## 2015-05-08 MED ORDER — GLIPIZIDE 5 MG PO TABS: 2.5000 mg | ORAL_TABLET | Freq: Every day | ORAL | Status: DC

## 2015-05-08 MED ORDER — ATORVASTATIN CALCIUM 20 MG PO TABS: 20.0000 mg | ORAL_TABLET | Freq: Every day | ORAL | Status: DC

## 2015-05-08 NOTE — Care Management Note (Signed)
Case Management Note  Patient Details  Name: Trevor Wheeler MRN: [id number] Date of Birth: 11/17/1945  Subjective/Objective:                  Spoke with patient and wife at bedside. Patient is currently employed at [employer]. Patient does not have a PCP but does have private insurance, received [facility] number to find MD who accepts his insurance. Patient is newly diagnosed as diabetic, is to receive diabetic teaching today. CM to follow medications on discharge to provide assistance if possible. Patient does not currently use any walking assistive devices, will await PT recommendation. Anticipate DC to home with wife. Referral made [facility], patient given map with contact phone number and instructions to call if he does not hear from them by Monday.   Action/Plan:  DC to home with wife.  Expected Discharge Date:                  Expected Discharge Plan:  [place]  In-House Referral:  Clinical Social Work  Discharge planning Services  CM Consult  Post Acute Care Choice:    Choice offered to:  Patient  DME Arranged:    DME Agency:     HH Arranged:    [place] Agency:     Status of Service:  Completed, signed off  Medicare Important Message Given:    Date Medicare IM Given:    Medicare IM give by:    Date Additional Medicare IM Given:    Additional Medicare Important Message give by:     If discussed at [place] of Stay Meetings, dates discussed:    Additional Comments:  Carles Collet, RN 05/08/2015, 11:28 AM

## 2015-05-08 NOTE — Discharge Summary (Addendum)
Trevor Wheeler, is a 69 y.o. male  DOB 01/30/1946  MRN [id number].  Admission date:  05/05/2015  Admitting Physician  Roney Jaffe, MD  Discharge Date:  05/08/2015   Primary MD  No PCP Per Patient  Recommendations for primary care physician for things to follow:  - please check CBC, BMP during next visit. - Monitor diabetes mellitus, and adjust medication as needed.   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 Hypertension   . Type II diabetes mellitus dx'd 05/05/2015  . CVA (cerebral vascular accident) 05/04/2015    L cerebellar, "only problem I have is w/balance" 05/05/2015)    Past Surgical History  Procedure Laterality Date  . No past surgeries    . Ep implantable device N/A 05/07/2015    Procedure: Loop Recorder Insertion;  Surgeon: Sanda Klein, MD;  Location: [facility];  Service: Cardiovascular;  Laterality: N/A;  . Tee without cardioversion N/A 05/07/2015    Procedure: TRANSESOPHAGEAL ECHOCARDIOGRAM (TEE);  Surgeon: Skeet Latch, MD;  Location: [facility];  Service: Cardiovascular;  Laterality: N/A;       History of present illness and  Hospital Course:     Kindly see H&P for history of present illness and admission details, please review complete Labs, Consult reports and Test reports for  all details in brief  HPI  from the history and physical done on the day of admission 05/05/2015 Trevor Wheeler is a 69 y.o. male with no chronic medical conditions who presents with unsteady gait/ loss of balance, nausea and vomiting which started abruptly when he woke up yesterday morning. His wife convinced him to come to the ED today where CT showed L cerebellar CVA. MRI confirms.   Patient smoked < 10 years, quit 1970''s. Works in Theatre manager for [employer], no etoh, no hx HTN/ DM and doesn't take Rx medication. His PCP recently told him about a "borderline" high BP possibility.     Hospital Course   Posterior circulation left cerebellar CVA.  - MRI left PICA infarct affecting L inferior cerebellum, inferior vermis and tonsil as well as tiny R medial cerebellar infarct - CTA unremarkable - TEE on 8/17, with no source of emboli, so  loop recorder was inserted to rule out A. fib, cardiology are following. - LDL 139, continue with statin - Glycohemoglobin A1c is 8.1 - Continue with aspirin 325 mg daily on discharge    Recent Labs    Lab Results  Component Value Date  HGBA1C 8.1* 05/05/2015       Recent Labs    Lab Results  Component Value Date   CHOL 219* 05/06/2015   HDL 29* 05/06/2015   LDLCALC 139* 05/06/2015   LDLDIRECT 182.2 08/05/2010   TRIG 255* 05/06/2015   CHOLHDL 7.6 05/06/2015       Undiagnosed dyslipidemia.  - Placed on Lipitor 20. LDL is 139 Goal LDL under 70.   DM type II undiagnosed.  - placed on Glucotrol along with sliding scale during hospital stay, CBGs has been acceptable, so we'll discharge on Glucotrol        Discharge Condition:  stable   Follow UP  Follow-up Information    Follow up with [facility] On 05/21/2015.   Why:  at 11:30AM for wound check   Contact information:   [place] [address]  [phone]       Follow up with  [facility].   Specialty:  Rehabilitation   Why:  Outpatient physical therapy. If office does not call within 3 days of discharge, please call and make an appointment. Referral has been placed electronically.   Contact information:   [address] [place] [id number] [place] [address] [phone]      Follow up with Xu,Jindong, MD. Schedule an appointment as soon as possible for a visit on 06/11/2015.   Specialty:  Neurology   Why:  stroke clinic/// Appointment is on Jun 11, 2015 at 07:45am   Contact information:   [place] [phone]         Discharge Instructions  and  Discharge Medications         Discharge Instructions    Ambulatory referral to Neurology    Complete by:  As directed   Pt will follow up with Dr. Erlinda Hong at [facility] in about one month. Thanks.     Diet - low sodium heart healthy    Complete by:  As directed      Discharge instructions    Complete by:  As directed   Follow with Primary MD  in 7 days   Get CBC, CMP, 2 view Chest X ray checked  by Primary MD next visit.    Activity: As tolerated with Full fall precautions use walker/cane & assistance as needed   Disposition Home **   Diet: Heart Healthy , carbohydrate modified, with feeding assistance and aspiration precautions.  For Heart failure patients - Check your Weight same time everyday, if you gain over 2 pounds, or you develop in leg swelling, experience more shortness of breath or chest pain, call your Primary MD immediately. Follow Cardiac Low Salt Diet and 1.5 lit/day fluid restriction.   On your next visit with your primary care physician please Get Medicines reviewed and adjusted.   Please request your Prim.MD to go over all Hospital Tests and Procedure/Radiological results at the follow up, please get all Hospital records sent to your Prim MD by signing hospital release before you go  home.   If you experience worsening of your admission symptoms, develop shortness of breath, life threatening emergency, suicidal or homicidal thoughts you must seek medical attention immediately by calling 911 or calling your MD immediately  if symptoms less severe.  You Must read complete instructions/literature along with all the possible adverse reactions/side effects for all the Medicines you take and that have been prescribed to you. Take any new Medicines after you have completely  understood and accpet all the possible adverse reactions/side effects.   Do not drive, operating heavy machinery, perform activities at heights, swimming or participation in water activities or provide baby sitting services if your were admitted for syncope or siezures until you have seen by Primary MD or a Neurologist and advised to do so again.  Do not drive when taking Pain medications.    Do not take more than prescribed Pain, Sleep and Anxiety Medications  Special Instructions: If you have smoked or chewed Tobacco  in the last 2 yrs please stop smoking, stop any regular Alcohol  and or any Recreational drug use.  Wear Seat belts while driving.   Please note  You were cared for by a hospitalist during your hospital stay. If you have any questions about your discharge medications or the care you received while you were in the hospital after you are discharged, you can call the unit and asked to speak with the hospitalist on call if the hospitalist that took care of you is not available. Once you are discharged, your primary care physician will handle any further medical issues. Please note that NO REFILLS for any discharge medications will be authorized once you are discharged, as it is imperative that you return to your primary care physician (or establish a relationship with a primary care physician if you do not have one) for your aftercare needs so that they can reassess your need for medications and  monitor your lab values.     Increase activity slowly    Complete by:  As directed             Medication List    STOP taking these medications        metFORMIN 1000 MG tablet  Commonly known as:  GLUCOPHAGE     naproxen sodium 220 MG tablet  Commonly known as:  ANAPROX     sulfamethoxazole-trimethoprim 800-160 MG per tablet  Commonly known as:  BACTRIM DS,SEPTRA DS      TAKE these medications        aspirin 325 MG EC tablet  Take 1 tablet (325 mg total) by mouth daily.     atorvastatin 20 MG tablet  Commonly known as:  LIPITOR  Take 1 tablet (20 mg total) by mouth daily at 6 PM.     glipiZIDE 5 MG tablet  Commonly known as:  GLUCOTROL  Take 0.5 tablets (2.5 mg total) by mouth daily before breakfast.          Diet and Activity recommendation: See Discharge Instructions above   Consults obtained -  Neurology cardiology   Major procedures and Radiology Reports - PLEASE review detailed and final reports for all details, in brief -   TEE 8/17 Loop recoder insertion 8/17  Ct Head Wo Contrast  05/05/2015   CLINICAL DATA:  Dizziness for 1 day. New onset diabetes and elevated blood pressure. Initial encounter.  EXAM: CT HEAD WITHOUT CONTRAST  TECHNIQUE: Contiguous axial images were obtained from the base of the skull through the vertex without intravenous contrast.  COMPARISON:  None.  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 No evidence of acute intracranial hemorrhage, mass lesion, extra-axial fluid collection or midline shift. There is no hydrocephalus or additional evidence of acute stroke. Intracranial vascular calcifications are noted.  Minimal left ethmoid sinus mucosal thickening. The visualized paranasal sinuses, mastoid air cells and middle ears are  otherwise clear. The calvarium is intact.  IMPRESSION: Early subacute  nonhemorrhagic left PICA distribution infarct.  These results were called by telephone at the time of interpretation on 05/05/2015 at 3:06 pm to [place], [place] for Dr. Rikki Spearing , who verbally acknowledged these results.   Electronically Signed   By: Richardean Sale M.D.   On: 05/05/2015 15:09   Ct Angio Neck W/cm &/or Wo/cm  05/06/2015   CLINICAL DATA:  69 year old male with acute left greater than right cerebellar infarcts. Symptoms beginning on 05/04/2015. Initial encounter.  EXAM: CT ANGIOGRAPHY NECK  TECHNIQUE: Multidetector CT imaging of the neck was performed using the standard protocol during bolus administration of intravenous contrast. Multiplanar CT image reconstructions and MIPs were obtained to evaluate the vascular anatomy. Carotid stenosis measurements (when applicable) are obtained utilizing NASCET criteria, using the distal internal carotid diameter as the denominator.  CONTRAST:  64mL OMNIPAQUE IOHEXOL 350 MG/ML SOLN  COMPARISON:  Brain MRI and intracranial MRA 05/05/2015.  FINDINGS: Skeleton: Absent dentition. Mild for age cervical spine degeneration. No acute osseous abnormality identified. Visualized paranasal sinuses and mastoids are clear.  Other neck: Negative lung apices. No superior mediastinal lymphadenopathy.  Thyroid, larynx, pharynx, parapharyngeal spaces, retropharyngeal space, sublingual space, submandibular glands and parotid glands are within normal limits. No cervical lymphadenopathy.  Aortic arch: 3 vessel arch configuration. Mild for age soft and calcified arch atherosclerosis. No great vessel origin stenosis.  Right carotid system: Minor soft and calcified plaque at the right carotid bifurcation which is widely patent. Mild calcified plaque continuing in the right ICA bulb without stenosis. Negative cervical right ICA otherwise.  Left carotid system: Mild soft and calcified plaque at the left carotid bifurcation which is widely patent. Mild to moderate calcified plaque continuing in  the left ICA bulb, but with less than 50 % stenosis with respect to the distal vessel (series 5, image 94). Negative cervical left ICA otherwise.  Vertebral arteries:  No proximal right subclavian artery stenosis with mild soft and calcified plaque. Normal right vertebral artery origin. Dominant right vertebral artery is normal to the skullbase. Patent vertebrobasilar junction.  No proximal left subclavian artery stenosis despite soft and calcified plaque. Normal left vertebral artery origin. Non dominant left vertebral artery is patent to the skullbase with mild irregularity, but without stenosis. The distal left vertebral artery functionally terminates in the left PICA a which appears diminutive similar to the recent intracranial MRA. The diminutive left vertebral artery beyond the left PICA remains patent.  IMPRESSION: No hemodynamically significant carotid or vertebral artery stenosis in the neck, as detailed above.  Dominant right vertebral artery. Left greater than right carotid atherosclerosis.   Electronically Signed   By: Genevie Ann M.D.   On: 05/06/2015 17:27   [name] Head Wo Contrast  05/05/2015   CLINICAL DATA:  Dizziness for 1 day. New onset of diabetes and elevated blood pressure.  EXAM: MRI HEAD WITHOUT CONTRAST  MRA HEAD WITHOUT CONTRAST  TECHNIQUE: Multiplanar, multiecho pulse sequences of the brain and surrounding structures were obtained without intravenous contrast. Angiographic images of the head were obtained using MRA technique without contrast.  COMPARISON:  CT head earlier today.  FINDINGS: MRI HEAD FINDINGS  There is a moderate-sized acute LEFT posterior inferior cerebellar artery territory infarct affecting the LEFT inferior cerebellum and LEFT inferior vermis and tonsil. There is a tiny area of acute infarction affecting the medial cerebellum inferiorly on the RIGHT. No brainstem infarction. No cerebral hemispheric infarction.  No MR evidence for  acute hemorrhage.  No incipient  hydrocephalus or tonsillar herniation. No intracranial mass lesion or extra-axial fluid.  Mild cerebral and cerebellar atrophy. Minor white matter disease, nonspecific.  Flow voids are maintained in the major vessels.  No midline abnormality.  Extracranial soft tissues unremarkable.  Compared with prior CT, good general agreement.  MRA HEAD FINDINGS  The internal carotid arteries are widely patent but dolichoectatic.  Vertebrobasilar system is dolichoectatic but also patent. RIGHT vertebral dominant. LEFT vertebral mildly irregular in its distal segment, not clearly diseased. The origin of the LEFT PICA appears normal, but the distal ramifications of that vessel are not well seen.  No intracranial stenosis or aneurysm. No proximal large vessel occlusion.  IMPRESSION: Moderate-sized acute LEFT PICA territory infarct affecting the LEFT inferior cerebellum and LEFT inferior vermis and tonsil. Tiny area of acute infarction affecting medial cerebellum on the RIGHT.  The RIGHT vertebral is the dominant contributor to the posterior circulation, but the LEFT is patent without clear intrinsic abnormality. The origin of the LEFT PICA is visualized and is normal, but the distal ramifications of that branch are poorly seen, and likely thrombosed.   Electronically Signed   By: Staci Righter M.D.   On: 05/05/2015 21:34   Mr Brain Wo Contrast  05/05/2015   CLINICAL DATA:  Dizziness for 1 day. New onset of diabetes and elevated blood pressure.  EXAM: MRI HEAD WITHOUT CONTRAST  MRA HEAD WITHOUT CONTRAST  TECHNIQUE: Multiplanar, multiecho pulse sequences of the brain and surrounding structures were obtained without intravenous contrast. Angiographic images of the head were obtained using MRA technique without contrast.  COMPARISON:  CT head earlier today.  FINDINGS: MRI HEAD FINDINGS  There is a moderate-sized acute LEFT posterior inferior cerebellar artery territory infarct affecting the LEFT inferior cerebellum and LEFT inferior  vermis and tonsil. There is a tiny area of acute infarction affecting the medial cerebellum inferiorly on the RIGHT. No brainstem infarction. No cerebral hemispheric infarction.  No MR evidence for acute hemorrhage.  No incipient hydrocephalus or tonsillar herniation. No intracranial mass lesion or extra-axial fluid.  Mild cerebral and cerebellar atrophy. Minor white matter disease, nonspecific.  Flow voids are maintained in the major vessels.  No midline abnormality.  Extracranial soft tissues unremarkable.  Compared with prior CT, good general agreement.  MRA HEAD FINDINGS  The internal carotid arteries are widely patent but dolichoectatic.  Vertebrobasilar system is dolichoectatic but also patent. RIGHT vertebral dominant. LEFT vertebral mildly irregular in its distal segment, not clearly diseased. The origin of the LEFT PICA appears normal, but the distal ramifications of that vessel are not well seen.  No intracranial stenosis or aneurysm. No proximal large vessel occlusion.  IMPRESSION: Moderate-sized acute LEFT PICA territory infarct affecting the LEFT inferior cerebellum and LEFT inferior vermis and tonsil. Tiny area of acute infarction affecting medial cerebellum on the RIGHT.  The RIGHT vertebral is the dominant contributor to the posterior circulation, but the LEFT is patent without clear intrinsic abnormality. The origin of the LEFT PICA is visualized and is normal, but the distal ramifications of that branch are poorly seen, and likely thrombosed.   Electronically Signed   By: Staci Righter M.D.   On: 05/05/2015 21:34   Dg Chest Port 1 View  05/05/2015   CLINICAL DATA:  Acute CVA  EXAM: PORTABLE CHEST - 1 VIEW  COMPARISON:  None.  FINDINGS: Normal heart size and mediastinal contours. No acute infiltrate or edema. No effusion or pneumothorax. No acute osseous findings.  IMPRESSION: No active disease.   Electronically Signed   By: Monte Fantasia M.D.   On: 05/05/2015 21:51    Micro Results      Recent Results (from the past 240 hour(s))  Urine culture     Status: None   Collection Time: 05/05/15  2:33 PM  Result Value Ref Range Status   Culture ENTEROCOCCUS SPECIES  Final   [place] Count 15,000 COLONIES/ML  Final   Organism ID, Bacteria ENTEROCOCCUS SPECIES  Final      Susceptibility   Enterococcus species -  (no method available)    AMPICILLIN <=2 Sensitive     LEVOFLOXACIN 1 Sensitive     NITROFURANTOIN <=16 Sensitive     VANCOMYCIN 1 Sensitive     TETRACYCLINE >=16 Resistant        Today   Subjective:   Trevor Wheeler today has no headache,no chest abdominal pain,no new weakness tingling or numbness,   Objective:   Blood pressure 126/79, pulse 64, temperature 98 F (36.7 C), temperature source Oral, resp. rate 16, height 5\' 11"  (1.803 m), weight 106.9 kg (235 lb 10.8 oz), SpO2 99 %.   Intake/Output Summary (Last 24 hours) at 05/08/15 1522 Last data filed at 05/08/15 1239  Gross per 24 hour  Intake    880 ml  Output    740 ml  Net    140 ml    Exam Awake Alert, Oriented x 3, No new F.N deficits, Normal affect [place].AT,PERRAL Supple Neck,No JVD, No cervical lymphadenopathy appriciated.  Symmetrical Chest wall movement, Good air movement bilaterally, CTAB RRR,No Gallops,Rubs or new Murmurs, No Parasternal Heave +ve B.Sounds, Abd Soft, Non tender, No organomegaly appriciated, No rebound -guarding or rigidity. No Cyanosis, Clubbing or edema, No new Rash or bruise  Data Review   CBC w Diff:  Lab Results  Component Value Date   WBC 9.8 05/08/2015   WBC 11.0* 05/05/2015   HGB 14.7 05/08/2015   HGB 14.8 05/05/2015   HCT 44.7 05/08/2015   HCT 48.6 05/05/2015   PLT 210 05/08/2015   LYMPHOPCT 36 05/05/2015   MONOPCT 7 05/05/2015   EOSPCT 1 05/05/2015   BASOPCT 0 05/05/2015    CMP:  Lab Results  Component Value Date   NA 139 05/08/2015   K 4.6 05/08/2015   CL 105 05/08/2015   CO2 28 05/08/2015   BUN 12 05/08/2015   CREATININE 0.88 05/08/2015    CREATININE 0.88 05/05/2015   PROT 7.2 05/05/2015   ALBUMIN 3.9 05/05/2015   BILITOT 0.6 05/05/2015   ALKPHOS 86 05/05/2015   AST 32 05/05/2015   ALT 40 05/05/2015  .   Total Time in preparing paper work, data evaluation and todays exam - 35 minutes  ELGERGAWY, DAWOOD M.D on 05/08/2015 at 3:22 PM  [facility]   Office  [phone]

## 2015-05-08 NOTE — Progress Notes (Signed)
Occupational Therapy Evaluation Patient Details Name: Trevor Wheeler MRN: [id number] DOB: [birth year] Today's Date: 05/08/2015    History of Present Illness 69 yo male who has instability with gait and nausea pre-admit, dx with L inferior and R medial cerebellar strokes.   Clinical Impression   Pt making good progress. Demonstrates mild instability with higher balance activities, especially when distracted. Pt safe to D/C home with intermittent S, however recommend pt follow up with neuro outpt OT to facilitate safe return to work. Educated pt on warning signs/symptoms of CVA. Pt verbalized understanding. All further OT to be addressed by outpt OT.     Follow Up Recommendations  Outpatient OT;Supervision - Intermittent (neuro outpt)    Equipment Recommendations  None recommended by OT;Other (comment) (pt able to borrow shower seat)    Recommendations for Other Services       Precautions / Restrictions Precautions Precautions: Fall      Mobility Bed Mobility Overal bed mobility: Independent                Transfers Overall transfer level: Modified independent                    Balance Overall balance assessment: Needs assistance   Sitting balance-Leahy Scale: Good       Standing balance-Leahy Scale: Good                   Standardized Balance Assessment Standardized Balance Assessment : Dynamic Gait Index   Dynamic Gait Index Level Surface: Normal Change in Gait Speed: Normal Gait with Horizontal Head Turns: Mild Impairment Gait with Vertical Head Turns: Mild Impairment Gait and Pivot Turn: Mild Impairment Step Over Obstacle: Normal Step Around Obstacles: Mild Impairment Steps: Normal Total Score: 20      ADL Overall ADL's : At baseline                                       General ADL Comments: Recommend S with shower transfer adn use of shower seat initially. Recommended pt have grab bar in shower     Vision     Perception     Praxis      Pertinent Vitals/Pain Pain Assessment: No/denies pain     Hand Dominance Right   Extremity/Trunk Assessment Upper Extremity Assessment Upper Extremity Assessment: Overall WFL for tasks assessed   Lower Extremity Assessment Lower Extremity Assessment: Defer to PT evaluation (minimal difficulty with coordinated movements)   Cervical / Trunk Assessment Cervical / Trunk Assessment: Normal   Communication     Cognition Arousal/Alertness: Awake/alert Behavior During Therapy: WFL for tasks assessed/performed Overall Cognitive Status: Within Functional Limits for tasks assessed                     General Comments       Exercises       Shoulder Instructions      Home Living Family/patient expects to be discharged to:: Private residence Living Arrangements: Spouse/significant other;Children Available Help at Discharge: Family;Available 24 hours/day Type of Home: House Home Access: Stairs to enter [employer] of Steps: 10 Entrance Stairs-Rails: Right;Left;Can reach both Home Layout: Two level Alternate Level Stairs-Number of Steps: 13 Alternate Level Stairs-Rails: Left Bathroom Shower/Tub: Occupational psychologist: Standard Bathroom Accessibility: No   Home Equipment: None   Additional Comments: was working and independent, drives; states he can  get DME from work if needed      Prior Functioning/Environment Level of Independence: Independent             OT Diagnosis: Ataxia   OT Problem List: Decreased coordination;Decreased knowledge of precautions   OT Treatment/Interventions:      OT Goals(Current goals can be found in the care plan section) Acute Rehab OT Goals Patient Stated Goal: Go back to work OT Goal Formulation: All assessment and education complete, DC therapy  OT Frequency:     Barriers to D/C:            Co-evaluation              End of Session Equipment Utilized During  Treatment: Gait belt Nurse Communication: Mobility status  Activity Tolerance: Patient tolerated treatment well Patient left: in chair;with call bell/phone within reach;with family/visitor present   Time: 1009-1033 OT Time Calculation (min): 24 min Charges:  OT General Charges $OT Visit: 1 Procedure OT Evaluation $Initial OT Evaluation Tier I: 1 Procedure OT Treatments $Self Care/Home Management : 8-22 mins G-Codes:    Nereyda Bowler,HILLARY 2015/05/24, 10:42 AM   Maurie Boettcher, OTR/L  [phone] 2015-05-24

## 2015-05-08 NOTE — Progress Notes (Signed)
Reviewed discharge paperwork about diabetes management and stroke education.  Pt verbalized understanding.  PIV removed.  Prescriptions given.  Pt taken to discharge location via wheelchair.

## 2015-05-08 NOTE — Discharge Instructions (Signed)
Follow with Primary MD  in 7 days   Get CBC, CMP, 2 view Chest X ray checked  by Primary MD next visit.    Activity: As tolerated with Full fall precautions use walker/cane & assistance as needed   Disposition Home    Diet: Heart Healthy , carbohydrate modified , with feeding assistance and aspiration precautions.  For Heart failure patients - Check your Weight same time everyday, if you gain over 2 pounds, or you develop in leg swelling, experience more shortness of breath or chest pain, call your Primary MD immediately. Follow Cardiac Low Salt Diet and 1.5 lit/day fluid restriction.   On your next visit with your primary care physician please Get Medicines reviewed and adjusted.   Please request your Prim.MD to go over all Hospital Tests and Procedure/Radiological results at the follow up, please get all Hospital records sent to your Prim MD by signing hospital release before you go home.   If you experience worsening of your admission symptoms, develop shortness of breath, life threatening emergency, suicidal or homicidal thoughts you must seek medical attention immediately by calling 911 or calling your MD immediately  if symptoms less severe.  You Must read complete instructions/literature along with all the possible adverse reactions/side effects for all the Medicines you take and that have been prescribed to you. Take any new Medicines after you have completely understood and accpet all the possible adverse reactions/side effects.   Do not drive, operating heavy machinery, perform activities at heights, swimming or participation in water activities or provide baby sitting services if your were admitted for syncope or siezures until you have seen by Primary MD or a Neurologist and advised to do so again.  Do not drive when taking Pain medications.    Do not take more than prescribed Pain, Sleep and Anxiety Medications  Special Instructions: If you have smoked or chewed Tobacco   in the last 2 yrs please stop smoking, stop any regular Alcohol  and or any Recreational drug use.  Wear Seat belts while driving.   Please note  You were cared for by a hospitalist during your hospital stay. If you have any questions about your discharge medications or the care you received while you were in the hospital after you are discharged, you can call the unit and asked to speak with the hospitalist on call if the hospitalist that took care of you is not available. Once you are discharged, your primary care physician will handle any further medical issues. Please note that NO REFILLS for any discharge medications will be authorized once you are discharged, as it is imperative that you return to your primary care physician (or establish a relationship with a primary care physician if you do not have one) for your aftercare needs so that they can reassess your need for medications and monitor your lab values.  Loop recorder insertion site care instructions: - Please keep wound clean and dry for 3 days, wound check in 10 days on previously scheduled appointment on 8/31st

## 2015-05-21 ENCOUNTER — Ambulatory Visit (INDEPENDENT_AMBULATORY_CARE_PROVIDER_SITE_OTHER): Payer: BC Managed Care – PPO | Admitting: *Deleted

## 2015-05-21 DIAGNOSIS — I639 Cerebral infarction, unspecified: Secondary | ICD-10-CM

## 2015-05-21 LAB — CUP PACEART INCLINIC DEVICE CHECK
MDC IDC SESS DTM: 20160831113511
MDC IDC SET ZONE DETECTION INTERVAL: 370 ms
Zone Setting Detection Interval: 2000 ms
Zone Setting Detection Interval: 3000 ms

## 2015-05-21 NOTE — Progress Notes (Addendum)
Loop check in clinic. Wound edges approximated, no redness, swelling or drainage. Pt educated about wound care. Battery- good. R waves 0.58mV. Pt with no episodes. [facility] Summary Reports, ROV with [facility] 08/06/15.  Pt confused about medication regimen based on hospital discharge paperwork- reviewed discharge summary. Pt called to clarify medications: stop taking metformin and sulfa antibiotics-- only taking glipizide for diabetes management.

## 2015-05-23 ENCOUNTER — Telehealth: Payer: Self-pay

## 2015-05-23 ENCOUNTER — Ambulatory Visit: Payer: BC Managed Care – PPO | Attending: Internal Medicine

## 2015-05-23 DIAGNOSIS — R2681 Unsteadiness on feet: Secondary | ICD-10-CM | POA: Insufficient documentation

## 2015-05-23 DIAGNOSIS — R269 Unspecified abnormalities of gait and mobility: Secondary | ICD-10-CM

## 2015-05-23 NOTE — Telephone Encounter (Signed)
I spoke to the patient in re the Respect-ESUS study. The patient is interested in participating. Therefore, a screening visit was scheduled for [id number] at 10:30h with Dr. Erlinda Hong.

## 2015-05-23 NOTE — Therapy (Signed)
[facility] [address] [place], [place], [address] Phone: [phone]   Fax:  [phone]  Physical Therapy Evaluation  Patient Details  Name: Trevor Wheeler MRN: [id number] Date of Birth: [birth year] Referring Provider:  Albertine Patricia, MD  Encounter Date: 05/23/2015      PT End of Session - 05/23/15 1312    Visit Number 1   Number of Visits 9   Date for PT Re-Evaluation 06/22/15   Authorization Type Pt does not have Medicare, but is applying for Medicare, so PT will put in a G-code.   PT Start Time 1147   PT Stop Time 1225   PT Time Calculation (min) 38 min   Activity Tolerance Patient tolerated treatment well   Behavior During Therapy WFL for tasks assessed/performed      Past Medical History  Diagnosis Date  . Hypertension   . Type II diabetes mellitus dx'd 05/05/2015  . CVA (cerebral vascular accident) 05/04/2015    L cerebellar, "only problem I have is w/balance" 05/05/2015)    Past Surgical History  Procedure Laterality Date  . No past surgeries    . Ep implantable device N/A 05/07/2015    Procedure: Loop Recorder Insertion;  Surgeon: Sanda Klein, MD;  Location: [facility] CV LAB;  Service: Cardiovascular;  Laterality: N/A;  . Tee without cardioversion N/A 05/07/2015    Procedure: TRANSESOPHAGEAL ECHOCARDIOGRAM (TEE);  Surgeon: Skeet Latch, MD;  Location: [facility] ENDOSCOPY;  Service: Cardiovascular;  Laterality: N/A;    There were no vitals filed for this visit.  Visit Diagnosis:  Abnormality of gait - Plan: PT plan of care cert/re-cert  Unsteadiness - Plan: PT plan of care cert/re-cert      Subjective Assessment - 05/23/15 1152    Subjective Pt reported he had a CVA (05/07/15) which impaired his balance, ability to walk, and strength. Pt went to the hospital the next day (05/08/15), his balance was still off but he went to work, then decided to go to the hospital.  Pt was at [facility] for 4  days, and received acute care PT. Pt reports his balance and strength is improving, but he still has difficulty with balance due to dizziness. Pt is now able to walk about 1.25 miles with dog.  Pt reported he experiences lightheadedness intermittently, especially after sitting down and then standing or during walking. Pt has been charting BP and it ranges in the 140's/80-90's.   Pertinent History DM (recently diagnosed)   Patient Stated Goals Get rid of lightheadedness, improve endurance and balance   Currently in Pain? No/denies            [facility] PT Assessment - 05/23/15 1158    Assessment   Medical Diagnosis CVA   Onset Date/Surgical Date 05/07/15   Hand Dominance Right   Prior Therapy acute care PT   Precautions   Precautions Fall   Precaution Comments based on FGA score.   Restrictions   Weight Bearing Restrictions No   Balance Screen   Has the patient fallen in the past 6 months Yes   How many times? 1  when he woke up with symptoms of CVA   Has the patient had a decrease in activity level because of a fear of falling?  Yes  fearful of falling 2/2 impaired balance   Is the patient reluctant to leave their home because of a fear of falling?  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, standing, assembly of cabinets, sheetrock, demo work   Leisure walking, tennis,  Research officer, political party, Animator, Scientist, research (life sciences)   Overall Cognitive Status Within Functional Limits for tasks assessed   Sensation   Light Touch Appears Intact   Additional Comments No N/T   Coordination   Gross Motor Movements are Fluid and Coordinated Yes   Fine Motor Movements are Fluid and Coordinated Yes   Posture/Postural Control   Posture/Postural Control No significant limitations   Tone   Assessment Location Other (comment)   ROM / Strength   AROM / PROM / Strength AROM;Strength   AROM   Overall AROM  Within functional limits for tasks performed   Strength   Overall Strength Within functional limits for tasks performed   Overall Strength Comments B UE WFL. B LE WFL: 4+/5 to 5/5 globally.   Transfers   Transfers Sit to Stand;Stand to Sit   Sit to Stand 5: Supervision   Stand to Sit 5: Supervision   Ambulation/Gait   Ambulation/Gait Yes   Ambulation/Gait Assistance 5: Supervision   Ambulation/Gait Assistance Details No overt LOB episodes, but pt noted to amb. in a guarded manner.   Ambulation Distance (Feet) 200 Feet   Assistive device None   Gait Pattern Step-through pattern;Wide base of support;Decreased trunk rotation   Ambulation Surface Level;Indoor   Gait velocity 2.82ft/sec.   Balance   Balance Assessed Yes   Static Standing Balance   Static Standing - Balance Support No upper extremity supported   Static Standing - Level of Assistance 4: Min assist;Other (comment)  min guard   Static Standing Balance -  Activities  Single Leg Stance - Right Leg;Single Leg Stance - Left Leg   Static Standing - Comment/# of Minutes R LE: 4 seconds; L LE: 2 seconds   Standardized Balance Assessment   Standardized Balance Assessment Timed Up and Go Test   Timed Up and Go Test   TUG Normal TUG   Normal TUG (seconds) 10.48  no AD   Functional Gait  Assessment   Gait assessed  Yes   Gait Level Surface Walks 20 ft in less than 5.5 sec, no assistive devices, good speed, no  evidence for imbalance, normal gait pattern, deviates no more than 6 in outside of the 12 in walkway width.  4.53   Change in Gait Speed Able to change speed, demonstrates mild gait deviations, deviates 6-10 in outside of the 12 in walkway width, or no gait deviations, unable to achieve a major change in velocity, or uses a change in velocity, or uses an assistive device.   Gait with Horizontal Head Turns Performs head turns smoothly with slight change in gait velocity (eg, minor disruption to smooth gait path), deviates 6-10 in outside 12 in walkway width, or uses an assistive device.   Gait with Vertical Head Turns Performs task with slight change in gait velocity (  eg, minor disruption to smooth gait path), deviates 6 - 10 in outside 12 in walkway width or uses assistive device   Gait and Pivot Turn Turns slowly, requires verbal cueing, or requires several small steps to catch balance following turn and stop   Step Over Obstacle Is able to step over one shoe box (4.5 in total height) but must slow down and adjust steps to clear box safely. May require verbal cueing.   Gait with Narrow Base of Support Ambulates less than 4 steps heel to toe or cannot perform without assistance.  3 steps   Gait with Eyes Closed Walks 20 ft, uses assistive device, slower speed, mild gait deviations, deviates 6-10 in outside 12 in walkway width. Ambulates 20 ft in less than 9 sec but greater than 7 sec.  7.14 and deviated approx. 8"   Ambulating Backwards Walks 20 ft, uses assistive device, slower speed, mild gait deviations, deviates 6-10 in outside 12 in walkway width.  13.5 seconds and deviated approx. 6"   Steps Alternating feet, must use rail.   Total Score 17                           PT Education - 05/23/15 1311    Education provided Yes   Education Details PT educated pt on notifying MD/going to ED if BP increases, as HTN could result in another CVA.   Person(s) Educated Patient    Methods Explanation   Comprehension Verbalized understanding          PT Short Term Goals - 05/23/15 1431    PT SHORT TERM GOAL #1   Title same as LTGs.           PT Long Term Goals - 05/23/15 1431    PT LONG TERM GOAL #1   Title Pt will verbalize understanding of CVA risk factors and signs/symptoms to decrease risk of future CVA. Target date: 06/20/15   Status New   PT LONG TERM GOAL #2   Title Pt will improve FGA score to 25/30 to decrease falls risk. Target date: 06/20/15.   Status New   PT LONG TERM GOAL #3   Title Pt will traverse 8 steps ind without UE support to perform work tasks and climb steps at home. Target date: 06/20/15.   Status New   PT LONG TERM GOAL #4   Title Pt will be able to carry 50 pounds 100', ind, without LOB in order to perform work duties ind. Target date: 06/20/15.   Status New   PT LONG TERM GOAL #5   Title Pt will be able to amb. 1000' over even/uneven terrain without LOB, ind, to improve functional mobility. Target date: 06/20/15.   Status New   Additional Long Term Goals   Additional Long Term Goals Yes   PT LONG TERM GOAL #6   Title Obtain pt's foto score and write goal. Target date:06/20/15.   Status New               Plan - 05/23/15 1313    Clinical Impression Statement Pt is a pleasant 69y/o male presenting to OPPT neuro after a CVA on 05/07/15. He presented with impaired balance, gait abnormalities, and falls risk per his FGA score. Pt also reported dizziness, which he described as light headedness, PT will assess next session.    Pt will benefit from skilled therapeutic intervention in order to improve on the following deficits Abnormal gait;Decreased mobility;Dizziness;Decreased  balance;Decreased knowledge of use of DME   Rehab Potential Good   PT Frequency 2x / week   PT Duration 4 weeks   PT Treatment/Interventions ADLs/Self Care Home Management;Neuromuscular re-education;Biofeedback;Canalith Repostioning;Gait training;Stair  training;Functional mobility training;Therapeutic activities;Therapeutic exercise;Balance training;Manual techniques;Vestibular;Orthotic Fit/Training;Patient/family education;DME Instruction   PT Next Visit Plan Initiate balance HEP and assess vestibular system   Consulted and Agree with Plan of Care Patient          G-Codes - 09-Jun-2015 1435    Functional Assessment Tool Used FGA: 17/30   Functional Limitation Mobility: Walking and moving around   Mobility: Walking and Moving Around Current Status [phone]) At least 20 percent but less than 40 percent impaired, limited or restricted   Mobility: Walking and Moving Around Goal Status [phone]) At least 1 percent but less than 20 percent impaired, limited or restricted       Problem List Patient Active Problem List   Diagnosis Date Noted  . Cryptogenic stroke   . Type II diabetes mellitus, uncontrolled 05/06/2015  . Dyslipidemia 05/06/2015  . Stroke   . CVA (cerebral infarction) 05/05/2015  . Nausea & vomiting 05/05/2015  . Balance disorder 05/05/2015  . 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 Acute CVA (cerebrovascular accident)   . HYPERLIPIDEMIA 08/12/2010    Gared Gillie L 06-09-2015, 2:37 PM  [facility] [address] [place], [place], [address] Phone: [phone]   Fax:  [phone]    Geoffry Paradise, PT,DPT 09-Jun-2015 2:37 PM Phone: [phone] Fax: [phone]

## 2015-05-28 ENCOUNTER — Ambulatory Visit: Payer: BC Managed Care – PPO | Admitting: Physical Therapy

## 2015-05-28 DIAGNOSIS — R269 Unspecified abnormalities of gait and mobility: Secondary | ICD-10-CM | POA: Diagnosis not present

## 2015-05-28 DIAGNOSIS — R2681 Unsteadiness on feet: Secondary | ICD-10-CM

## 2015-05-28 NOTE — Patient Instructions (Addendum)
Balance: Eyes Closed - Bilateral (Varied Surfaces)   Stand, feet shoulder width, close eyes. Maintain balance 20 seconds. Repeat 2 times per set. Do 2 times a day. Repeat on compliant surface: foam.  Copyright  VHI. All rights reserved.  Balance: Neck Rotation, Eyes Closed - Bilateral (Varied Surfaces)   Stand, feet shoulder width, close eyes. Balance and turn head and neck from side to side. Repeat 10 times per set. Repeat moving head up and down.  Do 2 sets per day.  Repeat on compliant surface: foam.  Copyright  VHI. All rights reserved.  Feet Together, Head Motion - Eyes Closed   With eyes closed and feet together, move head slowly, up and down. Repeat 10 times per session. Repeat moving head side to side.  Do 2 sessions per day.  Copyright  VHI. All rights reserved.  Feet Together (Compliant Surface) Head Motion - Eyes Closed   Stand on compliant surface: on pillow with feet together. Close eyes and move head slowly, up and down. Repeat 10 times per session. Repeat moving head side to side.  Do 2 sessions per day.  Copyright  VHI. All rights reserved.   Braiding   Move to side: 1) cross right leg in front of left, 2) bring back leg out to side, then 3) cross right leg behind left, 4) bring left leg out to side. Continue sequence in same direction. Reverse sequence, moving in opposite direction. Repeat sequence 4 times in front of counter. Do 2 sessions per day.  Copyright  VHI. All rights reserved.  SINGLE LIMB STANCE   Stance: single leg on floor. Raise leg. Hold 10 seconds. Repeat with other leg. 3 reps per set, 2 sets per day.  Copyright  VHI. All rights reserved.  Tandem Stance   Right foot in front of left, heel touching toe both feet "straight ahead". Stand on Foot Triangle of Support with both feet. Balance in this position 20 seconds. Do with left foot in front of right.  Copyright  VHI. All rights reserved.  Feet Heel-Toe "Tandem"   Arms by  side holding counter if needed, walk a straight line bringing one foot directly in front of the other. Do length of counter x 4. Do 2 sessions per day.  Copyright  VHI. All rights reserved.

## 2015-05-28 NOTE — Therapy (Signed)
[facility] [address] [place], [place], [address] Phone: [phone]   Fax:  [phone]  Physical Therapy Treatment  Patient Details  Name: Trevor Wheeler MRN: [id number] Date of Birth: [birth year] Referring Provider:  Albertine Patricia, MD  Encounter Date: 05/28/2015      PT End of Session - 05/28/15 1235    Visit Number 2   Number of Visits 9   Date for PT Re-Evaluation 06/22/15   Authorization Type Pt does not have Medicare, but is applying for Medicare, so PT will put in a G-code.   PT Start Time 1148   PT Stop Time 1232   PT Time Calculation (min) 44 min   Activity Tolerance Patient tolerated treatment well   Behavior During Therapy WFL for tasks assessed/performed      Past Medical History  Diagnosis Date  . Hypertension   . Type II diabetes mellitus dx'd 05/05/2015  . CVA (cerebral vascular accident) 05/04/2015    L cerebellar, "only problem I have is w/balance" 05/05/2015)    Past Surgical History  Procedure Laterality Date  . No past surgeries    . Ep implantable device N/A 05/07/2015    Procedure: Loop Recorder Insertion;  Surgeon: Sanda Klein, MD;  Location: [facility] CV LAB;  Service: Cardiovascular;  Laterality: N/A;  . Tee without cardioversion N/A 05/07/2015    Procedure: TRANSESOPHAGEAL ECHOCARDIOGRAM (TEE);  Surgeon: Skeet Latch, MD;  Location: [facility] ENDOSCOPY;  Service: Cardiovascular;  Laterality: N/A;    There were no vitals filed for this visit.  Visit Diagnosis:  Unsteadiness      Subjective Assessment - 05/28/15 1154    Subjective Pt denies falls or changes since last visit.  BP has been stable.  Pt brought BP and blood sugar log in today.  Still has dizziness when bends over at times.   Pertinent History DM (recently diagnosed)   Patient Stated Goals Get rid of lightheadedness, improve endurance and balance   Currently in Pain? No/denies        Therapy treatment  consisted of performing HEP as provided for balance as well as [facility] taps, double taps, tipping/uprighting on compliant and non compliant surfaces.        PT Education - 05/28/15 1235    Education provided Yes   Education Details HEP   Person(s) Educated Patient   Methods Explanation;Demonstration;Handout   Comprehension Verbalized understanding;Returned demonstration          PT Short Term Goals - 05/23/15 1431    PT SHORT TERM GOAL #1   Title same as LTGs.           PT Long Term Goals - 05/23/15 1431    PT LONG TERM GOAL #1   Title Pt will verbalize understanding of CVA risk factors and signs/symptoms to decrease risk of future CVA. Target date: 06/20/15   Status New   PT LONG TERM GOAL #2   Title Pt will improve FGA score to 25/30 to decrease falls risk. Target date: 06/20/15.   Status New   PT LONG TERM GOAL #3   Title Pt will traverse 8 steps ind without UE support to perform work tasks and climb steps at home. Target date: 06/20/15.   Status New   PT LONG TERM GOAL #4   Title Pt will be able to carry 50 pounds 100', ind, without LOB in order to perform work duties ind. Target date: 06/20/15.   Status New   PT LONG TERM  GOAL #5   Title Pt will be able to amb. 1000' over even/uneven terrain without LOB, ind, to improve functional mobility. Target date: 06/20/15.   Status New   Additional Long Term Goals   Additional Long Term Goals Yes   PT LONG TERM GOAL #6   Title Obtain pt's foto score and write goal. Target date:06/20/15.   Status New               Plan - 05/28/15 1236    Clinical Impression Statement Pt tolerated treatment well with no rest breaks needed during session.  Does have difficulty with balance on compliant surfaces and decreased BOS.  Tends to ambulate and stand with wide base.  Continue PT per POC.   Pt will benefit from skilled therapeutic intervention in order to improve on the following deficits Abnormal gait;Decreased  mobility;Dizziness;Decreased balance;Decreased knowledge of use of DME   PT Frequency 2x / week   PT Treatment/Interventions ADLs/Self Care Home Management;Neuromuscular re-education;Biofeedback;Canalith Repostioning;Gait training;Stair training;Functional mobility training;Therapeutic activities;Therapeutic exercise;Balance training;Manual techniques;Vestibular;Orthotic Fit/Training;Patient/family education;DME Instruction   PT Next Visit Plan Continue to assess vestibular system, balance on compliant surfaces.   Consulted and Agree with Plan of Care Patient        Problem List Patient Active Problem List   Diagnosis Date Noted  . Cryptogenic stroke   . Type II diabetes mellitus, uncontrolled 05/06/2015  . Dyslipidemia 05/06/2015  . Stroke   . CVA (cerebral infarction) 05/05/2015  . Nausea & vomiting 05/05/2015  . Balance disorder 05/05/2015  . 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 Acute CVA (cerebrovascular accident)   . HYPERLIPIDEMIA 08/12/2010    Narda Bonds 05/28/2015, 12:38 PM  [facility] [address] [place], [place], [address] Phone: [phone]   Fax:  [place], [place] 05/28/2015 12:38 PM Phone: [phone] Fax: [phone]

## 2015-05-30 ENCOUNTER — Telehealth: Payer: Self-pay | Admitting: *Deleted

## 2015-05-30 ENCOUNTER — Ambulatory Visit: Payer: BC Managed Care – PPO

## 2015-05-30 DIAGNOSIS — R269 Unspecified abnormalities of gait and mobility: Secondary | ICD-10-CM | POA: Diagnosis not present

## 2015-05-30 DIAGNOSIS — R2681 Unsteadiness on feet: Secondary | ICD-10-CM

## 2015-05-30 NOTE — Therapy (Signed)
[facility] [address] [place], [place], [address] Phone: [phone]   Fax:  [phone]  Physical Therapy Treatment  Patient Details  Name: Trevor Wheeler MRN: [id number] Date of Birth: 06-30-1946 Referring Provider:  Albertine Patricia, MD  Encounter Date: 05/30/2015      PT End of Session - 05/30/15 1509    Visit Number 3   Number of Visits 9   Date for PT Re-Evaluation 06/22/15   Authorization Type Pt does not have Medicare, but is applying for Medicare, so PT will put in a G-code.   PT Start Time 1231   PT Stop Time 1314   PT Time Calculation (min) 43 min   Equipment Utilized During Treatment Gait belt   Activity Tolerance Patient tolerated treatment well   Behavior During Therapy WFL for tasks assessed/performed      Past Medical History  Diagnosis Date  . Hypertension   . Type II diabetes mellitus dx'd 05/05/2015  . CVA (cerebral vascular accident) 05/04/2015    L cerebellar, "only problem I have is w/balance" 05/05/2015)    Past Surgical History  Procedure Laterality Date  . No past surgeries    . Ep implantable device N/A 05/07/2015    Procedure: Loop Recorder Insertion;  Surgeon: Sanda Klein, MD;  Location: [place] [facility];  Service: Cardiovascular;  Laterality: N/A;  . Tee without cardioversion N/A 05/07/2015    Procedure: TRANSESOPHAGEAL ECHOCARDIOGRAM (TEE);  Surgeon: Skeet Latch, MD;  Location: [facility] ENDOS[facility];  Service: Cardiovascular;  Laterality: N/A;    There were no vitals filed for this visit.  Visit Diagnosis:  Abnormality of gait  Unsteadiness      Subjective Assessment - 05/30/15 1235    Subjective Pt denied falls or changes since last visit. Pt reported dizziness has not been as bad.    Pertinent History DM (recently diagnosed)   Patient Stated Goals Get rid of lightheadedness, improve endurance and balance   Currently in Pain? No/denies                 Vestibular Assessment - 05/30/15 1236    Symptom Behavior   Type of Dizziness Lightheadedness   Frequency of Dizziness every day   Duration of Dizziness lasts up to 30 minutes and then goes away   Aggravating Factors Supine to sit  bending down   Occulomotor Exam   Occulomotor Alignment Normal   Head shaking Horizontal --  WNL   Head Shaking Vertical --  WNL   Smooth Pursuits Intact   Saccades Intact   Vestibulo-Occular Reflex   VOR 1 Head Only (x 1 viewing) WNL   Positional Sensitivities   Sit to Supine No dizziness   Nose to Right Knee No dizziness   Nose to Left Knee No dizziness   Positional Sensitivities Comments 1/10 when bending down to pick something up from floor, at its worst: 10/10 and pt will stagger.    Orthostatics   BP supine (x 5 minutes) 149/86 mmHg   HR supine (x 5 minutes) 79   BP sitting 154/91 mmHg   HR sitting 87   BP standing (after 1 minute) 151/86 mmHg   HR standing (after 1 minute) 90   Orthostatics Comment No symptoms of dizziness.                 [facility] Adult PT Treatment/Exercise - 05/30/15 1503    High Level Balance   High Level Balance Activities Backward walking;Head turns;Tandem walking  High Level Balance Comments In // bars, no UE support, with min guard to min A (6x7'/activity): tandem amb. on foam, pt ambulated over non-compliant surface while performing head turns, ambulated backwards with and without PT applying resistance. Cues for technique and to improve stride length and lateral weight shifting.             Balance Exercises - 05/30/15 1505    Balance Exercises: Standing   Other Standing Exercises In // bars, no UE support, with min guard to min A: pt performed single and double cone taps on red mat and airex with and without looking at cones to improve proprioception. Ball toss/catch on airex with feet apart/together, SLS on soccer ball moving ball in ant/post/lat/circular directions. Cues for technique and to  improve lat. wt. shifting. 10reps/LE/activity.           PT Education - 05/30/15 1508    Education provided Yes   Education Details PT discussed tall kneeling vs. crouching stance to improve blood flow upon standing, as pt reports lightheadedness upon standing after crouching for prolonged periods of time.   Person(s) Educated Patient   Methods Explanation   Comprehension Verbalized understanding          PT Short Term Goals - 05/23/15 1431    PT SHORT TERM GOAL #1   Title same as LTGs.           PT Long Term Goals - 05/30/15 1511    PT LONG TERM GOAL #1   Title Pt will verbalize understanding of CVA risk factors and signs/symptoms to decrease risk of future CVA. Target date: 06/20/15   Status On-going   PT LONG TERM GOAL #2   Title Pt will improve FGA score to 25/30 to decrease falls risk. Target date: 06/20/15.   Status On-going   PT LONG TERM GOAL #3   Title Pt will traverse 8 steps ind without UE support to perform work tasks and climb steps at home. Target date: 06/20/15.   Status On-going   PT LONG TERM GOAL #4   Title Pt will be able to carry 50 pounds 100', ind, without LOB in order to perform work duties ind. Target date: 06/20/15.   Status On-going   PT LONG TERM GOAL #5   Title Pt will be able to amb. 1000' over even/uneven terrain without LOB, ind, to improve functional mobility. Target date: 06/20/15.   Status On-going   PT LONG TERM GOAL #6   Title Obtain pt's foto score and write goal. Target date:06/20/15.   Status On-going               Plan - 05/30/15 1509    Clinical Impression Statement Pt's vestibular assessment was negative for vestibular impairment and orthostatic hypotension. Pt's lightheadedness appears to be from circulatory issues, as he experiences symptoms after bending/crouching for prolonged periods of time. Pt's BP was elevated but did not increase/decrease significantly during positional changes, PT will route note to MD per pt's  request. Pt continues to experience increased postural sway and LOB during SLS activities and would continue to benefit from skilled PT to improve safety during functional moiblity.   Pt will benefit from skilled therapeutic intervention in order to improve on the following deficits Abnormal gait;Decreased mobility;Dizziness;Decreased balance;Decreased knowledge of use of DME   Rehab Potential Good   PT Frequency 2x / week   PT Duration 4 weeks   PT Treatment/Interventions ADLs/Self Care Home Management;Neuromuscular re-education;Biofeedback;Canalith Repostioning;Gait training;Stair training;Functional mobility training;Therapeutic  activities;Therapeutic exercise;Balance training;Manual techniques;Vestibular;Orthotic Fit/Training;Patient/family education;DME Instruction   PT Next Visit Plan Practice proper lifting technique, amb. while carrying heavy objects, balance on compliant surfaces.   PT Home Exercise Plan balance HEP   Consulted and Agree with Plan of Care Patient        Problem List Patient Active Problem List   Diagnosis Date Noted  . Cryptogenic stroke   . Type II diabetes mellitus, uncontrolled 05/06/2015  . Dyslipidemia 05/06/2015  . Stroke   . CVA (cerebral infarction) 05/05/2015  . Nausea & vomiting 05/05/2015  . Balance disorder 05/05/2015  . 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 Acute CVA (cerebrovascular accident)   . HYPERLIPIDEMIA 08/12/2010    Miller,Jennifer L 05/30/2015, 3:11 PM  [facility] [address] [place], [place], [address] Phone: [phone]   Fax:  [phone]     Geoffry Paradise, PT,DPT 05/30/2015 3:11 PM Phone: [phone] Fax: [phone]

## 2015-05-30 NOTE — Telephone Encounter (Signed)
Rn left message for Trevor Wheeler in human resources. Rn explain pt has a new patient appt on 06-11-15. Pt wanted FMLA forms filled out.

## 2015-05-30 NOTE — Telephone Encounter (Signed)
Form,Fmla  [employer] received from Shelly sent to [place] and Dr Erlinda Hong 05/30/15.

## 2015-06-02 DIAGNOSIS — Z0289 Encounter for other administrative examinations: Secondary | ICD-10-CM

## 2015-06-04 ENCOUNTER — Ambulatory Visit: Payer: BC Managed Care – PPO | Admitting: Physical Therapy

## 2015-06-04 VITALS — BP 199/85 | HR 101

## 2015-06-04 DIAGNOSIS — R269 Unspecified abnormalities of gait and mobility: Secondary | ICD-10-CM | POA: Diagnosis not present

## 2015-06-04 DIAGNOSIS — R2681 Unsteadiness on feet: Secondary | ICD-10-CM

## 2015-06-04 NOTE — Therapy (Signed)
[facility] [address] [place], [place], [address] Phone: [phone]   Fax:  [phone]  Physical Therapy Treatment  Patient Details  Name: Trevor Wheeler MRN: [id number] Date of Birth: 12-Jul-1946 Referring Provider:  Albertine Patricia, MD  Encounter Date: 06/04/2015      PT End of Session - 06/04/15 1715    Visit Number 4   Number of Visits 9   Date for PT Re-Evaluation 06/22/15   Authorization Type Pt does not have Medicare, but is applying for Medicare, so PT will put in a G-code.   PT Start Time 1018   PT Stop Time 1103   PT Time Calculation (min) 45 min   Equipment Utilized During Treatment Gait belt   Activity Tolerance Patient tolerated treatment well   Behavior During Therapy WFL for tasks assessed/performed      Past Medical History  Diagnosis Date  . Hypertension   . Type II diabetes mellitus dx'd 05/05/2015  . CVA (cerebral vascular accident) 05/04/2015    L cerebellar, "only problem I have is w/balance" 05/05/2015)    Past Surgical History  Procedure Laterality Date  . No past surgeries    . Ep implantable device N/A 05/07/2015    Procedure: Loop Recorder Insertion;  Surgeon: Sanda Klein, MD;  Location: [facility];  Service: Cardiovascular;  Laterality: N/A;  . Tee without cardioversion N/A 05/07/2015    Procedure: TRANSESOPHAGEAL ECHOCARDIOGRAM (TEE);  Surgeon: Skeet Latch, MD;  Location: [facility];  Service: Cardiovascular;  Laterality: N/A;    Filed Vitals:   06/04/15 0900 06/04/15 1056  BP: 162/96 199/85  Pulse: 90 101    Visit Diagnosis:  Unsteadiness  Abnormality of gait      Subjective Assessment - 06/04/15 1021    Subjective Pt denies changes or falls.  Did have slight head ache this morning.  Dizziness is better.  Had some this morning with "quick turn".   Patient Stated Goals Get rid of lightheadedness, improve endurance and balance   Currently in Pain?  No/denies       Carrying box with 30# weights, transitioning from mat to counter to floor.  Up/down steps with weighted box with LOB coming down with min assist to recover. Step ups to 6"step with intermittent UE support x 15, step downs 6" step with intermittent UE support x 15   Rockerboard both directions with head turns/nods and with eyes closed.  Bosu both surfaces with head turns/nods and squats x 10 on black surface of BOSU. Stepping onto 6" step then tap with opposite foot to chair and back to ground x 15 each side for balance.  Repeated carrying box with 20# weights and up/down steps with weighted box with no LOB.       [facility] Adult PT Treatment/Exercise - 06/04/15 0001    Transfers   Transfers Sit to Stand;Stand to Sit   Sit to Stand 7: Independent   Stand to Sit 7: Independent   Knee/Hip Exercises: Aerobic   Elliptical x 1 minute forward and 1 minute backward at level 2.0 for endurance and balance                PT Education - 06/04/15 1714    Education provided Yes   Education Details Increase in BP with activity and upper limits of BP   Person(s) Educated Patient   Methods Explanation   Comprehension Verbalized understanding          PT Short Term  Goals - 05/23/15 1431    PT SHORT TERM GOAL #1   Title same as LTGs.           PT Long Term Goals - 05/30/15 1511    PT LONG TERM GOAL #1   Title Pt will verbalize understanding of CVA risk factors and signs/symptoms to decrease risk of future CVA. Target date: 06/20/15   Status On-going   PT LONG TERM GOAL #2   Title Pt will improve FGA score to 25/30 to decrease falls risk. Target date: 06/20/15.   Status On-going   PT LONG TERM GOAL #3   Title Pt will traverse 8 steps ind without UE support to perform work tasks and climb steps at home. Target date: 06/20/15.   Status On-going   PT LONG TERM GOAL #4   Title Pt will be able to carry 50 pounds 100', ind, without LOB in order to perform work duties  ind. Target date: 06/20/15.   Status On-going   PT LONG TERM GOAL #5   Title Pt will be able to amb. 1000' over even/uneven terrain without LOB, ind, to improve functional mobility. Target date: 06/20/15.   Status On-going   PT LONG TERM GOAL #6   Title Obtain pt's foto score and write goal. Target date:06/20/15.   Status On-going               Plan - 06/04/15 1715    Clinical Impression Statement Pt with increased difficulty/decreased balance when going down stairs carrying weight but improved with practice and slight decrease in weight amount.  Pt with increase in BP with activity today and reports he has noticed this at home as well but recovers within 10 minutes per pt.  Continues with difficulty with SLS.  Continute PT per POC.   Pt will benefit from skilled therapeutic intervention in order to improve on the following deficits Abnormal gait;Decreased mobility;Dizziness;Decreased balance;Decreased knowledge of use of DME   Rehab Potential Good   PT Frequency 2x / week   PT Duration 4 weeks   PT Treatment/Interventions ADLs/Self Care Home Management;Neuromuscular re-education;Biofeedback;Canalith Repostioning;Gait training;Stair training;Functional mobility training;Therapeutic activities;Therapeutic exercise;Balance training;Manual techniques;Vestibular;Orthotic Fit/Training;Patient/family education;DME Instruction   PT Next Visit Plan Practice proper lifting technique, amb. and up/down stairs while carrying heavy objects,    PT Home Exercise Plan balance HEP   Consulted and Agree with Plan of Care Patient        Problem List Patient Active Problem List   Diagnosis Date Noted  . Cryptogenic stroke   . Type II diabetes mellitus, uncontrolled 05/06/2015  . Dyslipidemia 05/06/2015  . Stroke   . CVA (cerebral infarction) 05/05/2015  . Nausea & vomiting 05/05/2015  . Balance disorder 05/05/2015  . 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 Acute CVA (cerebrovascular accident)   . HYPERLIPIDEMIA  08/12/2010    Narda Bonds 06/04/2015, 5:19 PM  [facility] [address] [place], [place], [address] Phone: [phone]   Fax:  [place], [place] Jane Lew 06/04/2015 5:19 PM Phone: [phone] Fax: [phone]

## 2015-06-06 ENCOUNTER — Ambulatory Visit: Payer: BC Managed Care – PPO

## 2015-06-06 ENCOUNTER — Ambulatory Visit (INDEPENDENT_AMBULATORY_CARE_PROVIDER_SITE_OTHER): Payer: BC Managed Care – PPO | Admitting: *Deleted

## 2015-06-06 VITALS — BP 153/87 | HR 90

## 2015-06-06 DIAGNOSIS — R269 Unspecified abnormalities of gait and mobility: Secondary | ICD-10-CM

## 2015-06-06 DIAGNOSIS — I639 Cerebral infarction, unspecified: Secondary | ICD-10-CM

## 2015-06-06 DIAGNOSIS — R2681 Unsteadiness on feet: Secondary | ICD-10-CM

## 2015-06-06 NOTE — Patient Instructions (Signed)
Ischemic Stroke °A stroke (cerebrovascular accident) is the sudden death of brain tissue. It is a medical emergency. A stroke can cause permanent loss of brain function. This can cause problems with different parts of your body. A transient ischemic attack (TIA) is different because it does not cause permanent damage. A TIA is a short-lived problem of poor blood flow affecting a part of the brain. A TIA is also a serious problem because having a TIA greatly increases the chances of having a stroke. When symptoms first develop, you cannot know if the problem might be a stroke or a TIA. °CAUSES  °A stroke is caused by a decrease of oxygen supply to an area of your brain. It is usually the result of a small blood clot or collection of cholesterol or fat (plaque) that blocks blood flow in the brain. A stroke can also be caused by blocked or damaged carotid arteries.  °RISK FACTORS °· High blood pressure (hypertension). °· High cholesterol. °· Diabetes mellitus. °· Heart disease. °· The buildup of plaque in the blood vessels (peripheral artery disease or atherosclerosis). °· The buildup of plaque in the blood vessels providing blood and oxygen to the brain (carotid artery stenosis). °· An abnormal heart rhythm (atrial fibrillation). °· Obesity. °· Smoking. °· Taking oral contraceptives (especially in combination with smoking). °· Physical inactivity. °· A diet high in fats, salt (sodium), and calories. °· Alcohol use. °· Use of illegal drugs (especially cocaine and methamphetamine). °· Being African American. °· Being over the age of 55. °· Family history of stroke. °· Previous history of blood clots, stroke, TIA, or heart attack. °· Sickle cell disease. °SYMPTOMS  °These symptoms usually develop suddenly, or may be newly present upon awakening from sleep: °· Sudden weakness or numbness of the face, arm, or leg, especially on one side of the body. °· Sudden trouble walking or difficulty moving arms or legs. °· Sudden  confusion. °· Sudden personality changes. °· Trouble speaking (aphasia) or understanding. °· Difficulty swallowing. °· Sudden trouble seeing in one or both eyes. °· Double vision. °· Dizziness. °· Loss of balance or coordination. °· Sudden severe headache with no known cause. °· Trouble reading or writing. °DIAGNOSIS  °Your health care provider can often determine the presence or absence of a stroke based on your symptoms, history, and physical exam. Computed tomography (CT) of the brain is usually performed to confirm the stroke, determine causes, and determine stroke severity. Other tests may be done to find the cause of the stroke. These tests may include: °· Electrocardiography. °· Continuous heart monitoring. °· Echocardiography. °· Carotid ultrasonography. °· Magnetic resonance imaging (MRI). °· A scan of the brain circulation. °· Blood tests. °PREVENTION  °The risk of a stroke can be decreased by appropriately treating high blood pressure, high cholesterol, diabetes, heart disease, and obesity and by quitting smoking, limiting alcohol, and staying physically active. °TREATMENT  °Time is of the essence. It is important to seek treatment at the first sign of these symptoms because you may receive a medicine to dissolve the clot (thrombolytic) that cannot be given if too much time has passed since your symptoms began. Even if you do not know when your symptoms began, get treatment as soon as possible as there are other treatment options available including oxygen, intravenous (IV) fluids, and medicines to thin the blood (anticoagulants). Treatment of stroke depends on the duration, severity, and cause of your symptoms. Medicines and dietary changes may be used to address diabetes, high blood   pressure, and other risk factors. Physical, speech, and occupational therapists will assess you and work with you to improve any functions impaired by the stroke. Measures will be taken to prevent short-term and long-term  complications, including infection from breathing foreign material into the lungs (aspiration pneumonia), blood clots in the legs, bedsores, and falls. Rarely, surgery may be needed to remove large blood clots or to open up blocked arteries. °HOME CARE INSTRUCTIONS  °· Take medicines only as directed by your health care provider. Follow the directions carefully. Medicines may be used to control risk factors for a stroke. Be sure you understand all your medicine instructions. °· You may be told to take a medicine to thin the blood, such as aspirin or the anticoagulant warfarin. Warfarin needs to be taken exactly as instructed. °¨ Too much and too little warfarin are both dangerous. Too much warfarin increases the risk of bleeding. Too little warfarin continues to allow the risk for blood clots. While taking warfarin, you will need to have regular blood tests to measure your blood clotting time. These blood tests usually include both the PT and INR tests. The PT and INR results allow your health care provider to adjust your dose of warfarin. The dose can change for many reasons. It is critically important that you take warfarin exactly as prescribed, and that you have your PT and INR levels drawn exactly as directed. °¨ Many foods, especially foods high in vitamin K, can interfere with warfarin and affect the PT and INR results. Foods high in vitamin K include spinach, kale, broccoli, cabbage, collard and turnip greens, brussels sprouts, peas, cauliflower, seaweed, and parsley, as well as beef and pork liver, green tea, and soybean oil. You should eat a consistent amount of foods high in vitamin K. Avoid major changes in your diet, or notify your health care provider before changing your diet. Arrange a visit with a dietitian to answer your questions. °¨ Many medicines can interfere with warfarin and affect the PT and INR results. You must tell your health care provider about any and all medicines you take. This  includes all vitamins and supplements. Be especially cautious with aspirin and anti-inflammatory medicines. Do not take or discontinue any prescribed or over-the-counter medicine except on the advice of your health care provider or pharmacist. °¨ Warfarin can have side effects, such as excessive bruising or bleeding. You will need to hold pressure over cuts for longer than usual. Your health care provider or pharmacist will discuss other potential side effects. °¨ Avoid sports or activities that may cause injury or bleeding. °¨ Be mindful when shaving, flossing your teeth, or handling sharp objects. °¨ Alcohol can change the body's ability to handle warfarin. It is best to avoid alcoholic drinks or consume only very small amounts while taking warfarin. Notify your health care provider if you change your alcohol intake. °¨ Notify your dentist or other health care providers before procedures. °· If swallow studies have determined that your swallowing reflex is present, you should eat healthy foods. Including 5 or more servings of fruits and vegetables a day may reduce the risk of stroke. Foods may need to be a certain consistency (soft or pureed), or small bites may need to be taken in order to avoid aspirating or choking. Certain dietary changes may be advised to address high blood pressure, high cholesterol, diabetes, or obesity. °¨ Food choices that are low in sodium, saturated fat, trans fat, and cholesterol are recommended to manage high blood pressure. °¨   Food choies that are high in fiber, and low in saturated fat, trans fat, and cholesterol may control cholesterol levels. °¨ Controlling carbohydrates and sugar intake is recommended to manage diabetes. °¨ Reducing calorie intake and making food choices that are low in sodium, saturated fat, trans fat, and cholesterol are recommended to manage obesity. °· Maintain a healthy weight. °· Stay physically active. It is recommended that you get at least 30 minutes of  activity on all or most days. °· Do not use any tobacco products including cigarettes, chewing tobacco, or electronic cigarettes. °· Limit alcohol use even if you are not taking warfarin. Moderate alcohol use is considered to be: °¨ No more than 2 drinks each day for men. °¨ No more than 1 drink each day for nonpregnant women. °· Home safety. A safe home environment is important to reduce the risk of falls. Your health care provider may arrange for specialists to evaluate your home. Having grab bars in the bedroom and bathroom is often important. Your health care provider may arrange for equipment to be used at home, such as raised toilets and a seat for the shower. °· Physical, occupational, and speech therapy. Ongoing therapy may be needed to maximize your recovery after a stroke. If you have been advised to use a walker or a cane, use it at all times. Be sure to keep your therapy appointments. °· Follow all instructions for follow-up with your health care provider. This is very important. This includes any referrals, physical therapy, rehabilitation, and lab tests. Proper follow-up can prevent another stroke from occurring. °SEEK MEDICAL CARE IF: °· You have personality changes. °· You have difficulty swallowing. °· You are seeing double. °· You have dizziness. °· You have a fever. °· You have skin breakdown. °SEEK IMMEDIATE MEDICAL CARE IF:  °Any of these symptoms may represent a serious problem that is an emergency. Do not wait to see if the symptoms will go away. Get medical help right away. Call your local emergency services (911 in [place] Do not drive yourself to the hospital. °· You have sudden weakness or numbness of the face, arm, or leg, especially on one side of the body. °· You have sudden trouble walking or difficulty moving arms or legs. °· You have sudden confusion. °· You have trouble speaking (aphasia) or understanding. °· You have sudden trouble seeing in one or both eyes. °· You have a loss of  balance or coordination. °· You have a sudden, severe headache with no known cause. °· You have new chest pain or an irregular heartbeat. °· You have a partial or total loss of consciousness. °Document Released: 09/06/2005 Document Revised: 01/21/2014 Document Reviewed: 04/16/2012 °[employer]® Patient Information ©2015 [employer]. This information is not intended to replace advice given to you by your health care provider. Make sure you discuss any questions you have with your health care provider. ° °

## 2015-06-06 NOTE — Therapy (Signed)
[facility] [address] [place], [place], [address] Phone: [phone]   Fax:  [phone]  Physical Therapy Treatment  Patient Details  Name: Trevor Wheeler MRN: [id number] Date of Birth: Feb 24, 1946 Referring Provider:  Albertine Patricia, MD  Encounter Date: 06/06/2015      PT End of Session - 06/06/15 1413    Visit Number 5   Number of Visits 9   Date for PT Re-Evaluation 06/22/15   Authorization Type Pt does not have Medicare, but is applying for Medicare, so PT will put in a G-code.   PT Start Time 1016   PT Stop Time 1058   PT Time Calculation (min) 42 min   Equipment Utilized During Treatment Gait belt   Activity Tolerance Treatment limited secondary to medical complications (Comment)  increased BP   Behavior During Therapy WFL for tasks assessed/performed      Past Medical History  Diagnosis Date  . Hypertension   . Type II diabetes mellitus dx'd 05/05/2015  . CVA (cerebral vascular accident) 05/04/2015    L cerebellar, "only problem I have is w/balance" 05/05/2015)    Past Surgical History  Procedure Laterality Date  . No past surgeries    . Ep implantable device N/A 05/07/2015    Procedure: Loop Recorder Insertion;  Surgeon: Sanda Klein, MD;  Location: [facility];  Service: Cardiovascular;  Laterality: N/A;  . Tee without cardioversion N/A 05/07/2015    Procedure: TRANSESOPHAGEAL ECHOCARDIOGRAM (TEE);  Surgeon: Skeet Latch, MD;  Location: [facility];  Service: Cardiovascular;  Laterality: N/A;    Filed Vitals:   06/06/15 1026 06/06/15 1052 06/06/15 1056  BP: 144/77 171/80 153/87  Pulse: 85 84 90    Visit Diagnosis:  Abnormality of gait  Unsteadiness      Subjective Assessment - 06/06/15 1019    Subjective Pt denied falls since last visit. Pt reported he got dizzy/lightheaded yesterday for about 10 minutes, after climbing steps. He was getting home from fishing and reported  he drank water while fishing. Pt reported dizziness ceased after sitting down for about 10 minutes.   Pertinent History DM (recently diagnosed)   Patient Stated Goals Get rid of lightheadedness, improve endurance and balance   Currently in Pain? No/denies                         [facility] Adult PT Treatment/Exercise - 06/06/15 1031    Ambulation/Gait   Ambulation/Gait Yes   Ambulation/Gait Assistance 5: Supervision   Ambulation/Gait Assistance Details Amb. while carrying 30# crate, cues for upright posture.   Ambulation Distance (Feet) 115 Feet  30'x2   Assistive device None   Gait Pattern Step-through pattern;Wide base of support;Decreased trunk rotation   Ambulation Surface Level;Indoor   Stairs Yes   Stairs Assistance 5: Supervision   Stairs Assistance Details (indicate cue type and reason) pt carried 30# crate to mimic work activities. Cues to improve wt. shifting.   Stair Management Technique No rails;Alternating pattern   Number of Stairs 12   Height of Stairs 6   High Level Balance   High Level Balance Activities Other (comment)   High Level Balance Comments Pt performed mini squat while shuffling to the side and tossing/catching a 2# ball, with min guard to min A. Cues to maintain squatting position. Cues for technique.             Balance Exercises - 06/06/15 1411    Balance Exercises:  Standing   Other Standing Exercises In // bars, no UE support, with min guard to min A: pt stood on Bosu ball and performed head turns with eyes open/closed, static standing with eyes closed, and tossed/caught 2 and 5 # ball. 10-30second holds and x25 reps of ball toss. Cues for technique and to maintain COG in middle vs. lateral weight shifting.           PT Education - 06/06/15 1026    Education provided Yes   Education Details CVA education   Person(s) Educated Patient   Methods Explanation   Comprehension Verbalized understanding          PT Short Term  Goals - 05/23/15 1431    PT SHORT TERM GOAL #1   Title same as LTGs.           PT Long Term Goals - 05/30/15 1511    PT LONG TERM GOAL #1   Title Pt will verbalize understanding of CVA risk factors and signs/symptoms to decrease risk of future CVA. Target date: 06/20/15   Status On-going   PT LONG TERM GOAL #2   Title Pt will improve FGA score to 25/30 to decrease falls risk. Target date: 06/20/15.   Status On-going   PT LONG TERM GOAL #3   Title Pt will traverse 8 steps ind without UE support to perform work tasks and climb steps at home. Target date: 06/20/15.   Status On-going   PT LONG TERM GOAL #4   Title Pt will be able to carry 50 pounds 100', ind, without LOB in order to perform work duties ind. Target date: 06/20/15.   Status On-going   PT LONG TERM GOAL #5   Title Pt will be able to amb. 1000' over even/uneven terrain without LOB, ind, to improve functional mobility. Target date: 06/20/15.   Status On-going   PT LONG TERM GOAL #6   Title Obtain pt's foto score and write goal. Target date:06/20/15.   Status On-going               Plan - 06/06/15 1414    Clinical Impression Statement Pt limited by increase BP after balance activities (171/80) and required a seated 5 minute rest break at end of session for systolic BP to decrease 625/63, but diastolic was elevated. PT encouraged pt to inform MD of eleveated BP and PT will send note to neurologist. Pt demonstrated progress in balance, as he was able to perform high level balance activities with min guard to min A. Continue with POC.   Pt will benefit from skilled therapeutic intervention in order to improve on the following deficits Abnormal gait;Decreased mobility;Dizziness;Decreased balance;Decreased knowledge of use of DME   Rehab Potential Good   PT Frequency 2x / week   PT Duration 4 weeks   PT Treatment/Interventions ADLs/Self Care Home Management;Neuromuscular re-education;Biofeedback;Canalith Repostioning;Gait  training;Stair training;Functional mobility training;Therapeutic activities;Therapeutic exercise;Balance training;Manual techniques;Vestibular;Orthotic Fit/Training;Patient/family education;DME Instruction   PT Next Visit Plan Dynamic gait/balance activities, monitor BP.   PT Home Exercise Plan balance HEP   Consulted and Agree with Plan of Care Patient        Problem List Patient Active Problem List   Diagnosis Date Noted  . Cryptogenic stroke   . Type II diabetes mellitus, uncontrolled 05/06/2015  . Dyslipidemia 05/06/2015  . Stroke   . CVA (cerebral infarction) 05/05/2015  . Nausea & vomiting 05/05/2015  . Balance disorder 05/05/2015  . 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 Acute CVA (cerebrovascular accident)   .  HYPERLIPIDEMIA 08/12/2010    Notnamed Scholz L 06/06/2015, 2:17 PM  [facility] [address] [place], [place], [address] Phone: [phone]   Fax:  [phone]     Geoffry Paradise, PT,DPT 06/06/2015 2:17 PM Phone: [phone] Fax: [phone]

## 2015-06-07 LAB — CUP PACEART REMOTE DEVICE CHECK: MDC IDC SESS DTM: 20160917144851

## 2015-06-09 ENCOUNTER — Other Ambulatory Visit: Payer: Self-pay | Admitting: Neurology

## 2015-06-09 ENCOUNTER — Ambulatory Visit (INDEPENDENT_AMBULATORY_CARE_PROVIDER_SITE_OTHER): Payer: Self-pay | Admitting: Neurology

## 2015-06-09 ENCOUNTER — Telehealth: Payer: Self-pay

## 2015-06-09 DIAGNOSIS — E1159 Type 2 diabetes mellitus with other circulatory complications: Secondary | ICD-10-CM

## 2015-06-09 DIAGNOSIS — E669 Obesity, unspecified: Secondary | ICD-10-CM

## 2015-06-09 DIAGNOSIS — I63112 Cerebral infarction due to embolism of left vertebral artery: Secondary | ICD-10-CM

## 2015-06-09 DIAGNOSIS — E785 Hyperlipidemia, unspecified: Secondary | ICD-10-CM

## 2015-06-09 DIAGNOSIS — I63119 Cerebral infarction due to embolism of unspecified vertebral artery: Secondary | ICD-10-CM

## 2015-06-09 MED ORDER — ATORVASTATIN CALCIUM 20 MG PO TABS: 20.0000 mg | ORAL_TABLET | Freq: Every day | ORAL | Status: DC

## 2015-06-09 NOTE — Telephone Encounter (Signed)
Patient called to cancel 06/11/15 appt and inquired about FMLA form. Please call patient.

## 2015-06-09 NOTE — Telephone Encounter (Signed)
Rn call patient about scheduling husband for a follow up appt with Dr.Xu. Pt was seen today this am by research. Pt is currently receiving outpatient therapy for his stroke. Rn talk to [facility] patients wife about scheduling a follow up.  Rn explain to pts wife that her husband brought in a form for FMLA for his job. The form can only be filled out once seen by md for a follow up visit for hospital. Rn schedule pts husband for 06-11-15 at 0830.  Appt was confirm with pts wife.

## 2015-06-10 ENCOUNTER — Telehealth: Payer: Self-pay | Admitting: *Deleted

## 2015-06-10 ENCOUNTER — Encounter: Payer: Self-pay | Admitting: Neurology

## 2015-06-10 DIAGNOSIS — E785 Hyperlipidemia, unspecified: Secondary | ICD-10-CM | POA: Insufficient documentation

## 2015-06-10 DIAGNOSIS — E668 Other obesity: Secondary | ICD-10-CM | POA: Insufficient documentation

## 2015-06-10 DIAGNOSIS — I63112 Cerebral infarction due to embolism of left vertebral artery: Secondary | ICD-10-CM | POA: Insufficient documentation

## 2015-06-10 DIAGNOSIS — E1159 Type 2 diabetes mellitus with other circulatory complications: Secondary | ICD-10-CM | POA: Insufficient documentation

## 2015-06-10 NOTE — Progress Notes (Signed)
Pt came in for enrollment into [employer] trial. He read ICF carefully and agreed to participate. General and neuro exam were normal. Refilled lipitor. Completed paper work. Pt has no complains and his questions answered.   Rosalin Hawking, MD PhD Stroke Neurology 06/10/2015 5:24 AM

## 2015-06-10 NOTE — Telephone Encounter (Signed)
Rn call patient about FMLA forms. RN explain that the FMLA form was completed by Dr.XU. Patient stated he has therapy next door in Neuro rehab tomorrow at 0930am. Nurse stated a copy of the form will be in at the front desk in a envelope. Pt stated he will pick up tomorrow. Pt cancel appt tomorrow with Dr.Xu because he saw him in research study on 06-09-15.

## 2015-06-10 NOTE — Telephone Encounter (Signed)
[employer] FMLA received,completed by Dr Erlinda Hong and Katrina at front desk for patient 06/10/15.

## 2015-06-10 NOTE — Progress Notes (Signed)
Loop recorder 

## 2015-06-11 ENCOUNTER — Ambulatory Visit: Payer: BC Managed Care – PPO | Admitting: Neurology

## 2015-06-11 ENCOUNTER — Ambulatory Visit: Payer: Self-pay | Admitting: Neurology

## 2015-06-11 ENCOUNTER — Encounter: Payer: BC Managed Care – PPO | Admitting: Physical Therapy

## 2015-06-11 ENCOUNTER — Ambulatory Visit: Payer: BC Managed Care – PPO | Admitting: Physical Therapy

## 2015-06-11 VITALS — BP 146/96 | HR 85

## 2015-06-11 DIAGNOSIS — R269 Unspecified abnormalities of gait and mobility: Secondary | ICD-10-CM | POA: Diagnosis not present

## 2015-06-11 DIAGNOSIS — R2681 Unsteadiness on feet: Secondary | ICD-10-CM

## 2015-06-11 NOTE — Therapy (Signed)
[facility] [address] [place], [place], [address] Phone: [phone]   Fax:  [phone]  Physical Therapy Treatment  Patient Details  Name: Trevor Wheeler MRN: [id number] Date of Birth: December 06, 1945 Referring Provider:  Albertine Patricia, MD  Encounter Date: 06/11/2015      PT End of Session - 06/11/15 1145    Visit Number 6   Number of Visits 9   Date for PT Re-Evaluation 06/22/15   Authorization Type Pt does not have Medicare, but is applying for Medicare, so PT will put in a G-code.   PT Start Time [phone]   PT Stop Time 1015   PT Time Calculation (min) 41 min   Activity Tolerance Treatment limited secondary to medical complications (Comment)  increased BP with activity      Past Medical History  Diagnosis Date  . Hypertension   . Type II diabetes mellitus dx'd 05/05/2015  . CVA (cerebral vascular accident) 05/04/2015    L cerebellar, "only problem I have is w/balance" 05/05/2015)    Past Surgical History  Procedure Laterality Date  . No past surgeries    . Ep implantable device N/A 05/07/2015    Procedure: Loop Recorder Insertion;  Surgeon: Sanda Klein, MD;  Location: [facility];  Service: Cardiovascular;  Laterality: N/A;  . Tee without cardioversion N/A 05/07/2015    Procedure: TRANSESOPHAGEAL ECHOCARDIOGRAM (TEE);  Surgeon: Skeet Latch, MD;  Location: [facility];  Service: Cardiovascular;  Laterality: N/A;    Filed Vitals:   06/11/15 0900 06/11/15 0956 06/11/15 1002 06/11/15 1011  BP: 150/87 176/101 147/84 146/96  Pulse: 79 88 81 85    Visit Diagnosis:  Abnormality of gait  Unsteadiness      Subjective Assessment - 06/11/15 0936    Subjective Pt denies falls since last visit but did "stumble" on steps when turning fast.     Pertinent History DM (recently diagnosed)   Patient Stated Goals Get rid of lightheadedness, improve endurance and balance   Currently in Pain? No/denies                         [facility] Adult PT Treatment/Exercise - 06/11/15 0001    Transfers   Transfers Sit to Stand;Stand to Sit   Sit to Stand 7: Independent   Stand to Sit 7: Independent   Ambulation/Gait   Ambulation/Gait Yes   Ambulation/Gait Assistance 5: Supervision   Ambulation/Gait Assistance Details carrying 30# crate-wide BOS   Ambulation Distance (Feet) 180 Feet   Assistive device None   Gait Pattern Step-through pattern;Wide base of support;Decreased trunk rotation   Ambulation Surface Level;Indoor   Stairs Yes   Stairs Assistance 5: Supervision   Stairs Assistance Details (indicate cue type and reason) 30# crate with unsteadiness, especially descending steps;decreased weight to 20# with improved steadiness   Stair Management Technique No rails;Alternating pattern   Number of Stairs 16   Height of Stairs 6   High Level Balance   High Level Balance Activities Tandem walking;Head turns;Backward walking;Other (comment)   High Level Balance Comments tandem walking on foam forwards and backwards with min assist for LOB, walking tossing ball and walking while looking at cards held laterally-no LOB with these tasks no c/o dizzines except with initial sit<>stand;standing on foam with head turns/nods with intermittent UE support;standing on both surfaces fo BOSU with head turns/nods and ball toss (better balance on black surface of BOSU)   Knee/Hip Exercises: Machines for Strengthening  Cybex Leg Press 90# x 15 then 110# x 15-bil LE's   Ankle Exercises: Seated   Towel Inversion/Eversion Weights;Other (comment)  10 reps   Towel Inversion/Eversion Weights (lbs) red band                PT Education - 06/11/15 1144    Education provided Yes   Education Details Checking BP before and after walking dog at home;limits of BP   Person(s) Educated Patient   Methods Explanation   Comprehension Verbalized understanding          PT Short Term Goals - 05/23/15  1431    PT SHORT TERM GOAL #1   Title same as LTGs.           PT Long Term Goals - 05/30/15 1511    PT LONG TERM GOAL #1   Title Pt will verbalize understanding of CVA risk factors and signs/symptoms to decrease risk of future CVA. Target date: 06/20/15   Status On-going   PT LONG TERM GOAL #2   Title Pt will improve FGA score to 25/30 to decrease falls risk. Target date: 06/20/15.   Status On-going   PT LONG TERM GOAL #3   Title Pt will traverse 8 steps ind without UE support to perform work tasks and climb steps at home. Target date: 06/20/15.   Status On-going   PT LONG TERM GOAL #4   Title Pt will be able to carry 50 pounds 100', ind, without LOB in order to perform work duties ind. Target date: 06/20/15.   Status On-going   PT LONG TERM GOAL #5   Title Pt will be able to amb. 1000' over even/uneven terrain without LOB, ind, to improve functional mobility. Target date: 06/20/15.   Status On-going   PT LONG TERM GOAL #6   Title Obtain pt's foto score and write goal. Target date:06/20/15.   Status On-going               Plan - 06/11/15 1146    Clinical Impression Statement Pt continues to have increase in BP with activity/exercise.  Requires seated rest breaks of approx. 5 minutes to allow BP to decrease.  Encouraged pt to check BP at home before and after walking dog, as this seems to be most activity performed outside of therapy.  Pt also c/o blurred vision in R eye since stroke and feels like this impacts his balance.  Discussed possibility of opthamogy consult for blurred vision.  Continue PT per POC.   Pt will benefit from skilled therapeutic intervention in order to improve on the following deficits Abnormal gait;Decreased mobility;Dizziness;Decreased balance;Decreased knowledge of use of DME   Rehab Potential Good   PT Frequency 2x / week   PT Duration 4 weeks   PT Treatment/Interventions ADLs/Self Care Home Management;Neuromuscular re-education;Biofeedback;Canalith  Repostioning;Gait training;Stair training;Functional mobility training;Therapeutic activities;Therapeutic exercise;Balance training;Manual techniques;Vestibular;Orthotic Fit/Training;Patient/family education;DME Instruction   PT Next Visit Plan Follow up on BP readings at home;monitor BP during session;climbing ladder to simulate work environment.   Consulted and Agree with Plan of Care Patient        Problem List Patient Active Problem List   Diagnosis Date Noted  . 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 HLD (hyperlipidemia) 06/10/2015  . Obesity 06/10/2015  . Type 2 diabetes mellitus with other circulatory complications 53/29/9242  . Cryptogenic stroke   . Type II diabetes mellitus, uncontrolled 05/06/2015  . Dyslipidemia 05/06/2015  . Stroke   . CVA (  cerebral infarction) 05/05/2015  . Nausea & vomiting 05/05/2015  . Balance disorder 05/05/2015  . 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 Acute CVA (cerebrovascular accident)   . HYPERLIPIDEMIA 08/12/2010    Narda Bonds 06/11/2015, 11:52 AM  [facility] [address] [place], [place], [address] Phone: [phone]   Fax:  [place] 06/11/2015 11:52 AM Phone: [phone] Fax: [phone]

## 2015-06-11 NOTE — Progress Notes (Signed)
Carelink summary report received. Battery status OK. Normal device function. No new symptom episodes, tachy episodes, brady, or pause episodes. No new AF episodes, +ASA 325mg . Monthly summary reports and ROV with [facility] on 08/06/15 at 9:30am.

## 2015-06-12 ENCOUNTER — Ambulatory Visit: Payer: BC Managed Care – PPO

## 2015-06-12 VITALS — BP 151/97 | HR 81

## 2015-06-12 DIAGNOSIS — R2681 Unsteadiness on feet: Secondary | ICD-10-CM

## 2015-06-12 DIAGNOSIS — R269 Unspecified abnormalities of gait and mobility: Secondary | ICD-10-CM | POA: Diagnosis not present

## 2015-06-12 NOTE — Patient Instructions (Addendum)
Perform in corner with chair in front of you for safety OR at kitchen sink with chair behind you:  Balance: Eyes Closed - Bilateral (Varied Surfaces)   Stand, feet shoulder width, close eyes. Maintain balance 20 seconds. Repeat 2 times per set. Do 2 times a day. Repeat on compliant surface: foam.  Copyright  VHI. All rights reserved.  Balance: Neck Rotation, Eyes Closed - Bilateral (Varied Surfaces)   Stand, feet shoulder width, close eyes. Balance and turn head and neck from side to side. Repeat 10 times per set. Repeat moving head up and down. Do 2 sets per day.  Repeat on compliant surface: foam.  Copyright  VHI. All rights reserved.  Feet Together, Head Motion - Eyes Closed   With eyes closed and feet together, move head slowly, up and down. Repeat 10 times per session. Repeat moving head side to side. Do 2 sessions per day.  Copyright  VHI. All rights reserved.  Feet Together (Compliant Surface) Head Motion - Eyes Closed   Stand on compliant surface: on pillow with feet together. Close eyes and move head slowly, up and down. Repeat 10 times per session. Repeat moving head side to side. Do 2 sessions per day.  Copyright  VHI. All rights reserved.   Braiding   Move to side: 1) cross right leg in front of left, 2) bring back leg out to side, then 3) cross right leg behind left, 4) bring left leg out to side. Continue sequence in same direction. Reverse sequence, moving in opposite direction. Repeat sequence 4 times in front of counter. Do 2 sessions per day.  Copyright  VHI. All rights reserved.  SINGLE LIMB STANCE   Stance: single leg on floor. Raise leg. Hold 30 seconds. Repeat with other leg. Make sure your hips stay level. 3 reps per set, 2 sets per day.  Copyright  VHI. All rights reserved.  Tandem Stance   Right foot in front of left, heel touching toe both feet "straight ahead". Stand on Foot Triangle of Support with both feet. Balance in this  position 30 seconds. Do with left foot in front of right.  Copyright  VHI. All rights reserved.  Feet Heel-Toe "Tandem"   Arms by side holding counter if needed, walk a straight line bringing one foot directly in front of the other. Do length of counter x 4. Do 2 sessions per day.  Copyright  VHI. All rights reserved.   Walking Head Turn (side to side and up and down)   Standing close to a counter, close eyes, walk __10__ feet while turning head to the left for 2 steps and then to the right for 2 steps. Touch counter/wall if necessary to keep balance. Repeat _4___ times. Do __1__ sessions per day. REPEAT while turning head up for 2 steps and down for 2 steps, 4 times, once a day.  http://[email or web address].exer.us/535   Copyright  VHI. All rights reserved.

## 2015-06-12 NOTE — Therapy (Signed)
[facility] [address] [place], [place], [address] Phone: [phone]   Fax:  [phone]  Physical Therapy Treatment  Patient Details  Name: Trevor Wheeler MRN: [id number] Date of Birth: July 16, 1946 Referring Provider:  Albertine Patricia, MD  Encounter Date: 06/12/2015      PT End of Session - 06/12/15 1942    Visit Number 7   Number of Visits 9   Date for PT Re-Evaluation 06/22/15   Authorization Type Pt does not have Medicare, but is applying for Medicare, so PT will put in a G-code.   PT Start Time [phone]   PT Stop Time 0929   PT Time Calculation (min) 38 min   Equipment Utilized During Treatment Gait belt   Activity Tolerance Treatment limited secondary to medical complications (Comment)  pt's BP increased at end of session   Behavior During Therapy [facility] for tasks assessed/performed      Past Medical History  Diagnosis Date  . Hypertension   . Type II diabetes mellitus dx'd 05/05/2015  . CVA (cerebral vascular accident) 05/04/2015    L cerebellar, "only problem I have is w/balance" 05/05/2015)    Past Surgical History  Procedure Laterality Date  . No past surgeries    . Ep implantable device N/A 05/07/2015    Procedure: Loop Recorder Insertion;  Surgeon: Sanda Klein, MD;  Location: [facility];  Service: Cardiovascular;  Laterality: N/A;  . Tee without cardioversion N/A 05/07/2015    Procedure: TRANSESOPHAGEAL ECHOCARDIOGRAM (TEE);  Surgeon: Skeet Latch, MD;  Location: [facility] ENDOSCOPY;  Service: Cardiovascular;  Laterality: N/A;    Filed Vitals:   06/12/15 0856 06/12/15 0915  BP: 150/89 151/97  Pulse: 81 81    Visit Diagnosis:  Abnormality of gait  Unsteadiness      Subjective Assessment - 06/12/15 0854    Subjective Pt denied falls or changes sinces last visit. Pt reported he felt a little lightheaded when turning too fast going up the stairs on Tuesday morning.    Pertinent History  HTN, DM (recently diagnosed)   Patient Stated Goals Get rid of lightheadedness, improve endurance and balance   Currently in Pain? No/denies        Neuro re-ed: Pt performed, reviewed, and progressed balance HEP. Cues for technique and performed with min guard to S for safety. Please see pt instructions for details.  Pt climbed 8' ladder x3 with cues to pick a focal point in order to decr. Dizziness. Performed with min guard to S, no LOB and pt demonstrated good weight shifting to traverse ladder steps.                       PT Education - 06/12/15 1940    Education provided Yes   Education Details PT discussed the importance of continuing to check BP at home before and after activity, as pt's BP was elevated again today. Pt reported he informed Dr. Erlinda Hong, and that he has another appt. with Dr. Erlinda Hong next week. He reports MD did not give pt specific instructions/parameters regarding BP. PT encouraged pt to inform MD that his BP continues to be elevated after activity and feels symptoms of dizziness during certain activities at home.   Person(s) Educated Patient   Methods Explanation   Comprehension Verbalized understanding          PT Short Term Goals - 05/23/15 1431    PT SHORT TERM GOAL #1   Title same as  LTGs.           PT Long Term Goals - 05/30/15 1511    PT LONG TERM GOAL #1   Title Pt will verbalize understanding of CVA risk factors and signs/symptoms to decrease risk of future CVA. Target date: 06/20/15   Status On-going   PT LONG TERM GOAL #2   Title Pt will improve FGA score to 25/30 to decrease falls risk. Target date: 06/20/15.   Status On-going   PT LONG TERM GOAL #3   Title Pt will traverse 8 steps ind without UE support to perform work tasks and climb steps at home. Target date: 06/20/15.   Status On-going   PT LONG TERM GOAL #4   Title Pt will be able to carry 50 pounds 100', ind, without LOB in order to perform work duties ind. Target date:  06/20/15.   Status On-going   PT LONG TERM GOAL #5   Title Pt will be able to amb. 1000' over even/uneven terrain without LOB, ind, to improve functional mobility. Target date: 06/20/15.   Status On-going   PT LONG TERM GOAL #6   Title Obtain pt's foto score and write goal. Target date:06/20/15.   Status On-going               Plan - 06/12/15 1943    Clinical Impression Statement Pt's BP continues to increase during activity, pt reports MD is aware. PT again encouraged pt to take BP at home prior to and after exercise and when pt feels dizzy. Pt demonstrated progress, as he was able to progress balance HEP. Continue with POC.   Pt will benefit from skilled therapeutic intervention in order to improve on the following deficits Abnormal gait;Decreased mobility;Dizziness;Decreased balance;Decreased knowledge of use of DME   Rehab Potential Good   PT Frequency 2x / week   PT Duration 4 weeks   PT Treatment/Interventions ADLs/Self Care Home Management;Neuromuscular re-education;Biofeedback;Canalith Repostioning;Gait training;Stair training;Functional mobility training;Therapeutic activities;Therapeutic exercise;Balance training;Manual techniques;Vestibular;Orthotic Fit/Training;Patient/family education;DME Instruction   PT Next Visit Plan Begin to assess LTGs and assess BP.   PT Home Exercise Plan balance HEP   Consulted and Agree with Plan of Care Patient        Problem List Patient Active Problem List   Diagnosis Date Noted  . 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 HLD (hyperlipidemia) 06/10/2015  . Obesity 06/10/2015  . Type 2 diabetes mellitus with other circulatory complications 71/24/5809  . Cryptogenic stroke   . Type II diabetes mellitus, uncontrolled 05/06/2015  . Dyslipidemia 05/06/2015  . Stroke   . CVA (cerebral infarction) 05/05/2015  . Nausea & vomiting 05/05/2015  . Balance disorder 05/05/2015  . 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 Acute CVA  (cerebrovascular accident)   . HYPERLIPIDEMIA 08/12/2010    Miller,Jennifer L 06/12/2015, 7:45 PM  [facility] [address] [place], [place], [address] Phone: [phone]   Fax:  [phone]     Geoffry Paradise, PT,DPT 06/12/2015 7:45 PM Phone: [phone] Fax: [phone]

## 2015-06-17 ENCOUNTER — Ambulatory Visit (INDEPENDENT_AMBULATORY_CARE_PROVIDER_SITE_OTHER): Payer: Self-pay | Admitting: Neurology

## 2015-06-17 DIAGNOSIS — I639 Cerebral infarction, unspecified: Secondary | ICD-10-CM

## 2015-06-17 NOTE — Progress Notes (Signed)
RESPECT ESUS STUDY Randomization VISIT  Patient was seen today for the study randomization visit. He presented with cryptogenic left cerebellar infarct in August 2016. Patient's hospital chart, imaging studies, echocardiogram, Doppler studies, cardiac monitoring, lab work were all reviewed. Patient met all inclusion and exclusion criteria. He was given opportunity to answer questions which were answered.  He will be randomized into the study as per protocol  Antony Contras, MD

## 2015-06-18 ENCOUNTER — Ambulatory Visit: Payer: BC Managed Care – PPO | Admitting: Physical Therapy

## 2015-06-18 VITALS — BP 186/93 | HR 100

## 2015-06-18 DIAGNOSIS — R269 Unspecified abnormalities of gait and mobility: Secondary | ICD-10-CM

## 2015-06-19 ENCOUNTER — Ambulatory Visit (INDEPENDENT_AMBULATORY_CARE_PROVIDER_SITE_OTHER): Payer: BC Managed Care – PPO | Admitting: Adult Health

## 2015-06-19 ENCOUNTER — Encounter: Payer: Self-pay | Admitting: Adult Health

## 2015-06-19 VITALS — BP 146/90 | HR 89 | Temp 99.0°F | Ht 71.0 in | Wt 237.7 lb

## 2015-06-19 DIAGNOSIS — E1165 Type 2 diabetes mellitus with hyperglycemia: Secondary | ICD-10-CM | POA: Diagnosis not present

## 2015-06-19 DIAGNOSIS — Z23 Encounter for immunization: Secondary | ICD-10-CM

## 2015-06-19 DIAGNOSIS — Z7189 Other specified counseling: Secondary | ICD-10-CM | POA: Diagnosis not present

## 2015-06-19 DIAGNOSIS — Z7689 Persons encountering health services in other specified circumstances: Secondary | ICD-10-CM

## 2015-06-19 DIAGNOSIS — I10 Essential (primary) hypertension: Secondary | ICD-10-CM | POA: Insufficient documentation

## 2015-06-19 DIAGNOSIS — IMO0002 Reserved for concepts with insufficient information to code with codable children: Secondary | ICD-10-CM

## 2015-06-19 MED ORDER — GLIPIZIDE 5 MG PO TABS: 5.0000 mg | ORAL_TABLET | Freq: Two times a day (BID) | ORAL | Status: DC

## 2015-06-19 NOTE — Patient Instructions (Signed)
It was great meeting you today!  Someone will call you to schedule a colonoscopy  Increase your glipizide to 5 mg twice a day.   Follow up for a physical

## 2015-06-19 NOTE — Progress Notes (Signed)
Pre visit review using our clinic review tool, if applicable. No additional management support is needed unless otherwise documented below in the visit note. 

## 2015-06-19 NOTE — Progress Notes (Signed)
HPI:  Trevor Wheeler is here to establish care. He is a pleasant AA male who  has a past medical history of Hypertension; Type II diabetes mellitus (dx'd 05/05/2015); and CVA (cerebral vascular accident) (05/04/2015).  Last PCP and physical: 2011 Immunizations: UTD Diet: Eating healthy since his stroke in August 2016 Exercise:Walks three miles a day.  Colonoscopy:2009   Has the following chronic problems that require follow up and concerns today:   S/p Acute left cerebellar CVA - 05/08/2015 - Has residual blurred vision in right eye and gait disturbance. He is participating in PT and is currently in a clinical trial. Followed by neurology   Hypertension  - He is not currently on any blood pressure medications. Per patient, Neurology is monitoring his blood pressure. Home logs show many blood pressures in the 798-921 systolic range.    Diabetes He is currently taking Glipizide 2.5 mg. His blood sugars have been a li elevated at home. His log shows blood suagrs betwwen 90 - 199. Last A1c is 8.1. He is working on eating a diabetic diet.     ROS negative for unless reported above: fevers, chills,feeling poorly, unintentional weight loss, hearing or vision loss, chest pain, palpitations, leg claudication, struggling to breath,Not feeling congested in the chest, no orthopenia, no cough,no wheezing, normal appetite, no soft tissue swelling, no hemoptysis, melena, hematochezia, hematuria, falls, loc, si, or thoughts of self harm.   Past Medical History  Diagnosis Date  . Hypertension   . Type II diabetes mellitus dx'd 05/05/2015  . CVA (cerebral vascular accident) 05/04/2015    L cerebellar, "only problem I have is w/balance" 05/05/2015)    Past Surgical History  Procedure Laterality Date  . No past surgeries    . Ep implantable device N/A 05/07/2015    Procedure: Loop Recorder Insertion;  Surgeon: Sanda Klein, MD;  Location: [facility];  Service: Cardiovascular;  Laterality:  N/A;  . Tee without cardioversion N/A 05/07/2015    Procedure: TRANSESOPHAGEAL ECHOCARDIOGRAM (TEE);  Surgeon: Skeet Latch, MD;  Location: [facility] ENDOSCOPY;  Service: Cardiovascular;  Laterality: N/A;    Family History  Problem Relation Age of Onset  . Heart disease Mother   . Cancer Brother   . Heart disease Brother     Social History   Social History  . Marital Status: Married    Spouse Name: N/A  . Number of Children: N/A  . Years of Education: N/A   Social History Main Topics  . Smoking status: Former Smoker -- 1.00 packs/day for 20 years    Types: Cigarettes    Quit date: 05/21/1974  . Smokeless tobacco: Never Used     Comment: quit smoking in the 1970's  . Alcohol Use: No  . Drug Use: No  . Sexual Activity: Not Currently   Other Topics Concern  . None   Social History Narrative     Current outpatient prescriptions:  .  atorvastatin (LIPITOR) 20 MG tablet, Take 1 tablet (20 mg total) by mouth daily at 6 PM., Disp: 90 tablet, Rfl: 3 .  glipiZIDE (GLUCOTROL) 5 MG tablet, Take 0.5 tablets (2.5 mg total) by mouth daily before breakfast., Disp: 30 tablet, Rfl: 0  EXAM:  Filed Vitals:   06/19/15 1358  BP: 146/90  Pulse: 89  Temp: 99 F (37.2 C)    Body mass index is 33.17 kg/(m^2).  GENERAL: vitals reviewed and listed above, alert, oriented, appears well hydrated and in no acute distress  HEENT: atraumatic, conjunttiva clear,  no obvious abnormalities on inspection of external nose and ears  NECK: Neck is soft and supple without masses, no adenopathy or thyromegaly, trachea midline, no JVD. Normal range of motion.   LUNGS: clear to auscultation bilaterally, no wheezes, rales or rhonchi, good air movement  CV: Regular rate and rhythm, normal S1/S2, no audible murmurs, gallops, or rubs. No carotid bruit and no peripheral edema.   MS: moves all extremities without noticeable abnormality. No edema noted  Abd: soft/nontender/nondistended/normal bowel sounds    Skin: warm and dry, no rash   Extremities: No clubbing, cyanosis, or edema. Capillary refill is WNL. Pulses intact bilaterally in upper and lower extremities.   Neuro: CN II-XII intact, sensation and reflexes normal throughout, 5/5 muscle strength in bilateral upper and lower extremities. Normal finger to nose. Normal rapid alternating movements.  PSYCH: pleasant and cooperative, no obvious depression or anxiety  ASSESSMENT AND PLAN:  1. Encounter to establish care - Follow up at next available CPE slot - Follow up sooner if needed - Ambulatory referral to Gastroenterology  2. Encounter for immunization - High dose Flu vaccination given   3. Type II diabetes mellitus, uncontrolled - glipiZIDE (GLUCOTROL) 5 MG tablet; Take 1 tablet (5 mg total) by mouth 2 (two) times daily before a meal.  Dispense: 60 tablet; Refill: 3 - Consider Metformin at next A1c check  4. Essential hypertension - Message sent to Dr. Erlinda Hong about managing BP - Consider placing on Lisinopril.   -We reviewed the PMH, PSH, FH, SH, Meds and Allergies. -We provided refills for any medications we will prescribe as needed. -We addressed current concerns per orders and patient instructions. -We have asked for records for pertinent exams, studies, vaccines and notes from previous providers. -We have advised patient to follow up per instructions below.   -Patient advised to return or notify a provider immediately if symptoms worsen or persist or new concerns arise.   Dorothyann Peng, AGNP

## 2015-06-19 NOTE — Therapy (Signed)
[facility] [address] [place], [place], [address] Phone: [phone]   Fax:  [phone]  Physical Therapy Treatment  Patient Details  Name: Trevor Wheeler MRN: [id number] Date of Birth: 02-24-1946 Referring Provider:  Albertine Patricia, MD  Encounter Date: 06/18/2015      PT End of Session - 06/19/15 1057    Visit Number 8   Number of Visits 9   Date for PT Re-Evaluation 06/22/15   Authorization Type Pt does not have Medicare, but is applying for Medicare, so PT will put in a G-code.   PT Start Time [phone]   PT Stop Time 0930   PT Time Calculation (min) 43 min   Equipment Utilized During Treatment Gait belt   Activity Tolerance Patient tolerated treatment well   Behavior During Therapy WFL for tasks assessed/performed      Past Medical History  Diagnosis Date  . Hypertension   . Type II diabetes mellitus dx'd 05/05/2015  . CVA (cerebral vascular accident) 05/04/2015    L cerebellar, "only problem I have is w/balance" 05/05/2015)    Past Surgical History  Procedure Laterality Date  . No past surgeries    . Ep implantable device N/A 05/07/2015    Procedure: Loop Recorder Insertion;  Surgeon: Sanda Klein, MD;  Location: [facility];  Service: Cardiovascular;  Laterality: N/A;  . Tee without cardioversion N/A 05/07/2015    Procedure: TRANSESOPHAGEAL ECHOCARDIOGRAM (TEE);  Surgeon: Skeet Latch, MD;  Location: [facility] ENDOSCOPY;  Service: Cardiovascular;  Laterality: N/A;    Filed Vitals:   06/18/15 0859 06/18/15 0900 06/18/15 0926  BP: 149/96 174/94 186/93  Pulse: 84 86 100    Visit Diagnosis:  Abnormality of gait      Subjective Assessment - 06/19/15 1043    Subjective Denies falls or changes since last visit.  Feels like he is ready to wrap up on Friday but still having blurry vision.  BP's have been within safe limits per pt.   Pertinent History HTN, DM (recently diagnosed)   Patient Stated  Goals Get rid of lightheadedness, improve endurance and balance   Currently in Pain? No/denies            [facility] PT Assessment - 06/19/15 1055    Transfers   Transfers Sit to Stand;Stand to Sit   Sit to Stand 7: Independent   Stand to Sit 7: Independent   Ambulation/Gait   Ambulation/Gait Yes   Ambulation/Gait Assistance 7: Independent   Ambulation Distance (Feet) 1000 Feet  plus   Assistive device None   Gait Comments Gait also x 120' carrying 50# crate   Functional Gait  Assessment   Gait assessed  Yes   Gait Level Surface Walks 20 ft in less than 5.5 sec, no assistive devices, good speed, no evidence for imbalance, normal gait pattern, deviates no more than 6 in outside of the 12 in walkway width.   Change in Gait Speed Able to smoothly change walking speed without loss of balance or gait deviation. Deviate no more than 6 in outside of the 12 in walkway width.   Gait with Horizontal Head Turns Performs head turns smoothly with no change in gait. Deviates no more than 6 in outside 12 in walkway width   Gait with Vertical Head Turns Performs head turns with no change in gait. Deviates no more than 6 in outside 12 in walkway width.   Gait and Pivot Turn Pivot turns safely within 3 sec and  stops quickly with no loss of balance.   Step Over Obstacle Is able to step over 2 stacked shoe boxes taped together (9 in total height) without changing gait speed. No evidence of imbalance.   Gait with Narrow Base of Support Is able to ambulate for 10 steps heel to toe with no staggering.   Gait with Eyes Closed Walks 20 ft, no assistive devices, good speed, no evidence of imbalance, normal gait pattern, deviates no more than 6 in outside 12 in walkway width. Ambulates 20 ft in less than 7 sec.   Ambulating Backwards Walks 20 ft, no assistive devices, good speed, no evidence for imbalance, normal gait   Steps Alternating feet, no rail.   Total Score 30                     [facility] Adult  PT Treatment/Exercise - 06/19/15 1055    Knee/Hip Exercises: Aerobic   Elliptical 2 minutes forward and 1 minute backward level 2.0                PT Education - 06/19/15 1047    Education provided Yes   Education Details Goals met, need to f/u with opthamologist on blurry vision, continue to monitor BP   Person(s) Educated Patient   Methods Explanation   Comprehension Verbalized understanding          PT Short Term Goals - 05/23/15 1431    PT SHORT TERM GOAL #1   Title same as LTGs.           PT Long Term Goals - 06/19/15 1058    PT LONG TERM GOAL #1   Title Pt will verbalize understanding of CVA risk factors and signs/symptoms to decrease risk of future CVA. Target date: 06/20/15   Status Achieved   PT LONG TERM GOAL #2   Title Pt will improve FGA score to 25/30 to decrease falls risk. Target date: 06/20/15.   Status Achieved   PT LONG TERM GOAL #3   Title Pt will traverse 8 steps ind without UE support to perform work tasks and climb steps at home. Target date: 06/20/15.   Status Achieved   PT LONG TERM GOAL #4   Title Pt will be able to carry 50 pounds 100', ind, without LOB in order to perform work duties ind. Target date: 06/20/15.   Status Achieved   PT LONG TERM GOAL #5   Title Pt will be able to amb. 1000' over even/uneven terrain without LOB, ind, to improve functional mobility. Target date: 06/20/15.   Status Achieved   PT LONG TERM GOAL #6   Title Obtain pt's foto score and write goal. Target date:06/20/15.   Status On-going               Plan - 06/19/15 1058    Clinical Impression Statement Pt met goals 1-5.  BP remained stable today during session and with increased activity.  Pt feels prepared for d/c.  Plan to d/c next visit per POC if primary PT agrees.   Pt will benefit from skilled therapeutic intervention in order to improve on the following deficits Abnormal gait;Decreased mobility;Dizziness;Decreased balance;Decreased knowledge of use  of DME   Rehab Potential Good   PT Frequency 2x / week   PT Duration 4 weeks   PT Treatment/Interventions ADLs/Self Care Home Management;Neuromuscular re-education;Biofeedback;Canalith Repostioning;Gait training;Stair training;Functional mobility training;Therapeutic activities;Therapeutic exercise;Balance training;Manual techniques;Vestibular;Orthotic Fit/Training;Patient/family education;DME Instruction   PT Next Visit Plan FOTO, G-code, d/c if  primary PT agrees.   PT Home Exercise Plan balance HEP   Consulted and Agree with Plan of Care Patient        Problem List Patient Active Problem List   Diagnosis Date Noted  . 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 HLD (hyperlipidemia) 06/10/2015  . Obesity 06/10/2015  . Type 2 diabetes mellitus with other circulatory complications 88/32/5498  . Cryptogenic stroke   . Type II diabetes mellitus, uncontrolled 05/06/2015  . Dyslipidemia 05/06/2015  . Stroke   . CVA (cerebral infarction) 05/05/2015  . Nausea & vomiting 05/05/2015  . Balance disorder 05/05/2015  . 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 Acute CVA (cerebrovascular accident)   . HYPERLIPIDEMIA 08/12/2010    Narda Bonds 06/19/2015, 11:03 AM  [facility] [address] [place], [place], [address] Phone: [phone]   Fax:  [place], [place] 06/19/2015 11:03 AM Phone: [phone] Fax: [phone]

## 2015-06-20 ENCOUNTER — Ambulatory Visit: Payer: BC Managed Care – PPO

## 2015-06-20 VITALS — BP 141/81 | HR 86

## 2015-06-20 DIAGNOSIS — R2681 Unsteadiness on feet: Secondary | ICD-10-CM

## 2015-06-20 DIAGNOSIS — R269 Unspecified abnormalities of gait and mobility: Secondary | ICD-10-CM

## 2015-06-20 NOTE — Therapy (Signed)
[facility] [address] [place], [place], [address] Phone: [phone]   Fax:  [phone]  Physical Therapy Treatment  Patient Details  Name: Trevor Wheeler MRN: [id number] Date of Birth: 06/14/1946 Referring Provider:  Albertine Patricia, MD  Encounter Date: 06/20/2015      PT End of Session - 06/20/15 0819    Visit Number 9   Number of Visits 9   Date for PT Re-Evaluation 06/22/15   Authorization Type Pt does not have Medicare, but is applying for Medicare, so PT will put in a G-code.   PT Start Time 0804   PT Stop Time 0816   PT Time Calculation (min) 12 min   Activity Tolerance Patient tolerated treatment well   Behavior During Therapy [facility] for tasks assessed/performed      Past Medical History  Diagnosis Date  . Hypertension   . Type II diabetes mellitus dx'd 05/05/2015  . CVA (cerebral vascular accident) 05/04/2015    L cerebellar, "only problem I have is w/balance" 05/05/2015)    Past Surgical History  Procedure Laterality Date  . No past surgeries    . Ep implantable device N/A 05/07/2015    Procedure: Loop Recorder Insertion;  Surgeon: Sanda Klein, MD;  Location: [place] [facility];  Service: Cardiovascular;  Laterality: N/A;  . Tee without cardioversion N/A 05/07/2015    Procedure: TRANSESOPHAGEAL ECHOCARDIOGRAM (TEE);  Surgeon: Skeet Latch, MD;  Location: [facility] ENDOSCOPY;  Service: Cardiovascular;  Laterality: N/A;    Filed Vitals:   06/20/15 0813  BP: 141/81  Pulse: 86    Visit Diagnosis:  Abnormality of gait  Unsteadiness      Subjective Assessment - 06/20/15 0805    Subjective Pt reported he went to PCP yesterday and MD increased the Glipizide to manage diabetes. PCP stated he will also message Dr. Erlinda Hong regarding elevated BP, as it was elevated at PCP office yesterday.  Pt reported lightheadedness this morning, 2/10.  Pt forgot to tell PCP about blurry vision in R eye but agreed to call  PCP and notify of blurry vision.   Pertinent History HTN, DM (recently diagnosed)   Patient Stated Goals Get rid of lightheadedness, improve endurance and balance   Currently in Pain? No/denies                  Self care:     PT Education - 06/20/15 0816    Education provided Yes   Education Details Reviewed updated balance HEP and encouraged pt to add pillow to tandem and SLS HEP once it becomes easy. Educated pt on reducing balance HEP to 2-3x/week vs. every day in order to maintain gains, once exercises become easy. PT also encoraged pt to f/u with PCP regarding elevated BP and R eye blurry vision. Discussed goal progress and d/c.   Person(s) Educated Patient   Methods Explanation   Comprehension Verbalized understanding          PT Short Term Goals - 05/23/15 1431    PT SHORT TERM GOAL #1   Title same as LTGs.           PT Long Term Goals - 06/20/15 7262    PT LONG TERM GOAL #1   Title Pt will verbalize understanding of CVA risk factors and signs/symptoms to decrease risk of future CVA. Target date: 06/20/15   Status Achieved   PT LONG TERM GOAL #2   Title Pt will improve FGA score to 25/30 to decrease  falls risk. Target date: 07-08-2015.   Status Achieved   PT LONG TERM GOAL #3   Title Pt will traverse 8 steps ind without UE support to perform work tasks and climb steps at home. Target date: 07-08-2015.   Status Achieved   PT LONG TERM GOAL #4   Title Pt will be able to carry 50 pounds 100', ind, without LOB in order to perform work duties ind. Target date: Jul 08, 2015.   Status Achieved   PT LONG TERM GOAL #5   Title Pt will be able to amb. 1000' over even/uneven terrain without LOB, ind, to improve functional mobility. Target date: July 08, 2015.   Status Achieved   PT LONG TERM GOAL #6   Title Obtain pt's foto score and write goal. Target date:2015/07/08.   Status Deferred               Plan - 08-Jul-2015 0819    Clinical Impression Statement Pt met all goals,  FOTO goal deferred. Pt discharging today, please see d/c summary for details.          G-Codes - Jul 08, 2015 0820    Functional Assessment Tool Used FGA: 30/30   Functional Limitation Mobility: Walking and moving around   Mobility: Walking and Moving Around Goal Status [phone]) At least 1 percent but less than 20 percent impaired, limited or restricted   Mobility: Walking and Moving Around Discharge Status [phone]) At least 1 percent but less than 20 percent impaired, limited or restricted      Problem List Patient Active Problem List   Diagnosis Date Noted  . Essential hypertension 06/19/2015  . 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 HLD (hyperlipidemia) 06/10/2015  . Obesity 06/10/2015  . Type 2 diabetes mellitus with other circulatory complications 86/57/8469  . Cryptogenic stroke   . Type II diabetes mellitus, uncontrolled 05/06/2015  . Dyslipidemia 05/06/2015  . Stroke   . CVA (cerebral infarction) 05/05/2015  . Nausea & vomiting 05/05/2015  . Balance disorder 05/05/2015  . 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 Acute CVA (cerebrovascular accident)   . HYPERLIPIDEMIA 08/12/2010    Miller,Jennifer L July 08, 2015, 8:20 AM  [facility] [address] [place], [place], [address] Phone: [phone]   Fax:  [phone]  PHYSICAL THERAPY DISCHARGE SUMMARY  Visits from Start of Care: 9  Current functional level related to goals / functional outcomes:     PT Long Term Goals - 07/08/2015 0819    PT LONG TERM GOAL #1   Title Pt will verbalize understanding of CVA risk factors and signs/symptoms to decrease risk of future CVA. Target date: 2015/07/08   Status Achieved   PT LONG TERM GOAL #2   Title Pt will improve FGA score to 25/30 to decrease falls risk. Target date: 2015/07/08.   Status Achieved   PT LONG TERM GOAL #3   Title Pt will traverse 8 steps ind without UE support to perform work tasks and  climb steps at home. Target date: 07-08-2015.   Status Achieved   PT LONG TERM GOAL #4   Title Pt will be able to carry 50 pounds 100', ind, without LOB in order to perform work duties ind. Target date: Jul 08, 2015.   Status Achieved   PT LONG TERM GOAL #5   Title Pt will be able to amb. 1000' over even/uneven terrain without LOB, ind, to improve functional mobility. Target date: Jul 08, 2015.   Status Achieved   PT LONG TERM GOAL #6  Title Obtain pt's foto score and write goal. Target date:06/20/15.   Status Deferred        Remaining deficits: Intermittent Lightheadedness    Education / Equipment: HEP  Plan: Patient agrees to discharge.  Patient goals were met. Patient is being discharged due to meeting the stated rehab goals.  ?????       Geoffry Paradise, PT,DPT 06/20/2015 8:20 AM Phone: [phone] Fax: [phone]

## 2015-06-20 NOTE — Patient Instructions (Signed)
Perform in corner with chair in front of you for safety OR at kitchen sink with chair behind you:  Balance: Eyes Closed - Bilateral (Varied Surfaces)   Stand, feet shoulder width, close eyes. Maintain balance 20 seconds. Repeat 2 times per set. Do 2 times a day. Repeat on compliant surface: foam.  Copyright  VHI. All rights reserved.  Balance: Neck Rotation, Eyes Closed - Bilateral (Varied Surfaces)   Stand, feet shoulder width, close eyes. Balance and turn head and neck from side to side. Repeat 10 times per set. Repeat moving head up and down. Do 2 sets per day.  Repeat on compliant surface: foam.  Copyright  VHI. All rights reserved.  Feet Together, Head Motion - Eyes Closed   With eyes closed and feet together, move head slowly, up and down. Repeat 10 times per session. Repeat moving head side to side. Do 2 sessions per day.  Copyright  VHI. All rights reserved.  Feet Together (Compliant Surface) Head Motion - Eyes Closed   Stand on compliant surface: on pillow with feet together. Close eyes and move head slowly, up and down. Repeat 10 times per session. Repeat moving head side to side. Do 2 sessions per day.  Copyright  VHI. All rights reserved.   Braiding   Move to side: 1) cross right leg in front of left, 2) bring back leg out to side, then 3) cross right leg behind left, 4) bring left leg out to side. Continue sequence in same direction. Reverse sequence, moving in opposite direction. Repeat sequence 4 times in front of counter. Do 2 sessions per day.  Copyright  VHI. All rights reserved.  SINGLE LIMB STANCE   Stance: single leg on floor. Raise leg. Hold 30 seconds. Repeat with other leg. Make sure your hips stay level. 3 reps per set, 2 sets per day.  Copyright  VHI. All rights reserved.  Tandem Stance   Right foot in front of left, heel touching toe both feet "straight ahead". Stand on Foot Triangle of Support with both feet. Balance in this  position 30 seconds. Do with left foot in front of right.  Copyright  VHI. All rights reserved.  Feet Heel-Toe "Tandem"   Arms by side holding counter if needed, walk a straight line bringing one foot directly in front of the other. Do length of counter x 4. Do 2 sessions per day.  Copyright  VHI. All rights reserved.   Walking Head Turn (side to side and up and down)   Standing close to a counter, close eyes, walk __10__ feet while turning head to the left for 2 steps and then to the right for 2 steps. Touch counter/wall if necessary to keep balance. Repeat _4___ times. Do __1__ sessions per day. REPEAT while turning head up for 2 steps and down for 2 steps, 4 times, once a day.  http://gt2.exer.us/535   Copyright  VHI. All rights reserved.

## 2015-06-23 ENCOUNTER — Other Ambulatory Visit: Payer: Self-pay | Admitting: Adult Health

## 2015-06-23 MED ORDER — LISINOPRIL 10 MG PO TABS: 10.0000 mg | ORAL_TABLET | Freq: Every day | ORAL | Status: DC

## 2015-06-23 NOTE — Progress Notes (Signed)
Spoke with pt and pt is aware.  Pt states he picked up his blood pressure medication and will monitor his blood pressures.

## 2015-06-24 ENCOUNTER — Telehealth: Payer: Self-pay

## 2015-06-24 ENCOUNTER — Encounter: Payer: Self-pay | Admitting: Cardiovascular Disease

## 2015-06-24 NOTE — Telephone Encounter (Signed)
Lets hold off on the colonoscopy for now

## 2015-06-24 NOTE — Telephone Encounter (Signed)
Pt walked into the office today and states he was contacted to set up his colonoscopy but he is in a clinical trial and those medications needed to be added.  Per pt the clinical trial started last week and he does not know if he has the actual medication or the placebo.  Pt states the trial can last up to 3 years. Pt provided the 2 possible medications and they will be added to his medication list. Please advise on what pt should do about colonoscopy.  Pt also states that he has picked up his Lisnopril 10 mg and started it.  Pt states at noon yesterday his bp was 110 and this morning 120 (top number).  Advised pt to continue to monitor his bp and follow up accordingly.

## 2015-06-24 NOTE — Telephone Encounter (Signed)
Left a message for pt to return call 

## 2015-06-26 NOTE — Telephone Encounter (Signed)
Called and spoke with pt and pt is aware. Advised pt to call office if any concerns arise.  Pt verbalized understanding.

## 2015-06-27 ENCOUNTER — Other Ambulatory Visit (INDEPENDENT_AMBULATORY_CARE_PROVIDER_SITE_OTHER): Payer: BC Managed Care – PPO

## 2015-06-27 DIAGNOSIS — Z0000 Encounter for general adult medical examination without abnormal findings: Secondary | ICD-10-CM

## 2015-06-27 LAB — CBC WITH DIFFERENTIAL/PLATELET
BASOS PCT: 0.5 % (ref 0.0–3.0)
Basophils Absolute: 0.1 10*3/uL (ref 0.0–0.1)
Eosinophils Absolute: 0.1 10*3/uL (ref 0.0–0.7)
Eosinophils Relative: 1.4 % (ref 0.0–5.0)
HEMATOCRIT: 43.5 % (ref 39.0–52.0)
HEMOGLOBIN: 14.1 g/dL (ref 13.0–17.0)
LYMPHS PCT: 34 % (ref 12.0–46.0)
Lymphs Abs: 3.5 10*3/uL (ref 0.7–4.0)
MCHC: 32.4 g/dL (ref 30.0–36.0)
MCV: 83.6 fl (ref 78.0–100.0)
Monocytes Absolute: 0.7 10*3/uL (ref 0.1–1.0)
Monocytes Relative: 6.4 % (ref 3.0–12.0)
NEUTROS ABS: 6 10*3/uL (ref 1.4–7.7)
Neutrophils Relative %: 57.7 % (ref 43.0–77.0)
PLATELETS: 243 10*3/uL (ref 150.0–400.0)
RBC: 5.21 Mil/uL (ref 4.22–5.81)
RDW: 13.8 % (ref 11.5–15.5)
WBC: 10.4 10*3/uL (ref 4.0–10.5)

## 2015-06-27 LAB — LIPID PANEL
CHOL/HDL RATIO: 4
Cholesterol: 118 mg/dL (ref 0–200)
HDL: 30 mg/dL — AB (ref >39.00)
LDL Cholesterol: 67 mg/dL (ref 0–99)
NONHDL: 88.14
TRIGLYCERIDES: 105 mg/dL (ref 0.0–149.0)
VLDL: 21 mg/dL (ref 0.0–40.0)

## 2015-06-27 LAB — BASIC METABOLIC PANEL
BUN: 9 mg/dL (ref 6–23)
CHLORIDE: 105 meq/L (ref 96–112)
CO2: 30 meq/L (ref 19–32)
Calcium: 9.6 mg/dL (ref 8.4–10.5)
Creatinine, Ser: 0.85 mg/dL (ref 0.40–1.50)
GFR: 115.15 mL/min (ref >60.00)
Glucose, Bld: 124 mg/dL — ABNORMAL HIGH (ref 70–99)
POTASSIUM: 4.5 meq/L (ref 3.5–5.1)
SODIUM: 141 meq/L (ref 135–145)

## 2015-06-27 LAB — PSA: PSA: 0.82 ng/mL (ref 0.10–4.00)

## 2015-06-27 LAB — POCT URINALYSIS DIPSTICK
BILIRUBIN UA: NEGATIVE
Glucose, UA: NEGATIVE
Ketones, UA: NEGATIVE
LEUKOCYTES UA: NEGATIVE
NITRITE UA: NEGATIVE
PH UA: 7
Protein, UA: NEGATIVE
RBC UA: NEGATIVE
Spec Grav, UA: 1.02
UROBILINOGEN UA: 0.2

## 2015-06-27 LAB — HEMOGLOBIN A1C: HEMOGLOBIN A1C: 6.5 % (ref 4.6–6.5)

## 2015-06-27 LAB — HEPATIC FUNCTION PANEL
ALBUMIN: 3.9 g/dL (ref 3.5–5.2)
ALK PHOS: 87 U/L (ref 39–117)
ALT: 25 U/L (ref 0–53)
AST: 18 U/L (ref 0–37)
BILIRUBIN DIRECT: 0.2 mg/dL (ref 0.0–0.3)
TOTAL PROTEIN: 6.6 g/dL (ref 6.0–8.3)
Total Bilirubin: 0.8 mg/dL (ref 0.2–1.2)

## 2015-06-27 LAB — TSH: TSH: 0.53 u[IU]/mL (ref 0.35–4.50)

## 2015-06-27 LAB — MICROALBUMIN / CREATININE URINE RATIO
Creatinine,U: 174.1 mg/dL
Microalb Creat Ratio: 0.4 mg/g (ref 0.0–30.0)
Microalb, Ur: 0.7 mg/dL (ref 0.0–1.9)

## 2015-07-04 ENCOUNTER — Encounter: Payer: Self-pay | Admitting: Adult Health

## 2015-07-04 ENCOUNTER — Ambulatory Visit (INDEPENDENT_AMBULATORY_CARE_PROVIDER_SITE_OTHER): Payer: BC Managed Care – PPO | Admitting: Adult Health

## 2015-07-04 VITALS — BP 112/72 | Temp 98.0°F | Ht 71.0 in | Wt 240.8 lb

## 2015-07-04 DIAGNOSIS — Z0000 Encounter for general adult medical examination without abnormal findings: Secondary | ICD-10-CM | POA: Diagnosis not present

## 2015-07-04 DIAGNOSIS — I10 Essential (primary) hypertension: Secondary | ICD-10-CM | POA: Diagnosis not present

## 2015-07-04 DIAGNOSIS — IMO0001 Reserved for inherently not codable concepts without codable children: Secondary | ICD-10-CM

## 2015-07-04 DIAGNOSIS — E1165 Type 2 diabetes mellitus with hyperglycemia: Secondary | ICD-10-CM

## 2015-07-04 MED ORDER — GLIPIZIDE 5 MG PO TABS: 5.0000 mg | ORAL_TABLET | Freq: Two times a day (BID) | ORAL | Status: DC

## 2015-07-04 NOTE — Patient Instructions (Signed)
It was great seeing you again!  Continue to do what you are doing! You are making great strides.   Follow up with me in 6 months for follow up regarding your diabetes.

## 2015-07-04 NOTE — Progress Notes (Signed)
Subjective:    Patient ID: Trevor Wheeler, male    DOB: 06-Apr-1946, 69 y.o.   MRN: [id number]  HPI Patient presents for yearly preventative medicine examination. Medicare questionnaire was completed  All immunizations and health maintenance protocols were reviewed with the patient and needed orders were placed.  Appropriate screening laboratory values were reviewed with the patient including screening of hyperlipidemia, renal function and hepatic function. If indicated by BPH, a PSA was ordered.  Medication reconciliation,  past medical history, social history, problem list and allergies were reviewed in detail with the patient  Goals were established with regard to weight loss, exercise, and  diet in compliance with medications. He is eating much healthier and exercising more. His A1c is controlled and last was  Lab Results  Component Value Date   HGBA1C 6.5 06/27/2015    End of life planning was discussed.- He does not have an advanced directives or living will   He is feeling well and has been working on diet and exercise, specifically diet and portion control.   He has no complaints   Review of Systems  Constitutional: Negative.   HENT: Negative.   Eyes: Negative.   Respiratory: Negative.   Cardiovascular: Negative.   Gastrointestinal: Negative.   Endocrine: Negative.   Genitourinary: Negative.   Musculoskeletal: Negative.   Skin: Negative.   Allergic/Immunologic: Negative.   Neurological: Negative.   Hematological: Negative.   Psychiatric/Behavioral: Negative.    Past Medical History  Diagnosis Date  . Hypertension   . Type II diabetes mellitus ([place]) dx'd 05/05/2015  . CVA (cerebral vascular accident) ([place]) 05/04/2015    L cerebellar, "only problem I have is w/balance" 05/05/2015)    Social History   Social History  . Marital Status: Married    Spouse Name: N/A  . Number of Children: N/A  . Years of Education: N/A   Occupational History  . Not on  file.   Social History Main Topics  . Smoking status: Former Smoker -- 1.00 packs/day for 20 years    Types: Cigarettes    Quit date: 05/21/1974  . Smokeless tobacco: Never Used     Comment: quit smoking in the 1970's  . Alcohol Use: No  . Drug Use: No  . Sexual Activity: Not Currently   Other Topics Concern  . Not on file   Social History Narrative   Employed as a Therapist, music.    Married for 30 years   Has two daughters who live locally.        Past Surgical History  Procedure Laterality Date  . No past surgeries    . Ep implantable device N/A 05/07/2015    Procedure: Loop Recorder Insertion;  Surgeon: Sanda Klein, MD;  Location: [place] [facility];  Service: Cardiovascular;  Laterality: N/A;  . Tee without cardioversion N/A 05/07/2015    Procedure: TRANSESOPHAGEAL ECHOCARDIOGRAM (TEE);  Surgeon: Skeet Latch, MD;  Location: [facility] ENDOSCOPY;  Service: Cardiovascular;  Laterality: N/A;    Family History  Problem Relation Age of Onset  . Heart disease Mother   . Cancer Brother     Colon Cancer  . Heart disease Brother     Allergies  Allergen Reactions  . Penicillins Hives    Current Outpatient Prescriptions on File Prior to Visit  Medication Sig Dispense Refill  . atorvastatin (LIPITOR) 20 MG tablet Take 1 tablet (20 mg total) by mouth daily at 6 PM. 90 tablet 3  . dabigatran (PRADAXA) 150 MG CAPS capsule  Take 150 mg by mouth 2 (two) times daily. CLINICAL TRIAL THAT CAN LAST UP TO 3 YEARS    . glipiZIDE (GLUCOTROL) 5 MG tablet Take 1 tablet (5 mg total) by mouth 2 (two) times daily before a meal. 60 tablet 3  . lisinopril (PRINIVIL,ZESTRIL) 10 MG tablet Take 1 tablet (10 mg total) by mouth daily. 90 tablet 1  . NON FORMULARY Acetylsalicylic acid tablets 470 mg or placebo 1 time a day   FOR CLINICAL TRIAL     No current facility-administered medications on file prior to visit.    BP 112/72 mmHg  Temp(Src) 98 F (36.7 C) (Oral)  Ht 5\' 11"  (1.803  m)  Wt 240 lb 12.8 oz (109.226 kg)  BMI 33.60 kg/m2       Objective:   Physical Exam  Constitutional: He is oriented to person, place, and time. He appears well-developed and well-nourished. No distress.  HENT:  Head: Normocephalic and atraumatic.  Right Ear: External ear normal.  Left Ear: External ear normal.  Nose: Nose normal.  Mouth/Throat: Oropharynx is clear and moist. No oropharyngeal exudate.  Eyes: Conjunctivae and EOM are normal. Pupils are equal, round, and reactive to light. Right eye exhibits no discharge. Left eye exhibits no discharge. No scleral icterus.  Neck: Normal range of motion. Neck supple. No JVD present. No tracheal deviation present. No thyromegaly present.  Cardiovascular: Normal rate, regular rhythm, normal heart sounds and intact distal pulses.  Exam reveals no gallop and no friction rub.   No murmur heard. No carotid bruit  Pulmonary/Chest: Effort normal and breath sounds normal. No stridor. No respiratory distress. He has no wheezes. He has no rales. He exhibits no tenderness.  Abdominal: Soft. Bowel sounds are normal. He exhibits no distension and no mass. There is no tenderness. There is no rebound and no guarding.  Genitourinary: Rectum normal, prostate normal and penis normal. Guaiac negative stool. No penile tenderness.  Musculoskeletal: Normal range of motion. He exhibits no edema or tenderness.  Lymphadenopathy:    He has no cervical adenopathy.  Neurological: He is alert and oriented to person, place, and time. He has normal reflexes. No cranial nerve deficit. Coordination normal.  Skin: Skin is warm and dry. No rash noted. He is not diaphoretic. No erythema. No pallor.  Psychiatric: He has a normal mood and affect. His behavior is normal. Judgment and thought content normal.  Nursing note and vitals reviewed.     Assessment & Plan:  1. Routine general medical examination at a health care facility - Follow up in 6 months for A1c recheck -  Follow up in one year for CPE - Follow up sooner if needed - Continue to work on diet and exercise - Note for return to work given - Advanced directives and living will info given to patient.   2. Uncontrolled type 2 diabetes mellitus without complication, without long-term current use of insulin (HCC) - Blood sugars much more controlled with medication, diet and exercise - glipiZIDE (GLUCOTROL) 5 MG tablet; Take 1 tablet (5 mg total) by mouth 2 (two) times daily before a meal.  Dispense: 90 tablet; Refill: 3  3. Essential hypertension - Much better controlled since starting lisinopril.

## 2015-07-07 ENCOUNTER — Ambulatory Visit (INDEPENDENT_AMBULATORY_CARE_PROVIDER_SITE_OTHER): Payer: BC Managed Care – PPO | Admitting: *Deleted

## 2015-07-07 ENCOUNTER — Encounter: Payer: Self-pay | Admitting: Cardiovascular Disease

## 2015-07-07 DIAGNOSIS — I638 Other cerebral infarction: Secondary | ICD-10-CM | POA: Diagnosis not present

## 2015-07-07 DIAGNOSIS — I6389 Other cerebral infarction: Secondary | ICD-10-CM

## 2015-07-09 NOTE — Progress Notes (Signed)
LOOP RECORDER  

## 2015-07-22 LAB — CUP PACEART REMOTE DEVICE CHECK: Date Time Interrogation Session: 20161016183556

## 2015-07-22 NOTE — Progress Notes (Signed)
Carelink summary report received. Battery status OK. Normal device function. No new symptom episodes, tachy episodes, brady, or pause episodes. No new AF episodes. Monthly summary reports and ROV with [place] on 08/06/15 at 9:30am.

## 2015-08-05 ENCOUNTER — Ambulatory Visit (INDEPENDENT_AMBULATORY_CARE_PROVIDER_SITE_OTHER): Payer: BC Managed Care – PPO | Admitting: *Deleted

## 2015-08-05 DIAGNOSIS — I638 Other cerebral infarction: Secondary | ICD-10-CM | POA: Diagnosis not present

## 2015-08-05 DIAGNOSIS — I6389 Other cerebral infarction: Secondary | ICD-10-CM

## 2015-08-05 NOTE — Progress Notes (Signed)
Carelink Summary Report / Loop recorder 

## 2015-08-06 ENCOUNTER — Encounter: Payer: Self-pay | Admitting: Cardiovascular Disease

## 2015-08-06 ENCOUNTER — Ambulatory Visit (INDEPENDENT_AMBULATORY_CARE_PROVIDER_SITE_OTHER): Payer: Medicare Other | Admitting: Cardiovascular Disease

## 2015-08-06 VITALS — BP 133/77 | HR 72 | Ht 71.0 in | Wt 234.2 lb

## 2015-08-06 DIAGNOSIS — I639 Cerebral infarction, unspecified: Secondary | ICD-10-CM | POA: Diagnosis not present

## 2015-08-06 DIAGNOSIS — E785 Hyperlipidemia, unspecified: Secondary | ICD-10-CM | POA: Diagnosis not present

## 2015-08-06 NOTE — Progress Notes (Signed)
Patient ID: Trevor Wheeler, male   DOB: 22-Jan-1946, 69 y.o.   MRN: [id number]     Cardiology Office Note   Date:  08/06/2015   ID:  Trevor Wheeler 09-Sep-1946, MRN [id number]  PCP:  Dorothyann Peng, NP  Cardiologist:   Sanda Klein, MD   Chief Complaint  Patient presents with  . Follow-up    no chest pain, no shortness of breath, no edema, pain in legs-arthritis in left leg, no cramping in legs, no lightheadedness, no dizziness      History of Present Illness: Trevor Wheeler is a 69 y.o. male who presents for  Implantable loop recorder follow-up, hypertension, hyperlipidemia. He had a left cerebellar stroke in August but following physical therapy/rehabilitation his dizziness is much improved. He is walking without assistance devices. He has retired from his job as Therapist, music at [employer].   Loop recorder has not shown any evidence of atrial fibrillation or other meaningful arrhythmia. His blood pressure control is good. He is enrolled in RESPECT ESUS clinical trial of Pradaxa versus placebo and is taking a statin.  No bleeding events.   he has no cardiovascular complaints.   Past Medical History  Diagnosis Date  . Hypertension   . Type II diabetes mellitus ([place]) dx'd 05/05/2015  . CVA (cerebral vascular accident) ([place]) 05/04/2015    L cerebellar, "only problem I have is w/balance" 05/05/2015)    Past Surgical History  Procedure Laterality Date  . No past surgeries    . Ep implantable device N/A 05/07/2015    Procedure: Loop Recorder Insertion;  Surgeon: Sanda Klein, MD;  Location: [place] [facility];  Service: Cardiovascular;  Laterality: N/A;  . Tee without cardioversion N/A 05/07/2015    Procedure: TRANSESOPHAGEAL ECHOCARDIOGRAM (TEE);  Surgeon: Skeet Latch, MD;  Location: [facility] ENDOSCOPY;  Service: Cardiovascular;  Laterality: N/A;     Current Outpatient Prescriptions  Medication Sig Dispense Refill  . atorvastatin (LIPITOR) 20 MG  tablet Take 1 tablet (20 mg total) by mouth daily at 6 PM. 90 tablet 3  . dabigatran (PRADAXA) 150 MG CAPS capsule Take 150 mg by mouth 2 (two) times daily. CLINICAL TRIAL THAT CAN LAST UP TO 3 YEARS    . glipiZIDE (GLUCOTROL) 5 MG tablet Take 1 tablet (5 mg total) by mouth 2 (two) times daily before a meal. 90 tablet 3  . lisinopril (PRINIVIL,ZESTRIL) 10 MG tablet Take 1 tablet (10 mg total) by mouth daily. 90 tablet 1  . NON FORMULARY Acetylsalicylic acid tablets 123XX123 mg or placebo 1 time a day   FOR CLINICAL TRIAL     No current facility-administered medications for this visit.    Allergies:   Penicillins    Social History:  The patient  reports that he quit smoking about 41 years ago. His smoking use included Cigarettes. He has a 20 pack-year smoking history. He has never used smokeless tobacco. He reports that he does not drink alcohol or use illicit drugs.   Family History:  The patient's family history includes Cancer in his brother; Heart disease in his brother and mother.    ROS:  Please see the history of present illness.    Otherwise, review of systems positive for none.   All other systems are reviewed and negative.    PHYSICAL EXAM: VS:  BP 133/77 mmHg  Pulse 72  Ht 5\' 11"  (1.803 m)  Wt 234 lb 4 oz (106.255 kg)  BMI 32.69 kg/m2 , BMI Body mass index is  32.69 kg/(m^2).  General: Alert, oriented x3, no distress Head: no evidence of trauma, PERRL, EOMI, no exophtalmos or lid lag, no myxedema, no xanthelasma; normal ears, nose and oropharynx Neck: normal jugular venous pulsations and no hepatojugular reflux; brisk carotid pulses without delay and no carotid bruits Chest: clear to auscultation, no signs of consolidation by percussion or palpation, normal fremitus, symmetrical and full respiratory excursions Cardiovascular: normal position and quality of the apical impulse, regular rhythm, normal first and second heart sounds, no murmurs, rubs or gallops Abdomen: no  tenderness or distention, no masses by palpation, no abnormal pulsatility or arterial bruits, normal bowel sounds, no hepatosplenomegaly Extremities: no clubbing, cyanosis or edema; 2+ radial, ulnar and brachial pulses bilaterally; 2+ right femoral, posterior tibial and dorsalis pedis pulses; 2+ left femoral, posterior tibial and dorsalis pedis pulses; no subclavian or femoral bruits Neurological: grossly nonfocal Psych: euthymic mood, full affect   EKG:  EKG is not ordered today.   Recent Labs: 06/27/2015: ALT 25; BUN 9; Creatinine, Ser 0.85; Hemoglobin 14.1; Platelets 243.0; Potassium 4.5; Sodium 141; TSH 0.53    Lipid Panel    Component Value Date/Time   CHOL 118 06/27/2015 0902   TRIG 105.0 06/27/2015 0902   HDL 30.00* 06/27/2015 0902   CHOLHDL 4 06/27/2015 0902   VLDL 21.0 06/27/2015 0902   LDLCALC 67 06/27/2015 0902   LDLDIRECT 182.2 08/05/2010 0838      Wt Readings from Last 3 Encounters:  08/06/15 234 lb 4 oz (106.255 kg)  07/04/15 240 lb 12.8 oz (109.226 kg)  06/19/15 237 lb 11.2 oz (107.82 kg)    .   ASSESSMENT AND PLAN:  1.  Cryptogenic stroke in double-blind trial of dabigatran versus ASA 2.  Status post implantable loop recorder, no arrhythmia recorded in first 3 months post stroke 3.  Essential hypertension, controlled 4.  Hyperlipidemia on statin therapy 5.  Type 2 diabetes mellitus on oral antidiabetics   Regular physical exercise and healthy diet recommended. He was already mildly obese and has gained a little weight. Target waistline down to 34 inches    Current medicines are reviewed at length with the patient today.  The patient does not have concerns regarding medicines.  The following changes have been made:  no change  Labs/ tests ordered today include:  No orders of the defined types were placed in this encounter.     Patient Instructions  Dr. Sallyanne Kuster recommends that you schedule a follow-up appointment in: ONE  YEAR        SignedSanda Klein, MD  08/06/2015 10:03 AM    Sanda Klein, MD, [facility] [phone] office [phone] pager

## 2015-08-06 NOTE — Patient Instructions (Signed)
Dr. Croitoru recommends that you schedule a follow-up appointment in: ONE YEAR   

## 2015-08-13 LAB — CUP PACEART INCLINIC DEVICE CHECK: Date Time Interrogation Session: 20161116153208

## 2015-08-22 ENCOUNTER — Encounter: Payer: Self-pay | Admitting: Cardiovascular Disease

## 2015-09-04 ENCOUNTER — Ambulatory Visit (INDEPENDENT_AMBULATORY_CARE_PROVIDER_SITE_OTHER): Payer: Medicare Other | Admitting: *Deleted

## 2015-09-04 DIAGNOSIS — I639 Cerebral infarction, unspecified: Secondary | ICD-10-CM | POA: Diagnosis not present

## 2015-09-05 NOTE — Progress Notes (Signed)
Carelink Summary Report / Loop Recorder 

## 2015-09-08 LAB — CUP PACEART REMOTE DEVICE CHECK: MDC IDC SESS DTM: 20161115184204

## 2015-10-06 ENCOUNTER — Ambulatory Visit (INDEPENDENT_AMBULATORY_CARE_PROVIDER_SITE_OTHER): Payer: Medicare Other | Admitting: *Deleted

## 2015-10-06 DIAGNOSIS — I639 Cerebral infarction, unspecified: Secondary | ICD-10-CM | POA: Diagnosis not present

## 2015-10-07 NOTE — Progress Notes (Signed)
Carelink Summary Report / Loop Recorder 

## 2015-10-18 LAB — CUP PACEART REMOTE DEVICE CHECK: MDC IDC SESS DTM: 20161215183611

## 2015-11-03 ENCOUNTER — Ambulatory Visit (INDEPENDENT_AMBULATORY_CARE_PROVIDER_SITE_OTHER): Payer: Medicare Other | Admitting: *Deleted

## 2015-11-03 DIAGNOSIS — I639 Cerebral infarction, unspecified: Secondary | ICD-10-CM | POA: Diagnosis not present

## 2015-11-03 NOTE — Progress Notes (Signed)
Carelink Summary Report / Loop Recorder 

## 2015-11-24 LAB — CUP PACEART REMOTE DEVICE CHECK: Date Time Interrogation Session: 20170114190528

## 2015-11-24 NOTE — Progress Notes (Signed)
Carelink summary report received. Battery status OK. Normal device function. No new symptom episodes, tachy episodes, brady, or pause episodes. No new AF episodes. Monthly summary reports and ROV/PRN 

## 2015-11-26 LAB — CUP PACEART REMOTE DEVICE CHECK: Date Time Interrogation Session: 20170213193550

## 2015-11-26 NOTE — Progress Notes (Signed)
Carelink summary report received. Battery status OK. Normal device function. No new symptom episodes, tachy episodes, brady, or pause episodes. No new AF episodes. Monthly summary reports and ROV/PRN 

## 2015-12-03 ENCOUNTER — Ambulatory Visit (INDEPENDENT_AMBULATORY_CARE_PROVIDER_SITE_OTHER): Payer: Medicare Other | Admitting: *Deleted

## 2015-12-03 DIAGNOSIS — I639 Cerebral infarction, unspecified: Secondary | ICD-10-CM

## 2015-12-04 NOTE — Progress Notes (Signed)
Carelink Summary Report / Loop Recorder 

## 2015-12-08 ENCOUNTER — Encounter (INDEPENDENT_AMBULATORY_CARE_PROVIDER_SITE_OTHER): Payer: Self-pay

## 2015-12-08 DIAGNOSIS — Z0289 Encounter for other administrative examinations: Secondary | ICD-10-CM

## 2015-12-11 ENCOUNTER — Other Ambulatory Visit: Payer: Self-pay | Admitting: Adult Health

## 2015-12-22 LAB — HM DIABETES EYE EXAM

## 2016-01-02 ENCOUNTER — Ambulatory Visit (INDEPENDENT_AMBULATORY_CARE_PROVIDER_SITE_OTHER): Payer: Medicare Other | Admitting: *Deleted

## 2016-01-02 DIAGNOSIS — I639 Cerebral infarction, unspecified: Secondary | ICD-10-CM | POA: Diagnosis not present

## 2016-01-05 NOTE — Progress Notes (Signed)
Carelink Summary Report / Loop Recorder 

## 2016-01-08 ENCOUNTER — Other Ambulatory Visit: Payer: Self-pay | Admitting: Adult Health

## 2016-02-02 ENCOUNTER — Ambulatory Visit (INDEPENDENT_AMBULATORY_CARE_PROVIDER_SITE_OTHER): Payer: Medicare Other | Admitting: *Deleted

## 2016-02-02 DIAGNOSIS — I639 Cerebral infarction, unspecified: Secondary | ICD-10-CM

## 2016-02-02 NOTE — Progress Notes (Signed)
Carelink Summary Report / Loop Recorder 

## 2016-02-03 ENCOUNTER — Encounter: Payer: Self-pay | Admitting: Adult Health

## 2016-02-03 ENCOUNTER — Ambulatory Visit (INDEPENDENT_AMBULATORY_CARE_PROVIDER_SITE_OTHER): Payer: BC Managed Care – PPO | Admitting: Adult Health

## 2016-02-03 VITALS — BP 158/78 | Temp 97.9°F | Ht 71.0 in | Wt 240.8 lb

## 2016-02-03 DIAGNOSIS — I10 Essential (primary) hypertension: Secondary | ICD-10-CM

## 2016-02-03 DIAGNOSIS — E1159 Type 2 diabetes mellitus with other circulatory complications: Secondary | ICD-10-CM | POA: Diagnosis not present

## 2016-02-03 DIAGNOSIS — I639 Cerebral infarction, unspecified: Secondary | ICD-10-CM

## 2016-02-03 LAB — POCT GLYCOSYLATED HEMOGLOBIN (HGB A1C): Hemoglobin A1C: 6.7

## 2016-02-03 MED ORDER — GLUCOSE BLOOD VI STRP: ORAL_STRIP | Status: DC

## 2016-02-03 MED ORDER — GLIPIZIDE 10 MG PO TABS: ORAL_TABLET | ORAL | Status: DC

## 2016-02-03 NOTE — Patient Instructions (Addendum)
It was great seeing you again   Your A1c today is 6.7 . Your last one was 6.5.   I would like you to take 5 mg of glipizide in the morning and 10 mg at night.   Follow up in October for your physical.

## 2016-02-03 NOTE — Progress Notes (Signed)
Subjective:    Patient ID: Trevor Wheeler, male    DOB: 02-26-1946, 70 y.o.   MRN: [id number]  HPI  70 year old male who presents to the office for 6 months follow-up regarding diabetes and hypertension.  His last A1c was 6.5 in October, previous to that it was 8.1. He is currently taking glipizide 5 mg. He reports that his blood sugars in the morning in the 140-160's, normal during the day and again elevated in the evening.   He is exercising in the morning with weights and walks his dog two miles a day. He is trying to eat healthy.   He continues to follow up with Cardiology yearly due to CVA in 2016.   He denies any acute complaints and is feeling well.   Review of Systems  Constitutional: Negative.   Respiratory: Negative.   Cardiovascular: Negative.   Genitourinary: Negative.   Musculoskeletal: Positive for arthralgias (left knee).  Neurological: Negative.    Past Medical History  Diagnosis Date  . Hypertension   . Type II diabetes mellitus ([place]) dx'd 05/05/2015  . 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 Marital Status: Married    Spouse Name: N/A  . Number of Children: N/A  . Years of Education: N/A   Occupational History  . Not on file.   Social History Main Topics  . Smoking status: Former Smoker -- 1.00 packs/day for 20 years    Types: Cigarettes    Quit date: 05/21/1974  . Smokeless tobacco: Never Used     Comment: quit smoking in the 1970's  . Alcohol Use: No  . Drug Use: No  . Sexual Activity: Not Currently   Other Topics Concern  . Not on file   Social History Narrative   Employed as a Therapist, music.    Married for 30 years   Has two daughters who live locally.        Past Surgical History  Procedure Laterality Date  . No past surgeries    . Ep implantable device N/A 05/07/2015    Procedure: Loop Recorder  Insertion;  Surgeon: Sanda Klein, MD;  Location: [facility];  Service: Cardiovascular;  Laterality: N/A;  . Tee without cardioversion N/A 05/07/2015    Procedure: TRANSESOPHAGEAL ECHOCARDIOGRAM (TEE);  Surgeon: Skeet Latch, MD;  Location: [facility] ENDOSCOPY;  Service: Cardiovascular;  Laterality: N/A;    Family History  Problem Relation Age of Onset  . Heart disease Mother   . Cancer Brother     Colon Cancer  . Heart disease Brother     Allergies  Allergen Reactions  . Penicillins Hives    Current Outpatient Prescriptions on File Prior to Visit  Medication Sig Dispense Refill  . atorvastatin (LIPITOR) 20 MG tablet Take 1 tablet (20 mg total) by mouth daily at 6 PM. 90 tablet 3  . dabigatran (PRADAXA) 150 MG CAPS capsule Take 150 mg by mouth 2 (two) times daily. CLINICAL TRIAL THAT CAN LAST UP TO 3 YEARS    . glipiZIDE (GLUCOTROL) 5 MG tablet TAKE 1 TABLET (5 MG TOTAL) BY MOUTH 2 (TWO) TIMES DAILY BEFORE A MEAL. 90 tablet 3  . lisinopril (PRINIVIL,ZESTRIL) 10 MG tablet TAKE 1 TABLET (10 MG TOTAL) BY MOUTH DAILY. 90 tablet 1  . NON FORMULARY Acetylsalicylic acid tablets 123XX123 mg or placebo 1 time a day  FOR CLINICAL TRIAL     No current facility-administered medications on file prior to visit.    BP 158/78 mmHg  Temp(Src) 97.9 F (36.6 C) (Oral)  Ht 5\' 11"  (1.803 m)  Wt 240 lb 12.8 oz (109.226 kg)  BMI 33.60 kg/m2       Objective:   Physical Exam  Constitutional: He is oriented to person, place, and time. He appears well-developed and well-nourished. No distress.  Cardiovascular: Normal rate, regular rhythm, normal heart sounds and intact distal pulses.  Exam reveals no gallop and no friction rub.   No murmur heard. Pulmonary/Chest: Effort normal and breath sounds normal. No respiratory distress. He has no wheezes. He has no rales. He exhibits no tenderness.  Musculoskeletal: Normal range of motion. He exhibits no edema or tenderness.  Neurological: He is alert  and oriented to person, place, and time.  Skin: Skin is warm and dry. No rash noted. He is not diaphoretic. No erythema. No pallor.  Psychiatric: He has a normal mood and affect. His behavior is normal. Judgment and thought content normal.  Nursing note and vitals reviewed.     Assessment & Plan:  1. Essential hypertension - On lisinopril 10 mg.  - Neurology is monitoring his blood pressure.  BP Readings from Last 3 Encounters:  02/03/16 158/78  08/06/15 133/77  07/04/15 112/72     2. Type 2 diabetes mellitus with other circulatory complications (HCC)  - POC HgB A1c - 6.7  - glucose blood test strip; Use as instructed  Dispense: 100 each; Refill: 12 - glipiZIDE (GLUCOTROL) 10 MG tablet; Take before evening meal  Dispense: 90 tablet; Refill: 3 - Continue wit 5 mg Glipizide in the morning.   Dorothyann Peng, NP

## 2016-02-13 LAB — CUP PACEART REMOTE DEVICE CHECK: Date Time Interrogation Session: 20170315194044

## 2016-02-15 LAB — CUP PACEART REMOTE DEVICE CHECK: MDC IDC SESS DTM: 20170414200534

## 2016-02-15 NOTE — Progress Notes (Signed)
Carelink summary report received. Battery status OK. Normal device function. No new symptom episodes, tachy episodes, brady, or pause episodes. No new AF episodes. Monthly summary reports and ROV/PRN 

## 2016-03-02 ENCOUNTER — Ambulatory Visit (INDEPENDENT_AMBULATORY_CARE_PROVIDER_SITE_OTHER): Payer: BC Managed Care – PPO | Admitting: *Deleted

## 2016-03-02 DIAGNOSIS — I639 Cerebral infarction, unspecified: Secondary | ICD-10-CM | POA: Diagnosis not present

## 2016-03-03 NOTE — Progress Notes (Signed)
Carelink Summary Report / Loop Recorder 

## 2016-03-09 LAB — CUP PACEART REMOTE DEVICE CHECK: MDC IDC SESS DTM: 20170514203602

## 2016-03-12 ENCOUNTER — Encounter (INDEPENDENT_AMBULATORY_CARE_PROVIDER_SITE_OTHER): Payer: Self-pay

## 2016-03-12 DIAGNOSIS — Z0289 Encounter for other administrative examinations: Secondary | ICD-10-CM

## 2016-03-19 ENCOUNTER — Other Ambulatory Visit: Payer: Self-pay | Admitting: Neurology

## 2016-03-24 ENCOUNTER — Other Ambulatory Visit: Payer: Self-pay

## 2016-03-24 DIAGNOSIS — I639 Cerebral infarction, unspecified: Secondary | ICD-10-CM

## 2016-03-24 MED ORDER — ATORVASTATIN CALCIUM 20 MG PO TABS
20.0000 mg | ORAL_TABLET | Freq: Every day | ORAL | Status: DC
Start: 2016-03-24 — End: 2016-06-23

## 2016-03-25 LAB — CUP PACEART REMOTE DEVICE CHECK: Date Time Interrogation Session: 20170613204107

## 2016-04-01 ENCOUNTER — Ambulatory Visit (INDEPENDENT_AMBULATORY_CARE_PROVIDER_SITE_OTHER): Payer: Medicare Other | Admitting: *Deleted

## 2016-04-01 DIAGNOSIS — I639 Cerebral infarction, unspecified: Secondary | ICD-10-CM

## 2016-04-02 NOTE — Progress Notes (Signed)
Carelink Summary Report / Loop Recorder 

## 2016-04-09 LAB — CUP PACEART REMOTE DEVICE CHECK: MDC IDC SESS DTM: 20170713210736

## 2016-05-03 ENCOUNTER — Ambulatory Visit (INDEPENDENT_AMBULATORY_CARE_PROVIDER_SITE_OTHER): Payer: Medicare Other | Admitting: *Deleted

## 2016-05-03 DIAGNOSIS — I639 Cerebral infarction, unspecified: Secondary | ICD-10-CM | POA: Diagnosis not present

## 2016-05-03 NOTE — Progress Notes (Signed)
Carelink Summary Report / Loop Recorder 

## 2016-05-31 ENCOUNTER — Ambulatory Visit (INDEPENDENT_AMBULATORY_CARE_PROVIDER_SITE_OTHER): Payer: Medicare Other | Admitting: *Deleted

## 2016-05-31 DIAGNOSIS — I639 Cerebral infarction, unspecified: Secondary | ICD-10-CM

## 2016-05-31 LAB — CUP PACEART REMOTE DEVICE CHECK: Date Time Interrogation Session: 20170812214132

## 2016-05-31 NOTE — Progress Notes (Signed)
Carelink summary report received. Battery status OK. Normal device function. Good histograms. No new symptom episodes, tachy episodes, brady, or pause episodes. No new AF episodes. Monthly summary reports and ROV/PRN

## 2016-06-01 NOTE — Progress Notes (Signed)
Carelink Summary Report / Loop Recorder 

## 2016-06-04 DIAGNOSIS — H2513 Age-related nuclear cataract, bilateral: Secondary | ICD-10-CM | POA: Diagnosis not present

## 2016-06-04 DIAGNOSIS — H25041 Posterior subcapsular polar age-related cataract, right eye: Secondary | ICD-10-CM | POA: Diagnosis not present

## 2016-06-18 ENCOUNTER — Other Ambulatory Visit (INDEPENDENT_AMBULATORY_CARE_PROVIDER_SITE_OTHER): Payer: Medicare Other

## 2016-06-18 DIAGNOSIS — Z0000 Encounter for general adult medical examination without abnormal findings: Secondary | ICD-10-CM | POA: Diagnosis not present

## 2016-06-18 LAB — CBC WITH DIFFERENTIAL/PLATELET
BASOS ABS: 0 10*3/uL (ref 0.0–0.1)
Basophils Relative: 0.5 % (ref 0.0–3.0)
EOS ABS: 0.3 10*3/uL (ref 0.0–0.7)
EOS PCT: 3.5 % (ref 0.0–5.0)
HEMATOCRIT: 41 % (ref 39.0–52.0)
HEMOGLOBIN: 13.5 g/dL (ref 13.0–17.0)
LYMPHS ABS: 2.5 10*3/uL (ref 0.7–4.0)
Lymphocytes Relative: 30.1 % (ref 12.0–46.0)
MCHC: 33 g/dL (ref 30.0–36.0)
MCV: 82.3 fl (ref 78.0–100.0)
MONO ABS: 0.6 10*3/uL (ref 0.1–1.0)
Monocytes Relative: 7.1 % (ref 3.0–12.0)
Neutro Abs: 4.9 10*3/uL (ref 1.4–7.7)
Neutrophils Relative %: 58.8 % (ref 43.0–77.0)
PLATELETS: 197 10*3/uL (ref 150.0–400.0)
RBC: 4.98 Mil/uL (ref 4.22–5.81)
RDW: 13.6 % (ref 11.5–15.5)
WBC: 8.3 10*3/uL (ref 4.0–10.5)

## 2016-06-18 LAB — BASIC METABOLIC PANEL
BUN: 10 mg/dL (ref 6–23)
CALCIUM: 9 mg/dL (ref 8.4–10.5)
CO2: 28 mEq/L (ref 19–32)
Chloride: 106 mEq/L (ref 96–112)
Creatinine, Ser: 0.89 mg/dL (ref 0.40–1.50)
GFR: 108.88 mL/min (ref >60.00)
GLUCOSE: 135 mg/dL — AB (ref 70–99)
POTASSIUM: 4.3 meq/L (ref 3.5–5.1)
Sodium: 141 mEq/L (ref 135–145)

## 2016-06-18 LAB — MICROALBUMIN / CREATININE URINE RATIO
CREATININE, U: 217.5 mg/dL
MICROALB/CREAT RATIO: 1.3 mg/g (ref 0.0–30.0)
Microalb, Ur: 2.8 mg/dL — ABNORMAL HIGH (ref 0.0–1.9)

## 2016-06-18 LAB — POC URINALSYSI DIPSTICK (AUTOMATED)
Bilirubin, UA: NEGATIVE
Blood, UA: NEGATIVE
GLUCOSE UA: NEGATIVE
Ketones, UA: NEGATIVE
Leukocytes, UA: NEGATIVE
NITRITE UA: NEGATIVE
SPEC GRAV UA: 1.02
UROBILINOGEN UA: 0.2
pH, UA: 7

## 2016-06-18 LAB — LIPID PANEL
CHOL/HDL RATIO: 4
CHOLESTEROL: 133 mg/dL (ref 0–200)
HDL: 31.9 mg/dL — AB (ref >39.00)
LDL Cholesterol: 76 mg/dL (ref 0–99)
NonHDL: 101.41
TRIGLYCERIDES: 127 mg/dL (ref 0.0–149.0)
VLDL: 25.4 mg/dL (ref 0.0–40.0)

## 2016-06-18 LAB — PSA: PSA: 0.95 ng/mL (ref 0.10–4.00)

## 2016-06-18 LAB — HEMOGLOBIN A1C: HEMOGLOBIN A1C: 6.7 % — AB (ref 4.6–6.5)

## 2016-06-18 LAB — HEPATIC FUNCTION PANEL
ALBUMIN: 3.9 g/dL (ref 3.5–5.2)
ALT: 18 U/L (ref 0–53)
AST: 18 U/L (ref 0–37)
Alkaline Phosphatase: 77 U/L (ref 39–117)
BILIRUBIN DIRECT: 0.2 mg/dL (ref 0.0–0.3)
TOTAL PROTEIN: 6.6 g/dL (ref 6.0–8.3)
Total Bilirubin: 0.8 mg/dL (ref 0.2–1.2)

## 2016-06-18 LAB — TSH: TSH: 0.75 u[IU]/mL (ref 0.35–4.50)

## 2016-06-23 ENCOUNTER — Telehealth: Payer: Self-pay | Admitting: Adult Health

## 2016-06-23 ENCOUNTER — Ambulatory Visit (INDEPENDENT_AMBULATORY_CARE_PROVIDER_SITE_OTHER): Payer: Medicare Other | Admitting: Adult Health

## 2016-06-23 ENCOUNTER — Encounter: Payer: Self-pay | Admitting: Adult Health

## 2016-06-23 ENCOUNTER — Other Ambulatory Visit: Payer: Self-pay

## 2016-06-23 VITALS — BP 142/90 | Temp 98.3°F | Ht 71.0 in | Wt 239.8 lb

## 2016-06-23 DIAGNOSIS — Z23 Encounter for immunization: Secondary | ICD-10-CM

## 2016-06-23 DIAGNOSIS — E785 Hyperlipidemia, unspecified: Secondary | ICD-10-CM

## 2016-06-23 DIAGNOSIS — E1165 Type 2 diabetes mellitus with hyperglycemia: Secondary | ICD-10-CM | POA: Diagnosis not present

## 2016-06-23 DIAGNOSIS — IMO0001 Reserved for inherently not codable concepts without codable children: Secondary | ICD-10-CM

## 2016-06-23 DIAGNOSIS — I639 Cerebral infarction, unspecified: Secondary | ICD-10-CM

## 2016-06-23 DIAGNOSIS — I10 Essential (primary) hypertension: Secondary | ICD-10-CM | POA: Diagnosis not present

## 2016-06-23 DIAGNOSIS — Z0000 Encounter for general adult medical examination without abnormal findings: Secondary | ICD-10-CM

## 2016-06-23 MED ORDER — ATORVASTATIN CALCIUM 20 MG PO TABS: 20.0000 mg | ORAL_TABLET | Freq: Every day | ORAL | 3 refills | Status: DC

## 2016-06-23 MED ORDER — LISINOPRIL 10 MG PO TABS: ORAL_TABLET | ORAL | 1 refills | Status: DC

## 2016-06-23 MED ORDER — GLIPIZIDE 10 MG PO TABS: 10.0000 mg | ORAL_TABLET | Freq: Two times a day (BID) | ORAL | 3 refills | Status: DC

## 2016-06-23 MED ORDER — METFORMIN HCL 500 MG PO TABS
250.0000 mg | ORAL_TABLET | Freq: Two times a day (BID) | ORAL | 1 refills | Status: DC
Start: 2016-06-23 — End: 2016-07-06

## 2016-06-23 NOTE — Progress Notes (Signed)
Subjective:    Patient ID: Trevor Wheeler, male    DOB: 01-Oct-1945, 70 y.o.   MRN: [id number]  HPI  Patient presents for yearly preventative medicine examination. He is a pleasant 70 year old male who  has a past medical history of CVA (cerebral vascular accident) ([place]) (05/04/2015); Hypertension; and Type II diabetes mellitus ([place]) (dx'd 05/05/2015).   All immunizations and health maintenance protocols were reviewed with the patient and needed orders were placed.  Medication reconciliation,  past medical history, social history, problem list and allergies were reviewed in detail with the patient  Goals were established with regard to weight loss, exercise, and  diet in compliance with medications.He is exercising in the morning with weights and walks his dog two miles a day. He is trying to eat healthy.   He continues to follow up with Cardiology yearly due to CVA in 2016.   His blood pressure per his log is controlled at home, mostly in the [id number] systolic.   His blood sugars " are all over the place". He has a lot of readings in the 200's. He is currently taking Glipizide 5 mg in the morning and 10 mg at night.   He is up to date on his colonoscopy. He has had his diabetic eye exam.  Review of Systems  Constitutional: Negative.   HENT: Negative.   Eyes: Negative.   Respiratory: Negative.   Cardiovascular: Negative.   Gastrointestinal: Negative.   Endocrine: Negative.   Genitourinary: Negative.   Musculoskeletal: Negative.   Skin: Negative.   Allergic/Immunologic: Negative.   Hematological: Negative.   Psychiatric/Behavioral: Negative.   All other systems reviewed and are negative.  Past Medical History:  Diagnosis Date  . CVA (cerebral vascular accident) ([place]) 05/04/2015   L cerebellar, "only problem I have is w/balance" 05/05/2015)  . Hypertension   . Type II diabetes mellitus ([place]) dx'd 05/05/2015    Social History   Social History  . Marital status:  Married    Spouse name: N/A  . Number of children: N/A  . Years of education: N/A   Occupational History  . Not on file.   Social History Main Topics  . Smoking status: Former Smoker    Packs/day: 1.00    Years: 20.00    Types: Cigarettes    Quit date: 05/21/1974  . Smokeless tobacco: Never Used     Comment: quit smoking in the 1970's  . Alcohol use No  . Drug use: No  . Sexual activity: Not Currently   Other Topics Concern  . Not on file   Social History Narrative   Employed as a Therapist, music.    Married for 30 years   Has two daughters who live locally.        Past Surgical History:  Procedure Laterality Date  . EP IMPLANTABLE DEVICE N/A 05/07/2015   Procedure: Loop Recorder Insertion;  Surgeon: Sanda Klein, MD;  Location: [facility];  Service: Cardiovascular;  Laterality: N/A;  . NO PAST SURGERIES    . TEE WITHOUT CARDIOVERSION N/A 05/07/2015   Procedure: TRANSESOPHAGEAL ECHOCARDIOGRAM (TEE);  Surgeon: Skeet Latch, MD;  Location: [facility];  Service: Cardiovascular;  Laterality: N/A;    Family History  Problem Relation Age of Onset  . Heart disease Mother   . Cancer Brother     Colon Cancer  . Heart disease Brother     Allergies  Allergen Reactions  . Penicillins Hives    Current Outpatient Prescriptions on  File Prior to Visit  Medication Sig Dispense Refill  . atorvastatin (LIPITOR) 20 MG tablet Take 1 tablet (20 mg total) by mouth daily at 6 PM. 90 tablet 3  . dabigatran (PRADAXA) 150 MG CAPS capsule Take 150 mg by mouth 2 (two) times daily. CLINICAL TRIAL THAT CAN LAST UP TO 3 YEARS    . glipiZIDE (GLUCOTROL) 10 MG tablet Take before evening meal 90 tablet 3  . glipiZIDE (GLUCOTROL) 5 MG tablet TAKE 1 TABLET (5 MG TOTAL) BY MOUTH 2 (TWO) TIMES DAILY BEFORE A MEAL. 90 tablet 3  . glucose blood test strip Use as instructed 100 each 12  . lisinopril (PRINIVIL,ZESTRIL) 10 MG tablet TAKE 1 TABLET (10 MG TOTAL) BY MOUTH DAILY. 90  tablet 1  . NON FORMULARY Acetylsalicylic acid tablets 123XX123 mg or placebo 1 time a day   FOR CLINICAL TRIAL     No current facility-administered medications on file prior to visit.     BP (!) 142/90   Temp 98.3 F (36.8 C) (Oral)   Ht 5\' 11"  (1.803 m)   Wt 239 lb 12.8 oz (108.8 kg)   BMI 33.45 kg/m       Objective:   Physical Exam  Constitutional: He is oriented to person, place, and time. Vital signs are normal. He appears well-developed and well-nourished. No distress.  HENT:  Head: Normocephalic and atraumatic.  Right Ear: External ear normal.  Left Ear: External ear normal.  Nose: Nose normal.  Mouth/Throat: Oropharynx is clear and moist. He has dentures. No oropharyngeal exudate.  Eyes: Conjunctivae and EOM are normal. Pupils are equal, round, and reactive to light. Right eye exhibits no discharge. Left eye exhibits no discharge. No scleral icterus.  Neck: Normal range of motion. Neck supple. No JVD present. Carotid bruit is not present. No tracheal deviation present. No thyroid mass and no thyromegaly present.  Cardiovascular: Normal rate, regular rhythm, normal heart sounds and intact distal pulses.  Exam reveals no gallop and no friction rub.   No murmur heard. Pulmonary/Chest: Effort normal and breath sounds normal. No stridor. No respiratory distress. He has no wheezes. He has no rales. He exhibits no tenderness.  Abdominal: Soft. Bowel sounds are normal. He exhibits no distension and no mass. There is no tenderness. There is no rebound and no guarding.  Genitourinary: Rectal exam shows external hemorrhoid. Prostate is not enlarged and not tender. No penile tenderness.  Musculoskeletal: Normal range of motion. He exhibits no edema, tenderness or deformity.  Lymphadenopathy:    He has no cervical adenopathy.  Neurological: He is alert and oriented to person, place, and time. He displays normal reflexes. No cranial nerve deficit. He exhibits normal muscle tone.  Coordination normal.  Skin: Skin is warm and dry. No rash noted. He is not diaphoretic. No erythema. No pallor.  Psychiatric: He has a normal mood and affect. His behavior is normal. Judgment and thought content normal.  Nursing note and vitals reviewed.     Assessment & Plan:  1. Routine general medical examination at a health care facility - Reviewed labs in detail with patient and all questions answered - Educated on the importance of diabetic diet and exercise - Follow up in one year for CPE - Follow up sooner if needed   2. Essential hypertension - Well controlled at home - No change in medication at this time  3. Uncontrolled diabetes mellitus type 2 without complications, unspecified long term insulin use status (HCC) - POC A1c - 6.7  -  Will add metformin 250mg  BID and increase Glipizide to 10 mg daily.  - metFORMIN (GLUCOPHAGE) 500 MG tablet; Take 0.5 tablets (250 mg total) by mouth 2 (two) times daily with a meal.  Dispense: 90 tablet; Refill: 1 - glipiZIDE (GLUCOTROL) 10 MG tablet; Take 1 tablet (10 mg total) by mouth 2 (two) times daily before a meal. Take before evening meal  Dispense: 180 tablet; Refill: 3 - Follow up daily.   4. Need for influenza vaccination  - Flu vaccine HIGH DOSE PF (Fluzone High Dose)  5. Hyperlipidemia, unspecified hyperlipidemia type  - atorvastatin (LIPITOR) 20 MG tablet; Take 1 tablet (20 mg total) by mouth daily at 6 PM.  Dispense: 90 tablet; Refill: 3  6. Need for prophylactic vaccination against Streptococcus pneumoniae (pneumococcus)  - Pneumococcal conjugate vaccine 13-valent IM  Dorothyann Peng, NP

## 2016-06-23 NOTE — Telephone Encounter (Signed)
Rx has been sent  

## 2016-06-23 NOTE — Patient Instructions (Signed)
It was great seeing you again!  I have added Metformin to your diabetes regimen. Take 1.2 tablet twice a day   I have also increased the Glipizide to 10 mg twice a day   Follow up with me in three months for a recheck.   If you need anything before that please let me know  Health Maintenance, Male A healthy lifestyle and preventative care can promote health and wellness.  Maintain regular health, dental, and eye exams.  Eat a healthy diet. Foods like vegetables, fruits, whole grains, low-fat dairy products, and lean protein foods contain the nutrients you need and are low in calories. Decrease your intake of foods high in solid fats, added sugars, and salt. Get information about a proper diet from your health care provider, if necessary.  Regular physical exercise is one of the most important things you can do for your health. Most adults should get at least 150 minutes of moderate-intensity exercise (any activity that increases your heart rate and causes you to sweat) each week. In addition, most adults need muscle-strengthening exercises on 2 or more days a week.   Maintain a healthy weight. The body mass index (BMI) is a screening tool to identify possible weight problems. It provides an estimate of body fat based on height and weight. Your health care provider can find your BMI and can help you achieve or maintain a healthy weight. For males 20 years and older:  A BMI below 18.5 is considered underweight.  A BMI of 18.5 to 24.9 is normal.  A BMI of 25 to 29.9 is considered overweight.  A BMI of 30 and above is considered obese.  Maintain normal blood lipids and cholesterol by exercising and minimizing your intake of saturated fat. Eat a balanced diet with plenty of fruits and vegetables. Blood tests for lipids and cholesterol should begin at age 48 and be repeated every 5 years. If your lipid or cholesterol levels are high, you are over age 71, or you are at high risk for heart  disease, you may need your cholesterol levels checked more frequently.Ongoing high lipid and cholesterol levels should be treated with medicines if diet and exercise are not working.  If you smoke, find out from your health care provider how to quit. If you do not use tobacco, do not start.  Lung cancer screening is recommended for adults aged 74-80 years who are at high risk for developing lung cancer because of a history of smoking. A yearly low-dose CT scan of the lungs is recommended for people who have at least a 30-pack-year history of smoking and are current smokers or have quit within the past 15 years. A pack year of smoking is smoking an average of 1 pack of cigarettes a day for 1 year (for example, a 30-pack-year history of smoking could mean smoking 1 pack a day for 30 years or 2 packs a day for 15 years). Yearly screening should continue until the smoker has stopped smoking for at least 15 years. Yearly screening should be stopped for people who develop a health problem that would prevent them from having lung cancer treatment.  If you choose to drink alcohol, do not have more than 2 drinks per day. One drink is considered to be 12 oz (360 mL) of beer, 5 oz (150 mL) of wine, or 1.5 oz (45 mL) of liquor.  Avoid the use of street drugs. Do not share needles with anyone. Ask for help if you need support  or instructions about stopping the use of drugs.  High blood pressure causes heart disease and increases the risk of stroke. High blood pressure is more likely to develop in:  People who have blood pressure in the end of the normal range (100-139/85-89 mm Hg).  People who are overweight or obese.  People who are African American.  If you are 23-22 years of age, have your blood pressure checked every 3-5 years. If you are 10 years of age or older, have your blood pressure checked every year. You should have your blood pressure measured twice--once when you are at a hospital or clinic, and  once when you are not at a hospital or clinic. Record the average of the two measurements. To check your blood pressure when you are not at a hospital or clinic, you can use:  An automated blood pressure machine at a pharmacy.  A home blood pressure monitor.  If you are 28-45 years old, ask your health care provider if you should take aspirin to prevent heart disease.  Diabetes screening involves taking a blood sample to check your fasting blood sugar level. This should be done once every 3 years after age 66 if you are at a normal weight and without risk factors for diabetes. Testing should be considered at a younger age or be carried out more frequently if you are overweight and have at least 1 risk factor for diabetes.  Colorectal cancer can be detected and often prevented. Most routine colorectal cancer screening begins at the age of 12 and continues through age 31. However, your health care provider may recommend screening at an earlier age if you have risk factors for colon cancer. On a yearly basis, your health care provider may provide home test kits to check for hidden blood in the stool. A small camera at the end of a tube may be used to directly examine the colon (sigmoidoscopy or colonoscopy) to detect the earliest forms of colorectal cancer. Talk to your health care provider about this at age 69 when routine screening begins. A direct exam of the colon should be repeated every 5-10 years through age 3, unless early forms of precancerous polyps or small growths are found.  People who are at an increased risk for hepatitis B should be screened for this virus. You are considered at high risk for hepatitis B if:  You were born in a country where hepatitis B occurs often. Talk with your health care provider about which countries are considered high risk.  Your parents were born in a high-risk country and you have not received a shot to protect against hepatitis B (hepatitis B  vaccine).  You have HIV or AIDS.  You use needles to inject street drugs.  You live with, or have sex with, someone who has hepatitis B.  You are a man who has sex with other men (MSM).  You get hemodialysis treatment.  You take certain medicines for conditions like cancer, organ transplantation, and autoimmune conditions.  Hepatitis C blood testing is recommended for all people born from [birth year] through 1964 and any individual with known risk factors for hepatitis C.  Healthy men should no longer receive prostate-specific antigen (PSA) blood tests as part of routine cancer screening. Talk to your health care provider about prostate cancer screening.  Testicular cancer screening is not recommended for adolescents or adult males who have no symptoms. Screening includes self-exam, a health care provider exam, and other screening tests. Consult with your health  care provider about any symptoms you have or any concerns you have about testicular cancer.  Practice safe sex. Use condoms and avoid high-risk sexual practices to reduce the spread of sexually transmitted infections (STIs).  You should be screened for STIs, including gonorrhea and chlamydia if:  You are sexually active and are younger than 24 years.  You are older than 24 years, and your health care provider tells you that you are at risk for this type of infection.  Your sexual activity has changed since you were last screened, and you are at an increased risk for chlamydia or gonorrhea. Ask your health care provider if you are at risk.  If you are at risk of being infected with HIV, it is recommended that you take a prescription medicine daily to prevent HIV infection. This is called pre-exposure prophylaxis (PrEP). You are considered at risk if:  You are a man who has sex with other men (MSM).  You are a heterosexual man who is sexually active with multiple partners.  You take drugs by injection.  You are sexually active  with a partner who has HIV.  Talk with your health care provider about whether you are at high risk of being infected with HIV. If you choose to begin PrEP, you should first be tested for HIV. You should then be tested every 3 months for as long as you are taking PrEP.  Use sunscreen. Apply sunscreen liberally and repeatedly throughout the day. You should seek shade when your shadow is shorter than you. Protect yourself by wearing long sleeves, pants, a wide-brimmed hat, and sunglasses year round whenever you are outdoors.  Tell your health care provider of new moles or changes in moles, especially if there is a change in shape or color. Also, tell your health care provider if a mole is larger than the size of a pencil eraser.  A one-time screening for abdominal aortic aneurysm (AAA) and surgical repair of large AAAs by ultrasound is recommended for men aged 88-75 years who are current or former smokers.  Stay current with your vaccines (immunizations).   This information is not intended to replace advice given to you by your health care provider. Make sure you discuss any questions you have with your health care provider.   Document Released: 03/04/2008 Document Revised: 09/27/2014 Document Reviewed: 02/01/2011 Elsevier Interactive Patient Education [employer].

## 2016-06-23 NOTE — Telephone Encounter (Signed)
Pt need new Rx for lisinopril  Pharm:  [facility]

## 2016-06-26 LAB — CUP PACEART REMOTE DEVICE CHECK: Date Time Interrogation Session: 20170911220715

## 2016-06-26 NOTE — Progress Notes (Signed)
Carelink summary report received. Battery status OK. Normal device function. No new symptom episodes, tachy episodes, brady, or pause episodes. No new AF episodes. Monthly summary reports and ROV/PRN 

## 2016-06-27 ENCOUNTER — Other Ambulatory Visit: Payer: Self-pay | Admitting: Adult Health

## 2016-06-30 ENCOUNTER — Ambulatory Visit (INDEPENDENT_AMBULATORY_CARE_PROVIDER_SITE_OTHER): Payer: BC Managed Care – PPO | Admitting: *Deleted

## 2016-06-30 DIAGNOSIS — I639 Cerebral infarction, unspecified: Secondary | ICD-10-CM | POA: Diagnosis not present

## 2016-07-01 NOTE — Progress Notes (Signed)
Carelink Summary Repot / Loop Recorder

## 2016-07-05 ENCOUNTER — Telehealth: Payer: Self-pay | Admitting: Adult Health

## 2016-07-05 NOTE — Telephone Encounter (Signed)
Would hold his Metformin today and route this note to [facility] for further intervention,.

## 2016-07-05 NOTE — Telephone Encounter (Signed)
I called the pt and informed him of the message below.  Patient stated he actually stopped taking this on Saturday since the symptoms started the earlier part of last week.  Stated he noticed a tingling sensation in his tongue also and used Benadryl, cortisone cream and this helped.  Message forwarded to [facility].

## 2016-07-05 NOTE — Telephone Encounter (Signed)
Pt state he is having a allergic reaction to the Metformin (Rash on neck,back shoulder, arm and legs).

## 2016-07-06 ENCOUNTER — Other Ambulatory Visit: Payer: Self-pay | Admitting: Adult Health

## 2016-07-06 NOTE — Telephone Encounter (Signed)
Patient states that he is feeling much better, and is having no symptoms since he has not been taking the metformin.  Patient notified of Cory's comments and verbalized understanding.

## 2016-07-06 NOTE — Telephone Encounter (Signed)
Please see how Mr. Tedford is doing. It is ok to have him stop Metformin. Please have him watch his diet carefully and we will continue to monitor his A1c

## 2016-07-30 ENCOUNTER — Ambulatory Visit (INDEPENDENT_AMBULATORY_CARE_PROVIDER_SITE_OTHER): Payer: BC Managed Care – PPO | Admitting: *Deleted

## 2016-07-30 DIAGNOSIS — I639 Cerebral infarction, unspecified: Secondary | ICD-10-CM

## 2016-08-01 LAB — CUP PACEART REMOTE DEVICE CHECK
Date Time Interrogation Session: 20171011234043
Implantable Pulse Generator Implant Date: 20160817

## 2016-08-01 NOTE — Progress Notes (Signed)
Carelink summary report received. Battery status OK. Normal device function. No new symptom episodes, brady, or pause episodes. No new AF episodes. 1 tachy episode, short run SVT. Monthly summary reports and ROV/PRN 

## 2016-08-02 NOTE — Progress Notes (Signed)
Carelink Summary Report / Loop Recorder 

## 2016-08-16 ENCOUNTER — Encounter: Payer: Self-pay | Admitting: *Deleted

## 2016-08-17 ENCOUNTER — Encounter: Payer: Self-pay | Admitting: Cardiovascular Disease

## 2016-08-17 ENCOUNTER — Ambulatory Visit (INDEPENDENT_AMBULATORY_CARE_PROVIDER_SITE_OTHER): Payer: Medicare Other | Admitting: Cardiovascular Disease

## 2016-08-17 VITALS — BP 146/84 | HR 70 | Ht 71.0 in | Wt 245.1 lb

## 2016-08-17 DIAGNOSIS — E785 Hyperlipidemia, unspecified: Secondary | ICD-10-CM | POA: Diagnosis not present

## 2016-08-17 DIAGNOSIS — I10 Essential (primary) hypertension: Secondary | ICD-10-CM

## 2016-08-17 DIAGNOSIS — E669 Obesity, unspecified: Secondary | ICD-10-CM

## 2016-08-17 DIAGNOSIS — Z4509 Encounter for adjustment and management of other cardiac device: Secondary | ICD-10-CM | POA: Diagnosis not present

## 2016-08-17 DIAGNOSIS — E668 Other obesity: Secondary | ICD-10-CM

## 2016-08-17 DIAGNOSIS — I639 Cerebral infarction, unspecified: Secondary | ICD-10-CM | POA: Diagnosis not present

## 2016-08-17 DIAGNOSIS — E1159 Type 2 diabetes mellitus with other circulatory complications: Secondary | ICD-10-CM

## 2016-08-17 MED ORDER — ATORVASTATIN CALCIUM 20 MG PO TABS: 20.0000 mg | ORAL_TABLET | Freq: Every day | ORAL | 3 refills | Status: DC

## 2016-08-17 MED ORDER — LISINOPRIL 20 MG PO TABS: 20.0000 mg | ORAL_TABLET | Freq: Every day | ORAL | 3 refills | Status: DC

## 2016-08-17 NOTE — Patient Instructions (Addendum)
Dr Sallyanne Kuster has recommended making the following medication changes: 1. INCREASE Lisinopril to 20 mg daily  Your physician has requested that you regularly monitor and record your blood pressure readings at home. Please use the same machine at the same time of day to check your readings and record them to bring to your follow-up visit. IN TWO WEEKS please send [place] your blood pressure readings either via mychart or by calling the office to speak with a nurse.  Dr Sallyanne Kuster recommends that you schedule a follow-up appointment in 1 year. You will receive a reminder letter in the mail two months in advance. If you don't receive a letter, please call our office to schedule the follow-up appointment.  If you need a refill on your cardiac medications before your next appointment, please call your pharmacy.

## 2016-08-17 NOTE — Progress Notes (Signed)
Cardiology Office Note    Date:  08/17/2016   ID:  Trevor Wheeler, Trevor Wheeler Sep 19, 1946, MRN [id number]  PCP:  Dorothyann Peng, NP  Cardiologist:   Sanda Klein, MD   Chief Complaint  Patient presents with  . Follow-up    no chest pain, no shortness of breath, no edema, no pain or cramping in legs, no lightheaded or dizziness    History of Present Illness:  Trevor Wheeler is a 70 y.o. male with cryptogenic stroke, hypertension and hyperlipidemia returning for follow-up.   An implantable loop recorder was placed when he had the left cerebellar stroke in August 2016 but has not recorded atrial fibrillation. A few episodes of brief paroxysmal atrial tachycardia have been recorded. Most recently September 22 when the episode lasted for only 5 seconds. He is enrolled in theRESPECT ESUS clinical trial of Pradaxa versus placebo.   He was taking a statin but ran out of the prescription several weeks ago. His blood pressures a little elevated today and reports that this has been the case now for a few weeks. He walks 4 miles a day every day. He is moderately obese. He denies angina, dyspnea, palpitations, syncope, new neurological complaints and believes that his glycemic control is good. He does not have leg edema or intermittent claudication. He has not had any bleeding problems.   Past Medical History:  Diagnosis Date  . CVA (cerebral vascular accident) ([place]) 05/04/2015   L cerebellar, "only problem I have is w/balance" 05/05/2015)  . Hypertension   . Type II diabetes mellitus ([place]) dx'd 05/05/2015    Past Surgical History:  Procedure Laterality Date  . EP IMPLANTABLE DEVICE N/A 05/07/2015   Procedure: Loop Recorder Insertion;  Surgeon: Sanda Klein, MD;  Location: [facility];  Service: Cardiovascular;  Laterality: N/A;  . NO PAST SURGERIES    . TEE WITHOUT CARDIOVERSION N/A 05/07/2015   Procedure: TRANSESOPHAGEAL ECHOCARDIOGRAM (TEE);  Surgeon: Skeet Latch, MD;   Location: [facility];  Service: Cardiovascular;  Laterality: N/A;    Current Medications: Outpatient Medications Prior to Visit  Medication Sig Dispense Refill  . dabigatran (PRADAXA) 150 MG CAPS capsule Take 150 mg by mouth 2 (two) times daily. CLINICAL TRIAL THAT CAN LAST UP TO 3 YEARS    . glipiZIDE (GLUCOTROL) 10 MG tablet Take 1 tablet (10 mg total) by mouth 2 (two) times daily before a meal. Take before evening meal 180 tablet 3  . glucose blood test strip Use as instructed 100 each 12  . NON FORMULARY Acetylsalicylic acid tablets 123XX123 mg or placebo 1 time a day   FOR CLINICAL TRIAL    . atorvastatin (LIPITOR) 20 MG tablet Take 1 tablet (20 mg total) by mouth daily at 6 PM. 90 tablet 3  . lisinopril (PRINIVIL,ZESTRIL) 10 MG tablet TAKE 1 TABLET (10 MG TOTAL) BY MOUTH DAILY. 90 tablet 1   No facility-administered medications prior to visit.      Allergies:   Penicillins and Metformin and related   Social History   Social History  . Marital status: Married    Spouse name: N/A  . Number of children: N/A  . Years of education: N/A   Social History Main Topics  . Smoking status: Former Smoker    Packs/day: 1.00    Years: 20.00    Types: Cigarettes    Quit date: 05/21/1974  . Smokeless tobacco: Never Used     Comment: quit smoking in the 1970's  . Alcohol use  No  . Drug use: No  . Sexual activity: Not Currently   Other Topics Concern  . None   Social History Narrative   Employed as a Therapist, music.    Married for 30 years   Has two daughters who live locally.         Family History:  The patient's family history includes Colon cancer in his brother; Heart disease in his brother and mother.   ROS:   Please see the history of present illness.    ROS All other systems reviewed and are negative.   PHYSICAL EXAM:   VS:  BP (!) 146/84 (BP Location: Right Arm, Patient Position: Sitting, Cuff Size: Large)   Pulse 70   Ht 5\' 11"  (1.803 m)   Wt 245 lb 2 oz  (111.2 kg)   BMI 34.19 kg/m    GEN: Well nourished, well developed, in no acute distress  HEENT: normal  Neck: no JVD, carotid bruits, or masses Cardiac: Well-healed implantable loop recorder site. RRR; no murmurs, rubs, or gallops,no edema  Respiratory:  clear to auscultation bilaterally, normal work of breathing GI: soft, nontender, nondistended, + BS MS: no deformity or atrophy  Skin: warm and dry, no rash Neuro:  Alert and Oriented x 3, Strength and sensation are intact Psych: euthymic mood, full affect  Wt Readings from Last 3 Encounters:  08/17/16 245 lb 2 oz (111.2 kg)  06/23/16 239 lb 12.8 oz (108.8 kg)  02/03/16 240 lb 12.8 oz (109.2 kg)      Studies/Labs Reviewed:   EKG:  EKG is ordered today.  The ekg ordered today demonstrates Normal sinus rhythm with left axis deviation, no ischemic changes.  Recent Labs: 06/18/2016: ALT 18; BUN 10; Creatinine, Ser 0.89; Hemoglobin 13.5; Platelets 197.0; Potassium 4.3; Sodium 141; TSH 0.75   Lipid Panel    Component Value Date/Time   CHOL 133 06/18/2016 0755   TRIG 127.0 06/18/2016 0755   HDL 31.90 (L) 06/18/2016 0755   CHOLHDL 4 06/18/2016 0755   VLDL 25.4 06/18/2016 0755   LDLCALC 76 06/18/2016 0755   LDLDIRECT 182.2 08/05/2010 0838    ASSESSMENT:    1. Cryptogenic stroke ([place])   2. Encounter for loop recorder check   3. Essential hypertension   4. Hyperlipidemia, unspecified hyperlipidemia type   5. Controlled type 2 diabetes mellitus with other circulatory complication, without long-term current use of insulin ([place])   6. Moderate obesity      PLAN:  In order of problems listed above:  1. History of cryptogenic stroke: Enrolled in double blind trial of aspirin versus dabigatran 2. ILR: Normal device function. No arrhythmia that could explain the stroke, but occasional paroxysmal atrial tachycardia 3. HTN: Recently consistently elevated blood pressure. Increase dose of lisinopril. Call back with blood pressure  recordings after couple of weeks. 4. HLP: Resume statin. Recheck lipid profile in 3 months. 5. DM: Well controlled per his report. Do not have recent labs. He has actually lost about 16 pounds since his last appointment. He is exercising a good amount on a daily basis. Discussed reduce intake of carbohydrates, especially reduce intake of sweets and starches with high glycemic index. 6. Obesity: Encouraged to continue his efforts at weight loss    Medication Adjustments/Labs and Tests Ordered: Current medicines are reviewed at length with the patient today.  Concerns regarding medicines are outlined above.  Medication changes, Labs and Tests ordered today are listed in the Patient Instructions below. Patient Instructions  Dr Sallyanne Kuster has  recommended making the following medication changes: 1. INCREASE Lisinopril to 20 mg daily  Your physician has requested that you regularly monitor and record your blood pressure readings at home. Please use the same machine at the same time of day to check your readings and record them to bring to your follow-up visit. IN TWO WEEKS please send [place] your blood pressure readings either via mychart or by calling the office to speak with a nurse.  Dr Sallyanne Kuster recommends that you schedule a follow-up appointment in 1 year. You will receive a reminder letter in the mail two months in advance. If you don't receive a letter, please call our office to schedule the follow-up appointment.  If you need a refill on your cardiac medications before your next appointment, please call your pharmacy.    Signed, Sanda Klein, MD  08/17/2016 9:26 PM    [address], [place], [place]  [address] Phone: [phone]; Fax: [phone]

## 2016-08-27 LAB — CUP PACEART INCLINIC DEVICE CHECK
Date Time Interrogation Session: 20171128160237
Implantable Pulse Generator Implant Date: 20160817

## 2016-08-30 ENCOUNTER — Ambulatory Visit (INDEPENDENT_AMBULATORY_CARE_PROVIDER_SITE_OTHER): Payer: Medicare Other | Admitting: *Deleted

## 2016-08-30 DIAGNOSIS — I639 Cerebral infarction, unspecified: Secondary | ICD-10-CM

## 2016-08-30 NOTE — Progress Notes (Signed)
Carelink Summary Report / Loop Recorder 

## 2016-09-15 LAB — CUP PACEART REMOTE DEVICE CHECK
Date Time Interrogation Session: 20171111004115
MDC IDC PG IMPLANT DT: 20160817

## 2016-09-20 HISTORY — PX: OTHER SURGICAL HISTORY: SHX169

## 2016-09-23 ENCOUNTER — Encounter: Payer: Self-pay | Admitting: Adult Health

## 2016-09-23 ENCOUNTER — Ambulatory Visit (INDEPENDENT_AMBULATORY_CARE_PROVIDER_SITE_OTHER): Payer: Medicare Other | Admitting: Adult Health

## 2016-09-23 VITALS — BP 164/84 | Temp 98.2°F | Ht 71.0 in | Wt 243.8 lb

## 2016-09-23 DIAGNOSIS — E1159 Type 2 diabetes mellitus with other circulatory complications: Secondary | ICD-10-CM | POA: Diagnosis not present

## 2016-09-23 DIAGNOSIS — I10 Essential (primary) hypertension: Secondary | ICD-10-CM | POA: Diagnosis not present

## 2016-09-23 LAB — POCT GLYCOSYLATED HEMOGLOBIN (HGB A1C): HEMOGLOBIN A1C: 6.2

## 2016-09-23 NOTE — Progress Notes (Signed)
Subjective:    Patient ID: Trevor Wheeler, male    DOB: 11/25/1945, 71 y.o.   MRN: [id number]  HPI  71 year old male who presents to the office today for three month follow up regarding diabetes. During the last visit we added metformin 250 mg to his regimen. Soon after staring this medication he had a developed an allergic reaction ( rash and a tingling sensation on his tongue.) He was asked to stop Metformin but continue glipizide. He stopped Metformin until his rash resolved and then he started it again. He has not had any allergic symptoms since and he reports doing well on this medication   Per his home meter his blood sugars have been between 70-220.   His blood pressures at home have been between 115-140. Cardiology increased lisinopril to 20 mg in November   Review of Systems  Constitutional: Negative.   Respiratory: Negative.   Cardiovascular: Negative.   Gastrointestinal: Negative.   Genitourinary: Negative.   Neurological: Negative.   All other systems reviewed and are negative.  Past Medical History:  Diagnosis Date  . CVA (cerebral vascular accident) ([place]) 05/04/2015   L cerebellar, "only problem I have is w/balance" 05/05/2015)  . Hypertension   . Type II diabetes mellitus ([place]) dx'd 05/05/2015    Social History   Social History  . Marital status: Married    Spouse name: N/A  . Number of children: N/A  . Years of education: N/A   Occupational History  . Not on file.   Social History Main Topics  . Smoking status: Former Smoker    Packs/day: 1.00    Years: 20.00    Types: Cigarettes    Quit date: 05/21/1974  . Smokeless tobacco: Never Used     Comment: quit smoking in the 1970's  . Alcohol use No  . Drug use: No  . Sexual activity: Not Currently   Other Topics Concern  . Not on file   Social History Narrative   Employed as a Therapist, music.    Married for 30 years   Has two daughters who live locally.        Past Surgical History:   Procedure Laterality Date  . EP IMPLANTABLE DEVICE N/A 05/07/2015   Procedure: Loop Recorder Insertion;  Surgeon: Sanda Klein, MD;  Location: [facility];  Service: Cardiovascular;  Laterality: N/A;  . NO PAST SURGERIES    . TEE WITHOUT CARDIOVERSION N/A 05/07/2015   Procedure: TRANSESOPHAGEAL ECHOCARDIOGRAM (TEE);  Surgeon: Skeet Latch, MD;  Location: [facility] ENDOSCOPY;  Service: Cardiovascular;  Laterality: N/A;    Family History  Problem Relation Age of Onset  . Heart disease Mother   . Colon cancer Brother   . Heart disease Brother     Allergies  Allergen Reactions  . Penicillins Hives  . Metformin And Related Rash    Current Outpatient Prescriptions on File Prior to Visit  Medication Sig Dispense Refill  . atorvastatin (LIPITOR) 20 MG tablet Take 1 tablet (20 mg total) by mouth daily at 6 PM. 90 tablet 3  . dabigatran (PRADAXA) 150 MG CAPS capsule Take 150 mg by mouth 2 (two) times daily. CLINICAL TRIAL THAT CAN LAST UP TO 3 YEARS    . glipiZIDE (GLUCOTROL) 10 MG tablet Take 1 tablet (10 mg total) by mouth 2 (two) times daily before a meal. Take before evening meal 180 tablet 3  . glucose blood test strip Use as instructed 100 each 12  . lisinopril (  PRINIVIL,ZESTRIL) 20 MG tablet Take 1 tablet (20 mg total) by mouth daily. 90 tablet 3  . metFORMIN (GLUCOPHAGE) 500 MG tablet Take 0.5 tablets by mouth 2 (two) times daily.  1  . NON FORMULARY Acetylsalicylic acid tablets 123XX123 mg or placebo 1 time a day   FOR CLINICAL TRIAL     No current facility-administered medications on file prior to visit.     BP (!) 164/84   Temp 98.2 F (36.8 C) (Oral)   Ht 5\' 11"  (1.803 m)   Wt 243 lb 12.8 oz (110.6 kg)   BMI 34.00 kg/m       Objective:   Physical Exam  Constitutional: He is oriented to person, place, and time. He appears well-developed and well-nourished. No distress.  Cardiovascular: Normal rate, regular rhythm, normal heart sounds and intact distal pulses.  Exam  reveals no gallop and no friction rub.   No murmur heard. Pulmonary/Chest: Effort normal and breath sounds normal. No respiratory distress. He has no wheezes. He has no rales. He exhibits no tenderness.  Neurological: He is alert and oriented to person, place, and time.  Skin: Skin is warm and dry. No rash noted. No erythema. No pallor.  Psychiatric: He has a normal mood and affect. His behavior is normal. Judgment and thought content normal.  Nursing note and vitals reviewed.     Assessment & Plan:  1. Controlled type 2 diabetes mellitus with other circulatory complication, without long-term current use of insulin (HCC) - POC HgB A1c- 6.2. Down from 6.8.  - Continue with current therapy  - Work on diet and exercise - Follow up in 6 months   2. Essential hypertension - Appears near or at goal at home.  - Continue with current medication - Follow up with Cardiology as directed  Dorothyann Peng, NP

## 2016-09-28 ENCOUNTER — Ambulatory Visit (INDEPENDENT_AMBULATORY_CARE_PROVIDER_SITE_OTHER): Payer: Medicare Other | Admitting: *Deleted

## 2016-09-28 DIAGNOSIS — I639 Cerebral infarction, unspecified: Secondary | ICD-10-CM | POA: Diagnosis not present

## 2016-09-29 NOTE — Progress Notes (Signed)
Carelink Summary Report / Loop Recorder 

## 2016-10-21 LAB — CUP PACEART REMOTE DEVICE CHECK
Date Time Interrogation Session: 20171211004136
MDC IDC PG IMPLANT DT: 20160817

## 2016-10-21 NOTE — Progress Notes (Signed)
Carelink summary report received. Battery status OK. Normal device function. No new symptom episodes, tachy episodes, brady, or pause episodes. No new AF episodes. Monthly summary reports and ROV/PRN 

## 2016-10-28 ENCOUNTER — Ambulatory Visit (INDEPENDENT_AMBULATORY_CARE_PROVIDER_SITE_OTHER): Payer: Medicare Other | Admitting: *Deleted

## 2016-10-28 DIAGNOSIS — I639 Cerebral infarction, unspecified: Secondary | ICD-10-CM | POA: Diagnosis not present

## 2016-10-29 NOTE — Progress Notes (Signed)
Carelink Summary Report / Loop Recorder 

## 2016-11-05 ENCOUNTER — Other Ambulatory Visit: Payer: Self-pay | Admitting: Cardiovascular Disease

## 2016-11-10 LAB — CUP PACEART REMOTE DEVICE CHECK
Implantable Pulse Generator Implant Date: 20160817
MDC IDC SESS DTM: 20180110010654

## 2016-11-21 LAB — CUP PACEART REMOTE DEVICE CHECK
Date Time Interrogation Session: 20180209010904
Implantable Pulse Generator Implant Date: 20160817

## 2016-11-21 NOTE — Progress Notes (Signed)
Carelink summary report received. Battery status OK. Normal device function. No new symptom episodes, tachy episodes, brady, or pause episodes. No new AF episodes. Monthly summary reports and ROV/PRN 

## 2016-11-29 ENCOUNTER — Ambulatory Visit (INDEPENDENT_AMBULATORY_CARE_PROVIDER_SITE_OTHER): Payer: Medicare Other | Admitting: *Deleted

## 2016-11-29 DIAGNOSIS — I639 Cerebral infarction, unspecified: Secondary | ICD-10-CM | POA: Diagnosis not present

## 2016-11-30 NOTE — Progress Notes (Signed)
Carelink Summary Report / Loop Recorder 

## 2016-12-04 LAB — CUP PACEART REMOTE DEVICE CHECK
Date Time Interrogation Session: 20180311024321
MDC IDC PG IMPLANT DT: 20160817

## 2016-12-04 NOTE — Progress Notes (Signed)
Carelink summary report received. Battery status OK. Normal device function. No new symptom episodes, tachy episodes, brady, or pause episodes. No new AF episodes. Monthly summary reports and ROV/PRN 

## 2016-12-13 ENCOUNTER — Other Ambulatory Visit: Payer: Self-pay | Admitting: Adult Health

## 2016-12-13 DIAGNOSIS — IMO0001 Reserved for inherently not codable concepts without codable children: Secondary | ICD-10-CM

## 2016-12-13 DIAGNOSIS — E1165 Type 2 diabetes mellitus with hyperglycemia: Principal | ICD-10-CM

## 2016-12-13 NOTE — Telephone Encounter (Signed)
It looks like you discontinued this due to an allergic reaction. Please advise on refill. Thanks!

## 2016-12-13 NOTE — Telephone Encounter (Signed)
Ok to refill for one year. He had restarted metformin without any issues

## 2016-12-27 ENCOUNTER — Ambulatory Visit (INDEPENDENT_AMBULATORY_CARE_PROVIDER_SITE_OTHER): Payer: Medicare Other | Admitting: *Deleted

## 2016-12-27 DIAGNOSIS — I639 Cerebral infarction, unspecified: Secondary | ICD-10-CM | POA: Diagnosis not present

## 2016-12-28 NOTE — Progress Notes (Signed)
Carelink Summary Report / Loop Recorder 

## 2017-01-07 LAB — CUP PACEART REMOTE DEVICE CHECK
Date Time Interrogation Session: 20180410033806
MDC IDC PG IMPLANT DT: 20160817

## 2017-01-26 ENCOUNTER — Ambulatory Visit (INDEPENDENT_AMBULATORY_CARE_PROVIDER_SITE_OTHER): Payer: Medicare Other | Admitting: *Deleted

## 2017-01-26 DIAGNOSIS — I639 Cerebral infarction, unspecified: Secondary | ICD-10-CM

## 2017-01-27 NOTE — Progress Notes (Signed)
Carelink Summary Report / Loop Recorder 

## 2017-01-30 ENCOUNTER — Other Ambulatory Visit: Payer: Self-pay | Admitting: Adult Health

## 2017-01-30 DIAGNOSIS — E1159 Type 2 diabetes mellitus with other circulatory complications: Secondary | ICD-10-CM

## 2017-02-04 LAB — CUP PACEART REMOTE DEVICE CHECK
Implantable Pulse Generator Implant Date: 20160817
MDC IDC SESS DTM: 20180510171248

## 2017-02-22 ENCOUNTER — Encounter: Payer: Self-pay | Admitting: Adult Health

## 2017-02-22 ENCOUNTER — Ambulatory Visit (INDEPENDENT_AMBULATORY_CARE_PROVIDER_SITE_OTHER): Payer: Medicare Other | Admitting: Adult Health

## 2017-02-22 VITALS — BP 132/70 | Temp 98.1°F | Ht 71.0 in | Wt 243.8 lb

## 2017-02-22 DIAGNOSIS — E1165 Type 2 diabetes mellitus with hyperglycemia: Secondary | ICD-10-CM | POA: Diagnosis not present

## 2017-02-22 DIAGNOSIS — IMO0001 Reserved for inherently not codable concepts without codable children: Secondary | ICD-10-CM

## 2017-02-22 LAB — POCT GLYCOSYLATED HEMOGLOBIN (HGB A1C): Hemoglobin A1C: 6.7

## 2017-02-22 NOTE — Progress Notes (Signed)
Subjective:    Patient ID: Trevor Wheeler, male    DOB: 18-May-1946, 71 y.o.   MRN: [id number]  HPI  71 year old male who  has a past medical history of CVA (cerebral vascular accident) ([place]) (05/04/2015); Hypertension; and Type II diabetes mellitus ([place]) (dx'd 05/05/2015).  He presents to the office today for follow up regarding diabetes. I last saw him 6 months ago at which time his A1c was 6.2. He is currently taking Metformin 250 mg BID and Glipizide 10 mg BID. He reports that his blood sugars are high in the morning and then level out in the afternoon. He forgot his log for todays visit.   He denies any acute complaints today   Review of Systems  Constitutional: Negative.   Respiratory: Negative.   Cardiovascular: Negative.   Genitourinary: Negative.   Neurological: Negative.    See HPI   Past Medical History:  Diagnosis Date  . CVA (cerebral vascular accident) ([place]) 05/04/2015   L cerebellar, "only problem I have is w/balance" 05/05/2015)  . Hypertension   . Type II diabetes mellitus ([place]) dx'd 05/05/2015    Social History   Social History  . Marital status: Married    Spouse name: N/A  . Number of children: N/A  . Years of education: N/A   Occupational History  . Not on file.   Social History Main Topics  . Smoking status: Former Smoker    Packs/day: 1.00    Years: 20.00    Types: Cigarettes    Quit date: 05/21/1974  . Smokeless tobacco: Never Used     Comment: quit smoking in the 1970's  . Alcohol use No  . Drug use: No  . Sexual activity: Not Currently   Other Topics Concern  . Not on file   Social History Narrative   Employed as a Therapist, music.    Married for 30 years   Has two daughters who live locally.        Past Surgical History:  Procedure Laterality Date  . EP IMPLANTABLE DEVICE N/A 05/07/2015   Procedure: Loop Recorder Insertion;  Surgeon: Sanda Klein, MD;  Location: [facility];  Service: Cardiovascular;   Laterality: N/A;  . NO PAST SURGERIES    . TEE WITHOUT CARDIOVERSION N/A 05/07/2015   Procedure: TRANSESOPHAGEAL ECHOCARDIOGRAM (TEE);  Surgeon: Skeet Latch, MD;  Location: [facility];  Service: Cardiovascular;  Laterality: N/A;    Family History  Problem Relation Age of Onset  . Heart disease Mother   . Colon cancer Brother   . Heart disease Brother     Allergies  Allergen Reactions  . Penicillins Hives    Current Outpatient Prescriptions on File Prior to Visit  Medication Sig Dispense Refill  . atorvastatin (LIPITOR) 20 MG tablet Take 1 tablet (20 mg total) by mouth daily at 6 PM. 90 tablet 3  . dabigatran (PRADAXA) 150 MG CAPS capsule Take 150 mg by mouth 2 (two) times daily. CLINICAL TRIAL THAT CAN LAST UP TO 3 YEARS    . glipiZIDE (GLUCOTROL) 10 MG tablet Take 1 tablet (10 mg total) by mouth 2 (two) times daily before a meal. Take before evening meal 180 tablet 3  . glucose blood (ONETOUCH VERIO) test strip Use as instructed to check blood sugar once a day 100 each 12  . lisinopril (PRINIVIL,ZESTRIL) 20 MG tablet Take 1 tablet (20 mg total) by mouth daily. 90 tablet 3  . metFORMIN (GLUCOPHAGE) 500 MG tablet Take 0.5  tablets by mouth 2 (two) times daily.  1  . NON FORMULARY Acetylsalicylic acid tablets 202 mg or placebo 1 time a day   FOR CLINICAL TRIAL     No current facility-administered medications on file prior to visit.     BP 132/70 (BP Location: Left Arm, Patient Position: Sitting, Cuff Size: Large)   Temp 98.1 F (36.7 C) (Oral)   Ht 5\' 11"  (1.803 m)   Wt 243 lb 12.8 oz (110.6 kg)   BMI 34.00 kg/m       Objective:   Physical Exam  Constitutional: He is oriented to person, place, and time. He appears well-developed and well-nourished. No distress.  Cardiovascular: Normal rate, regular rhythm, normal heart sounds and intact distal pulses.  Exam reveals no friction rub.   No murmur heard. Pulmonary/Chest: Effort normal and breath sounds normal. No  respiratory distress. He has no wheezes. He has no rales. He exhibits no tenderness.  Neurological: He is alert and oriented to person, place, and time.  Skin: Skin is warm and dry. No rash noted. He is not diaphoretic. No erythema. No pallor.  Psychiatric: He has a normal mood and affect. His behavior is normal. Judgment and thought content normal.  Nursing note and vitals reviewed.     Assessment & Plan:  1. Uncontrolled diabetes mellitus type 2 without complications, unspecified whether long term insulin use (HCC) - POC HgB A1c - Has increased slightly from 6.2 to 6.7  - No change in medication  - Will follow up with in 3 months at [place], NP

## 2017-02-28 ENCOUNTER — Ambulatory Visit (INDEPENDENT_AMBULATORY_CARE_PROVIDER_SITE_OTHER): Payer: Medicare Other | Admitting: *Deleted

## 2017-02-28 DIAGNOSIS — I639 Cerebral infarction, unspecified: Secondary | ICD-10-CM

## 2017-02-28 NOTE — Progress Notes (Signed)
Carelink Summary Report / Loop Recorder 

## 2017-03-09 LAB — CUP PACEART REMOTE DEVICE CHECK
Date Time Interrogation Session: 20180609054236
MDC IDC PG IMPLANT DT: 20160817

## 2017-03-09 NOTE — Progress Notes (Signed)
Carelink summary report received. Battery status OK. Normal device function. No new symptom episodes, tachy episodes, brady, or pause episodes. No new AF episodes. Monthly summary reports and ROV/PRN 

## 2017-03-16 ENCOUNTER — Other Ambulatory Visit: Payer: Self-pay | Admitting: Neurology

## 2017-03-16 MED ORDER — ASPIRIN EC 325 MG PO TBEC: 325.0000 mg | DELAYED_RELEASE_TABLET | Freq: Every day | ORAL | 0 refills | Status: DC

## 2017-03-16 MED ORDER — ASPIRIN EC 325 MG PO TBEC: 325.0000 mg | DELAYED_RELEASE_TABLET | Freq: Every day | ORAL | 0 refills | Status: AC

## 2017-03-16 NOTE — Progress Notes (Signed)
Pt came in for end of study visit. Pt has no complains. Loop recorder no afib. General and neuro exam normal. Instructed him to start ASA 325mg  EC daily. Continue statin. Follow up as needed in neuro clinic.   Rosalin Hawking, MD PhD Stroke Neurology 03/16/2017 10:03 AM

## 2017-03-28 ENCOUNTER — Ambulatory Visit (INDEPENDENT_AMBULATORY_CARE_PROVIDER_SITE_OTHER): Payer: Medicare Other | Admitting: *Deleted

## 2017-03-28 DIAGNOSIS — I639 Cerebral infarction, unspecified: Secondary | ICD-10-CM

## 2017-03-28 NOTE — Progress Notes (Signed)
Carelink Summary Report / Loop Recorder 

## 2017-04-09 LAB — CUP PACEART REMOTE DEVICE CHECK
MDC IDC PG IMPLANT DT: 20160817
MDC IDC SESS DTM: 20180709061034

## 2017-04-20 ENCOUNTER — Telehealth: Payer: Self-pay | Admitting: Cardiology

## 2017-04-20 NOTE — Telephone Encounter (Signed)
LMOVM requesting that pt send manual transmission b/c home monitor has not updated in at least 14 days.    

## 2017-04-27 ENCOUNTER — Ambulatory Visit (INDEPENDENT_AMBULATORY_CARE_PROVIDER_SITE_OTHER): Payer: Medicare Other | Admitting: *Deleted

## 2017-04-27 ENCOUNTER — Encounter: Payer: Self-pay | Admitting: Cardiology

## 2017-04-27 DIAGNOSIS — I639 Cerebral infarction, unspecified: Secondary | ICD-10-CM

## 2017-04-27 NOTE — Progress Notes (Signed)
Carelink Summary Report / Loop Recorder 

## 2017-05-05 LAB — CUP PACEART REMOTE DEVICE CHECK
Date Time Interrogation Session: 20180808063836
MDC IDC PG IMPLANT DT: 20160817

## 2017-05-11 ENCOUNTER — Encounter: Payer: Self-pay | Admitting: Cardiology

## 2017-05-27 ENCOUNTER — Encounter: Payer: Medicare Other | Admitting: *Deleted

## 2017-06-01 ENCOUNTER — Encounter: Payer: Self-pay | Admitting: Cardiology

## 2017-06-04 ENCOUNTER — Other Ambulatory Visit: Payer: Self-pay | Admitting: Adult Health

## 2017-06-04 DIAGNOSIS — IMO0001 Reserved for inherently not codable concepts without codable children: Secondary | ICD-10-CM

## 2017-06-04 DIAGNOSIS — E1165 Type 2 diabetes mellitus with hyperglycemia: Principal | ICD-10-CM

## 2017-06-06 NOTE — Telephone Encounter (Signed)
Sent to the pharmacy by e-scribe for 90 days.  Pt has upcoming cpx on 06/28/17.

## 2017-06-28 ENCOUNTER — Ambulatory Visit (INDEPENDENT_AMBULATORY_CARE_PROVIDER_SITE_OTHER): Payer: Medicare Other | Admitting: Adult Health

## 2017-06-28 ENCOUNTER — Encounter: Payer: Self-pay | Admitting: Adult Health

## 2017-06-28 ENCOUNTER — Encounter: Payer: Self-pay | Admitting: Family Medicine

## 2017-06-28 VITALS — BP 126/76 | Temp 98.4°F | Ht 70.0 in | Wt 241.0 lb

## 2017-06-28 DIAGNOSIS — I10 Essential (primary) hypertension: Secondary | ICD-10-CM

## 2017-06-28 DIAGNOSIS — Z0000 Encounter for general adult medical examination without abnormal findings: Secondary | ICD-10-CM

## 2017-06-28 DIAGNOSIS — Z125 Encounter for screening for malignant neoplasm of prostate: Secondary | ICD-10-CM | POA: Diagnosis not present

## 2017-06-28 DIAGNOSIS — E1159 Type 2 diabetes mellitus with other circulatory complications: Secondary | ICD-10-CM | POA: Diagnosis not present

## 2017-06-28 DIAGNOSIS — E782 Mixed hyperlipidemia: Secondary | ICD-10-CM

## 2017-06-28 DIAGNOSIS — Z1159 Encounter for screening for other viral diseases: Secondary | ICD-10-CM | POA: Diagnosis not present

## 2017-06-28 LAB — BASIC METABOLIC PANEL
BUN: 10 mg/dL (ref 6–23)
CO2: 26 meq/L (ref 19–32)
Calcium: 9.4 mg/dL (ref 8.4–10.5)
Chloride: 106 mEq/L (ref 96–112)
Creatinine, Ser: 0.91 mg/dL (ref 0.40–1.50)
GFR: 105.81 mL/min (ref >60.00)
Glucose, Bld: 139 mg/dL — ABNORMAL HIGH (ref 70–99)
POTASSIUM: 4.3 meq/L (ref 3.5–5.1)
Sodium: 141 mEq/L (ref 135–145)

## 2017-06-28 LAB — HEPATIC FUNCTION PANEL
ALBUMIN: 4.2 g/dL (ref 3.5–5.2)
ALT: 29 U/L (ref 0–53)
AST: 21 U/L (ref 0–37)
Alkaline Phosphatase: 91 U/L (ref 39–117)
Bilirubin, Direct: 0.1 mg/dL (ref 0.0–0.3)
TOTAL PROTEIN: 6.7 g/dL (ref 6.0–8.3)
Total Bilirubin: 0.8 mg/dL (ref 0.2–1.2)

## 2017-06-28 LAB — LIPID PANEL
Cholesterol: 127 mg/dL (ref 0–200)
HDL: 31.9 mg/dL — ABNORMAL LOW (ref >39.00)
LDL CALC: 73 mg/dL (ref 0–99)
NonHDL: 95.58
Total CHOL/HDL Ratio: 4
Triglycerides: 112 mg/dL (ref 0.0–149.0)
VLDL: 22.4 mg/dL (ref 0.0–40.0)

## 2017-06-28 LAB — CBC WITH DIFFERENTIAL/PLATELET
BASOS PCT: 1 % (ref 0.0–3.0)
Basophils Absolute: 0.1 10*3/uL (ref 0.0–0.1)
EOS PCT: 2.1 % (ref 0.0–5.0)
Eosinophils Absolute: 0.2 10*3/uL (ref 0.0–0.7)
HEMATOCRIT: 42.5 % (ref 39.0–52.0)
HEMOGLOBIN: 13.7 g/dL (ref 13.0–17.0)
LYMPHS PCT: 32.3 % (ref 12.0–46.0)
Lymphs Abs: 2.5 10*3/uL (ref 0.7–4.0)
MCHC: 32.1 g/dL (ref 30.0–36.0)
MCV: 84.8 fl (ref 78.0–100.0)
Monocytes Absolute: 0.6 10*3/uL (ref 0.1–1.0)
Monocytes Relative: 7.2 % (ref 3.0–12.0)
NEUTROS ABS: 4.5 10*3/uL (ref 1.4–7.7)
Neutrophils Relative %: 57.4 % (ref 43.0–77.0)
Platelets: 208 10*3/uL (ref 150.0–400.0)
RBC: 5.01 Mil/uL (ref 4.22–5.81)
RDW: 13.7 % (ref 11.5–15.5)
WBC: 7.8 10*3/uL (ref 4.0–10.5)

## 2017-06-28 LAB — PSA: PSA: 0.98 ng/mL (ref 0.10–4.00)

## 2017-06-28 LAB — HEMOGLOBIN A1C: Hgb A1c MFr Bld: 6.6 % — ABNORMAL HIGH (ref 4.6–6.5)

## 2017-06-28 LAB — TSH: TSH: 0.91 u[IU]/mL (ref 0.35–4.50)

## 2017-06-28 NOTE — Patient Instructions (Signed)
It was great seeing you today!   I will follow up with you regarding your blood work   Please continue to exercise and eat healthy, but more importantly... Keep smiling  I will see you in 6 months

## 2017-06-28 NOTE — Progress Notes (Signed)
Subjective:    Patient ID: Trevor Wheeler, male    DOB: 1946/06/28, 71 y.o.   MRN: [id number]  HPI  Patient presents for yearly preventative medicine examination. He is a pleasant 71 year old male who  has a past medical history of CVA (cerebral vascular accident) ([place]) (05/04/2015); Hypertension; and Type II diabetes mellitus ([place]) (dx'd 05/05/2015).  He takes Glipizide 10 mg BID and Metformin 250 mg BID due to history of diabetes. His home blood sugars have been in the 70-200's.  Lab Results  Component Value Date   HGBA1C 6.7 02/22/2017   He Takes lipitor 20 mg for hyperlipidemia   Due to history of hypertension and for renal protection from diabetes, he takes lisinopril 20 mg daily. His blood pressures at home have been consistently in the 120's   He continues to follow up with Cardiology on a yearly basis due to history of CVA, his next visit is in Dec 2018.   All immunizations and health maintenance protocols were reviewed with the patient and needed orders were placed. He is up to date on all vaccinations.   Appropriate screening laboratory values were ordered for the patient including screening of hyperlipidemia, renal function and hepatic function. If indicated by BPH, a PSA was ordered.  Medication reconciliation,  past medical history, social history, problem list and allergies were reviewed in detail with the patient  Goals were established with regard to weight loss, exercise, and  diet in compliance with medications. He continues to walk 2 miles per day and does light weight training. He does eat a diabetic diet.   End of life planning was discussed. He has an advanced directive and living will   He is up to date on colonoscopies, dental and vision screens    Review of Systems  Constitutional: Negative.   HENT: Negative.   Eyes: Negative.   Respiratory: Negative.   Cardiovascular: Negative.   Gastrointestinal: Negative.   Endocrine: Negative.     Genitourinary: Negative.   Musculoskeletal: Negative.   Skin: Negative.   Allergic/Immunologic: Negative.   Neurological: Negative.   Hematological: Negative.   Psychiatric/Behavioral: Negative.   All other systems reviewed and are negative.  Past Medical History:  Diagnosis Date  . CVA (cerebral vascular accident) ([place]) 05/04/2015   L cerebellar, "only problem I have is w/balance" 05/05/2015)  . Hypertension   . Type II diabetes mellitus ([place]) dx'd 05/05/2015    Social History   Social History  . Marital status: Married    Spouse name: N/A  . Number of children: N/A  . Years of education: N/A   Occupational History  . Not on file.   Social History Main Topics  . Smoking status: Former Smoker    Packs/day: 1.00    Years: 20.00    Types: Cigarettes    Quit date: 05/21/1974  . Smokeless tobacco: Never Used     Comment: quit smoking in the 1970's  . Alcohol use No  . Drug use: No  . Sexual activity: Not Currently   Other Topics Concern  . Not on file   Social History Narrative   Employed as a Therapist, music.    Married for 30 years   Has two daughters who live locally.        Past Surgical History:  Procedure Laterality Date  . EP IMPLANTABLE DEVICE N/A 05/07/2015   Procedure: Loop Recorder Insertion;  Surgeon: Sanda Klein, MD;  Location: [facility];  Service: Cardiovascular;  Laterality: N/A;  . NO PAST SURGERIES    . TEE WITHOUT CARDIOVERSION N/A 05/07/2015   Procedure: TRANSESOPHAGEAL ECHOCARDIOGRAM (TEE);  Surgeon: Skeet Latch, MD;  Location: [facility];  Service: Cardiovascular;  Laterality: N/A;    Family History  Problem Relation Age of Onset  . Heart disease Mother   . Colon cancer Brother   . Heart disease Brother     Allergies  Allergen Reactions  . Penicillins Hives    Current Outpatient Prescriptions on File Prior to Visit  Medication Sig Dispense Refill  . aspirin EC 325 MG tablet Take 1 tablet (325 mg total) by  mouth daily. 30 tablet 0  . atorvastatin (LIPITOR) 20 MG tablet Take 1 tablet (20 mg total) by mouth daily at 6 PM. 90 tablet 3  . glipiZIDE (GLUCOTROL) 10 MG tablet TAKE 1 TABLET (10 MG TOTAL) BY MOUTH 2 (TWO) TIMES DAILY BEFORE A MEAL. TAKE BEFORE EVENING MEAL 180 tablet 0  . glucose blood (ONETOUCH VERIO) test strip Use as instructed to check blood sugar once a day 100 each 12  . lisinopril (PRINIVIL,ZESTRIL) 20 MG tablet Take 1 tablet (20 mg total) by mouth daily. 90 tablet 3  . metFORMIN (GLUCOPHAGE) 500 MG tablet Take 0.5 tablets by mouth 2 (two) times daily.  1   No current facility-administered medications on file prior to visit.     BP 126/76 (BP Location: Left Arm)   Temp 98.4 F (36.9 C) (Oral)   Ht 5\' 10"  (1.778 m)   Wt 241 lb (109.3 kg)   BMI 34.58 kg/m       Objective:   Physical Exam  Constitutional: He is oriented to person, place, and time. He appears well-developed and well-nourished. No distress.  Obese   HENT:  Head: Normocephalic and atraumatic.  Right Ear: Hearing, tympanic membrane, external ear and ear canal normal.  Left Ear: External ear normal.  Nose: Nose normal.  Mouth/Throat: Oropharynx is clear and moist. He has dentures. No oropharyngeal exudate.  Eyes: Pupils are equal, round, and reactive to light. Conjunctivae and EOM are normal. Right eye exhibits no discharge. Left eye exhibits no discharge. No scleral icterus.  Neck: Normal range of motion. Neck supple. No JVD present. Carotid bruit is not present. No tracheal deviation present. No thyromegaly present.  Cardiovascular: Normal rate, regular rhythm, normal heart sounds and intact distal pulses.  Exam reveals no gallop and no friction rub.   No murmur heard. Pulmonary/Chest: Effort normal and breath sounds normal. No stridor. No respiratory distress. He has no wheezes. He has no rales. He exhibits no tenderness.  Abdominal: Soft. Bowel sounds are normal. He exhibits no distension and no mass.  There is no tenderness. There is no rebound and no guarding.  Genitourinary: Rectum normal and prostate normal.  Musculoskeletal: Normal range of motion. He exhibits no edema, tenderness or deformity.  Lymphadenopathy:    He has no cervical adenopathy.  Neurological: He is alert and oriented to person, place, and time. He has normal reflexes. He displays normal reflexes. No cranial nerve deficit. He exhibits normal muscle tone. Coordination normal.  Skin: Skin is warm and dry. No rash noted. He is not diaphoretic. No erythema. No pallor.  Psychiatric: He has a normal mood and affect. His behavior is normal. Judgment and thought content normal.  Nursing note and vitals reviewed.     Assessment & Plan:  1. Routine general medical examination at a health care facilit - Basic metabolic panel - CBC with Differential/Platelet -  Hemoglobin A1c - Hepatic function panel - Lipid panel - TSH - PSA - Hep C Antibody  2. Controlled type 2 diabetes mellitus with other circulatory complication, without long-term current use of insulin ([place]) - Foot exam done.  - Educated on diet and exercise  - Basic metabolic panel - CBC with Differential/Platelet - Hemoglobin A1c - Hepatic function panel - Lipid panel - TSH - PSA  3. Essential hypertension - Well controlled. No change in medication - Basic metabolic panel - CBC with Differential/Platelet - Hemoglobin A1c - Hepatic function panel - Lipid panel - TSH - PSA  4. Mixed hyperlipidemia - consider adding statin  - Basic metabolic panel - CBC with Differential/Platelet - Hemoglobin A1c - Hepatic function panel - Lipid panel - TSH - PSA  5. Need for hepatitis C screening test  - Hep C Antibody   Dorothyann Peng, NP

## 2017-06-29 LAB — HEPATITIS C ANTIBODY
HEP C AB: NONREACTIVE
SIGNAL TO CUT-OFF: 0.01 (ref <1.00)

## 2017-07-08 ENCOUNTER — Ambulatory Visit (INDEPENDENT_AMBULATORY_CARE_PROVIDER_SITE_OTHER): Payer: Medicare Other

## 2017-07-08 VITALS — BP 138/80 | HR 72 | Ht 71.0 in | Wt 242.0 lb

## 2017-07-08 DIAGNOSIS — Z0000 Encounter for general adult medical examination without abnormal findings: Secondary | ICD-10-CM | POA: Diagnosis not present

## 2017-07-08 NOTE — Patient Instructions (Addendum)
[name] , Thank you for taking time to come for your Medicare Wellness Visit. I appreciate your ongoing commitment to your health goals. Please review the following plan we discussed and let me know if I can assist you in the future.   Will discuss AAA (Abd Aortic aneurysm due remote smoking hx)  If he does not order, Tommi Rumps can order next you are in   I have postponed your eye exam in lieu of your recent dilated eye exam couple of months ago Next apt is 09/08/2017  A Tetanus is recommended every 10 years. Medicare covers a tetanus if you have a cut or wound; otherwise, there may be a charge. If you had not had a tetanus with pertusses, known as the Tdap, you can take this anytime.  Last time was an injury at work.  Will try to complete AD; Given copy  Referred to [facility] for questions [place] offers free advance directive forms, as well as assistance in completing the forms themselves. For assistance, contact the Spiritual Care Department at [phone], or the Clinical Social Work Department at [phone].   Ad8 score reviewed for issues:  Issues making decisions:  Less interest in hobbies / activities:  Repeats questions, stories (family complaining):  Trouble using ordinary gadgets (microwave, computer, phone):  Forgets the month or year:   Mismanaging finances:   Remembering appts:  Daily problems with thinking and/or memory:       These are the goals we discussed: Goals    . Weight (lb) < 205 lb (93 kg)          Start going to the gym- x2 per week Cut back on fried foods to 2 times a week        This is a list of the screening recommended for you and due dates:  Health Maintenance  Topic Date Due  . Tetanus Vaccine  02/07/1966  . Eye exam for diabetics  12/21/2016  . Hemoglobin A1C  12/27/2017  . Colon Cancer Screening  04/26/2018  . Complete foot exam   06/28/2018  . Flu Shot  Completed  .  Hepatitis C: One time screening is recommended by  Center for Disease Control  (CDC) for  adults born from [birth year] through 1965.   Completed  . Pneumonia vaccines  Completed    Prevention of falls: Remove rugs or any tripping hazards in the home Use Non slip mats in bathtubs and showers Placing grab bars next to the toilet and or shower Placing handrails on both sides of the stair way Adding extra lighting in the home.   Personal safety issues reviewed:  1. Consider starting a community watch program per [facility] 2.  Changes batteries is smoke detector and/or carbon monoxide detector  3.  If you have firearms; keep them in a safe place 4.  Wear protection when in the sun; Always wear sunscreen or a hat; It is good to have your doctor check your skin annually or review any new areas of concern 5. Driving safety; Keep in the right lane; stay 3 car lengths behind the car in front of you on the highway; look 3 times prior to pulling out; carry your cell phone everywhere you go!    Learn about the Yellow Dot program:  The program allows first responders at your emergency to have access to who your physician is, as well as your medications and medical conditions.  Citizens requesting the Yellow Dot Packages should Teacher, music  Nunzio Cobbs at the [facility] [phone] for the first week of the program and beginning the week after Easter citizens should contact their Scientist, physiological.        Diabetes and Foot Care Diabetes may cause you to have problems because of poor blood supply (circulation) to your feet and legs. This may cause the skin on your feet to become thinner, break easier, and heal more slowly. Your skin may become dry, and the skin may peel and crack. You may also have nerve damage in your legs and feet causing decreased feeling in them. You may not notice minor injuries to your feet that could lead to infections or more serious problems. Taking care of your feet is one  of the most important things you can do for yourself. Follow these instructions at home:  Wear shoes at all times, even in the house. Do not go barefoot. Bare feet are easily injured.  Check your feet daily for blisters, cuts, and redness. If you cannot see the bottom of your feet, use a mirror or ask someone for help.  Wash your feet with warm water (do not use hot water) and mild soap. Then pat your feet and the areas between your toes until they are completely dry. Do not soak your feet as this can dry your skin.  Apply a moisturizing lotion or petroleum jelly (that does not contain alcohol and is unscented) to the skin on your feet and to dry, brittle toenails. Do not apply lotion between your toes.  Trim your toenails straight across. Do not dig under them or around the cuticle. File the edges of your nails with an emery board or nail file.  Do not cut corns or calluses or try to remove them with medicine.  Wear clean socks or stockings every day. Make sure they are not too tight. Do not wear knee-high stockings since they may decrease blood flow to your legs.  Wear shoes that fit properly and have enough cushioning. To break in new shoes, wear them for just a few hours a day. This prevents you from injuring your feet. Always look in your shoes before you put them on to be sure there are no objects inside.  Do not cross your legs. This may decrease the blood flow to your feet.  If you find a minor scrape, cut, or break in the skin on your feet, keep it and the skin around it clean and dry. These areas may be cleansed with mild soap and water. Do not cleanse the area with peroxide, alcohol, or iodine.  When you remove an adhesive bandage, be sure not to damage the skin around it.  If you have a wound, look at it several times a day to make sure it is healing.  Do not use heating pads or hot water bottles. They may burn your skin. If you have lost feeling in your feet or legs, you may  not know it is happening until it is too late.  Make sure your health care provider performs a complete foot exam at least annually or more often if you have foot problems. Report any cuts, sores, or bruises to your health care provider immediately. Contact a health care provider if:  You have an injury that is not healing.  You have cuts or breaks in the skin.  You have an ingrown nail.  You notice redness on your legs or feet.  You feel burning or tingling  in your legs or feet.  You have pain or cramps in your legs and feet.  Your legs or feet are numb.  Your feet always feel cold. Get help right away if:  There is increasing redness, swelling, or pain in or around a wound.  There is a red line that goes up your leg.  Pus is coming from a wound.  You develop a fever or as directed by your health care provider.  You notice a bad smell coming from an ulcer or wound. This information is not intended to replace advice given to you by your health care provider. Make sure you discuss any questions you have with your health care provider. Document Released: 09/03/2000 Document Revised: 02/12/2016 Document Reviewed: 02/13/2013 Elsevier Interactive Patient Education  2017 [place] Prevention in the Home Falls can cause injuries. They can happen to people of all ages. There are many things you can do to make your home safe and to help prevent falls. What can I do on the outside of my home?  Regularly fix the edges of walkways and driveways and fix any cracks.  Remove anything that might make you trip as you walk through a door, such as a raised step or threshold.  Trim any bushes or trees on the path to your home.  Use bright outdoor lighting.  Clear any walking paths of anything that might make someone trip, such as rocks or tools.  Regularly check to see if handrails are loose or broken. Make sure that both sides of any steps have handrails.  Any raised  decks and porches should have guardrails on the edges.  Have any leaves, snow, or ice cleared regularly.  Use sand or salt on walking paths during winter.  Clean up any spills in your garage right away. This includes oil or grease spills. What can I do in the bathroom?  Use night lights.  Install grab bars by the toilet and in the tub and shower. Do not use towel bars as grab bars.  Use non-skid mats or decals in the tub or shower.  If you need to sit down in the shower, use a plastic, non-slip stool.  Keep the floor dry. Clean up any water that spills on the floor as soon as it happens.  Remove soap buildup in the tub or shower regularly.  Attach bath mats securely with double-sided non-slip rug tape.  Do not have throw rugs and other things on the floor that can make you trip. What can I do in the bedroom?  Use night lights.  Make sure that you have a light by your bed that is easy to reach.  Do not use any sheets or blankets that are too big for your bed. They should not hang down onto the floor.  Have a firm chair that has side arms. You can use this for support while you get dressed.  Do not have throw rugs and other things on the floor that can make you trip. What can I do in the kitchen?  Clean up any spills right away.  Avoid walking on wet floors.  Keep items that you use a lot in easy-to-reach places.  If you need to reach something above you, use a strong step stool that has a grab bar.  Keep electrical cords out of the way.  Do not use floor polish or wax that makes floors slippery. If you must use wax, use non-skid floor wax.  Do  not have throw rugs and other things on the floor that can make you trip. What can I do with my stairs?  Do not leave any items on the stairs.  Make sure that there are handrails on both sides of the stairs and use them. Fix handrails that are broken or loose. Make sure that handrails are as long as the stairways.  Check  any carpeting to make sure that it is firmly attached to the stairs. Fix any carpet that is loose or worn.  Avoid having throw rugs at the top or bottom of the stairs. If you do have throw rugs, attach them to the floor with carpet tape.  Make sure that you have a light switch at the top of the stairs and the bottom of the stairs. If you do not have them, ask someone to add them for you. What else can I do to help prevent falls?  Wear shoes that: ? Do not have high heels. ? Have rubber bottoms. ? Are comfortable and fit you well. ? Are closed at the toe. Do not wear sandals.  If you use a stepladder: ? Make sure that it is fully opened. Do not climb a closed stepladder. ? Make sure that both sides of the stepladder are locked into place. ? Ask someone to hold it for you, if possible.  Clearly mark and make sure that you can see: ? Any grab bars or handrails. ? First and last steps. ? Where the edge of each step is.  Use tools that help you move around (mobility aids) if they are needed. These include: ? Canes. ? Walkers. ? Scooters. ? Crutches.  Turn on the lights when you go into a dark area. Replace any light bulbs as soon as they burn out.  Set up your furniture so you have a clear path. Avoid moving your furniture around.  If any of your floors are uneven, fix them.  If there are any pets around you, be aware of where they are.  Review your medicines with your doctor. Some medicines can make you feel dizzy. This can increase your chance of falling. Ask your doctor what other things that you can do to help prevent falls. This information is not intended to replace advice given to you by your health care provider. Make sure you discuss any questions you have with your health care provider. Document Released: 07/03/2009 Document Revised: 02/12/2016 Document Reviewed: 10/11/2014 Elsevier Interactive Patient Education  2018 [place] Maintenance, Male A  healthy lifestyle and preventive care is important for your health and wellness. Ask your health care provider about what schedule of regular examinations is right for you. What should I know about weight and diet? Eat a Healthy Diet  Eat plenty of vegetables, fruits, whole grains, low-fat dairy products, and lean protein.  Do not eat a lot of foods high in solid fats, added sugars, or salt.  Maintain a Healthy Weight Regular exercise can help you achieve or maintain a healthy weight. You should:  Do at least 150 minutes of exercise each week. The exercise should increase your heart rate and make you sweat (moderate-intensity exercise).  Do strength-training exercises at least twice a week.  Watch Your Levels of Cholesterol and Blood Lipids  Have your blood tested for lipids and cholesterol every 5 years starting at 70 years of age. If you are at high risk for heart disease, you should start having your blood tested when you are  69 years old. You may need to have your cholesterol levels checked more often if: ? Your lipid or cholesterol levels are high. ? You are older than 70 years of age. ? You are at high risk for heart disease.  What should I know about cancer screening? Many types of cancers can be detected early and may often be prevented. Lung Cancer  You should be screened every year for lung cancer if: ? You are a current smoker who has smoked for at least 30 years. ? You are a former smoker who has quit within the past 15 years.  Talk to your health care provider about your screening options, when you should start screening, and how often you should be screened.  Colorectal Cancer  Routine colorectal cancer screening usually begins at 70 years of age and should be repeated every 5-10 years until you are 70 years old. You may need to be screened more often if early forms of precancerous polyps or small growths are found. Your health care provider may recommend screening at  an earlier age if you have risk factors for colon cancer.  Your health care provider may recommend using home test kits to check for hidden blood in the stool.  A small camera at the end of a tube can be used to examine your colon (sigmoidoscopy or colonoscopy). This checks for the earliest forms of colorectal cancer.  Prostate and Testicular Cancer  Depending on your age and overall health, your health care provider may do certain tests to screen for prostate and testicular cancer.  Talk to your health care provider about any symptoms or concerns you have about testicular or prostate cancer.  Skin Cancer  Check your skin from head to toe regularly.  Tell your health care provider about any new moles or changes in moles, especially if: ? There is a change in a mole's size, shape, or color. ? You have a mole that is larger than a pencil eraser.  Always use sunscreen. Apply sunscreen liberally and repeat throughout the day.  Protect yourself by wearing long sleeves, pants, a wide-brimmed hat, and sunglasses when outside.  What should I know about heart disease, diabetes, and high blood pressure?  If you are 64-28 years of age, have your blood pressure checked every 3-5 years. If you are 67 years of age or older, have your blood pressure checked every year. You should have your blood pressure measured twice-once when you are at a hospital or clinic, and once when you are not at a hospital or clinic. Record the average of the two measurements. To check your blood pressure when you are not at a hospital or clinic, you can use: ? An automated blood pressure machine at a pharmacy. ? A home blood pressure monitor.  Talk to your health care provider about your target blood pressure.  If you are between 54-83 years old, ask your health care provider if you should take aspirin to prevent heart disease.  Have regular diabetes screenings by checking your fasting blood sugar level. ? If you are  at a normal weight and have a low risk for diabetes, have this test once every three years after the age of 106. ? If you are overweight and have a high risk for diabetes, consider being tested at a younger age or more often.  A one-time screening for abdominal aortic aneurysm (AAA) by ultrasound is recommended for men aged 65-75 years who are current or former smokers. What should  I know about preventing infection? Hepatitis B If you have a higher risk for hepatitis B, you should be screened for this virus. Talk with your health care provider to find out if you are at risk for hepatitis B infection. Hepatitis C Blood testing is recommended for:  Everyone born from [birth year] through 1964.  Anyone with known risk factors for hepatitis C.  Sexually Transmitted Diseases (STDs)  You should be screened each year for STDs including gonorrhea and chlamydia if: ? You are sexually active and are younger than 70 years of age. ? You are older than 70 years of age and your health care provider tells you that you are at risk for this type of infection. ? Your sexual activity has changed since you were last screened and you are at an increased risk for chlamydia or gonorrhea. Ask your health care provider if you are at risk.  Talk with your health care provider about whether you are at high risk of being infected with HIV. Your health care provider may recommend a prescription medicine to help prevent HIV infection.  What else can I do?  Schedule regular health, dental, and eye exams.  Stay current with your vaccines (immunizations).  Do not use any tobacco products, such as cigarettes, chewing tobacco, and e-cigarettes. If you need help quitting, ask your health care provider.  Limit alcohol intake to no more than 2 drinks per day. One drink equals 12 ounces of beer, 5 ounces of wine, or 1 ounces of hard liquor.  Do not use street drugs.  Do not share needles.  Ask your health care provider for  help if you need support or information about quitting drugs.  Tell your health care provider if you often feel depressed.  Tell your health care provider if you have ever been abused or do not feel safe at home. This information is not intended to replace advice given to you by your health care provider. Make sure you discuss any questions you have with your health care provider. Document Released: 03/04/2008 Document Revised: 05/05/2016 Document Reviewed: 06/10/2015 Elsevier Interactive Patient Education  [employer].

## 2017-07-08 NOTE — Progress Notes (Signed)
Subjective:   Trevor Wheeler is a 71 y.o. male who presents for Medicare Annual/Subsequent preventive examination.  The Patient was informed that the wellness visit is to identify future health risk and educate and initiate measures that can reduce risk for increased disease through the lifespan.    Annual Wellness Assessment  Reports health as good  Psychosocial Lives with spouse 16 yo 2 girls    Preventive Screening -Counseling & Management  Medicare Annual Preventive Care Visit - Subsequent Last OV 10/09  There are no preventive care reminders to display for this patient.   Discussed AAA check due to remote smoking history   VS reviewed;  Checks bp 3 to 4 times a day 195 to 093 systolic at home  Diet  Breakfast; am oatmeal; sometimes sandwich Lunch; salad or sandwich; periodic hamburger Supper - chicken;  Eats some friend foods on average 4 times a week Vegetables    BMI 33.8 but states this is his normal weight   Exercise Walks the dog x 2 miles am and pm  Takes 40 minutes  Had a garden with tomatoes   Dental - no- has full dentures   Stressors: enjoying retirement   Sleep patterns: sleeps well   Pain no pain     Cardiac Risk Factors Addressed Hyperlipidemia - chol 127; HDL 31; trig 112; LDL 73 Pre-diabetes - A1c 6.6     Advanced Directives; has not completed but has a copy   Patient Care Team: Dorothyann Peng, NP as PCP - General (Family Medicine)  Cardiologist; Croitoru, Mihai    Cardiac Risk Factors include: advanced age (>23men, >30 women);diabetes mellitus;dyslipidemia;family history of premature cardiovascular disease;hypertension;male gender;obesity (BMI >30kg/m2)     Objective:    Vitals: BP 138/80   Pulse 72   Ht 5\' 11"  (1.803 m)   Wt 242 lb (109.8 kg)   SpO2 98%   BMI 33.75 kg/m   Body mass index is 33.75 kg/m.  Tobacco History  Smoking Status  . Former Smoker  . Packs/day: 1.00  . Years: 20.00  . Types:  Cigarettes  . Quit date: 05/21/1974  Smokeless Tobacco  . Never Used    Comment: quit smoking in the 1970's     Counseling given: Yes   Past Medical History:  Diagnosis Date  . CVA (cerebral vascular accident) ([place]) 05/04/2015   L cerebellar, "only problem I have is w/balance" 05/05/2015)  . Hypertension   . Type II diabetes mellitus ([place]) dx'd 05/05/2015   Past Surgical History:  Procedure Laterality Date  . Cataract Right 2018  . EP IMPLANTABLE DEVICE N/A 05/07/2015   Procedure: Loop Recorder Insertion;  Surgeon: Sanda Klein, MD;  Location: [facility];  Service: Cardiovascular;  Laterality: N/A;  . NO PAST SURGERIES    . TEE WITHOUT CARDIOVERSION N/A 05/07/2015   Procedure: TRANSESOPHAGEAL ECHOCARDIOGRAM (TEE);  Surgeon: Skeet Latch, MD;  Location[facility] ENDOSCOPY;  Service: Cardiovascular;  Laterality: N/A;   Family History  Problem Relation Age of Onset  . Heart disease Mother   . Colon cancer Brother   . Heart disease Brother    History  Sexual Activity  . Sexual activity: Not Currently    Outpatient Encounter Prescriptions as of 07/08/2017  Medication Sig  . aspirin EC 325 MG tablet Take 1 tablet (325 mg total) by mouth daily.  Marland Kitchen atorvastatin (LIPITOR) 20 MG tablet Take 1 tablet (20 mg total) by mouth daily at 6 PM.  . glipiZIDE (GLUCOTROL) 10 MG tablet  TAKE 1 TABLET (10 MG TOTAL) BY MOUTH 2 (TWO) TIMES DAILY BEFORE A MEAL. TAKE BEFORE EVENING MEAL  . glucose blood (ONETOUCH VERIO) test strip Use as instructed to check blood sugar once a day  . lisinopril (PRINIVIL,ZESTRIL) 20 MG tablet Take 1 tablet (20 mg total) by mouth daily.  . metFORMIN (GLUCOPHAGE) 500 MG tablet Take 0.5 tablets by mouth 2 (two) times daily.   No facility-administered encounter medications on file as of 07/08/2017.     Activities of Daily Living In your present state of health, do you have any difficulty performing the following activities: 07/08/2017  Hearing? N  Vision? N    Difficulty concentrating or making decisions? N  Walking or climbing stairs? N  Dressing or bathing? N  Doing errands, shopping? N  Preparing Food and eating ? N  Using the Toilet? N  In the past six months, have you accidently leaked urine? N  Do you have problems with loss of bowel control? N  Managing your Medications? N  Managing your Finances? N  Housekeeping or managing your Housekeeping? N  Some recent data might be hidden    Patient Care Team: Dorothyann Peng, NP as PCP - General (Family Medicine)   Assessment:     Exercise Activities and Dietary recommendations Current Exercise Habits: Home exercise routine, Type of exercise: strength training/weights, Time (Minutes): > 60, Frequency (Times/Week): 5, Weekly Exercise (Minutes/Week): 0, Intensity: Moderate  Goals    . Weight (lb) < 205 lb (93 kg)          Start going to the gym- x2 per week Cut back on fried foods to 2 times a week       Fall Risk Fall Risk  07/08/2017 06/28/2017 06/19/2015  Falls in the past year? No No No   Depression Screen PHQ 2/9 Scores 07/08/2017 06/28/2017 06/19/2015  PHQ - 2 Score 0 0 0    Cognitive Function MMSE - Mini Mental State Exam 07/08/2017  Not completed: (No Data)  Just put a motor in a old truck he is fixing  Education offered       Immunization History  Administered Date(s) Administered  . Influenza, High Dose Seasonal PF 06/19/2015, 06/23/2016  . Influenza-Unspecified 05/21/2017  . Pneumococcal Conjugate-13 06/23/2016  . Pneumococcal Polysaccharide-23 05/06/2015   Screening Tests Health Maintenance  Topic Date Due  . OPHTHALMOLOGY EXAM  04/19/2018 (Originally 12/21/2016)  . TETANUS/TDAP  07/07/2018 (Originally 02/07/1966)  . HEMOGLOBIN A1C  12/27/2017  . COLONOSCOPY  04/26/2018  . FOOT EXAM  06/28/2018  . INFLUENZA VACCINE  Completed  . Hepatitis C Screening  Completed  . PNA vac Low Risk Adult  Completed      Plan:       PCP Notes  Health  Maintenance Educated regarding shingrix and tetanus  Had eye exam x 2 months ago and was normal; postponed eye exam  Has info on AD but has not completed Given info on [facility] Will try to complete AD; Given copy  Referred to [facility] for questions [facility] offers free advance directive forms, as well as assistance in completing the forms themselves. For assistance, contact the Spiritual Care Department at [phone], or the Clinical Social Work Department at [phone].   Good BS control    Abnormal Screens  none  Referrals  None   Patient concerns; None   Nurse Concerns; As noted Encouraged to decrease fried foods and discussed losing 10 lbs Agreed to start exercising   Next PCP apt 12/2017  I have personally reviewed and noted the following in the patient's chart:   . Medical and social history . Use of alcohol, tobacco or illicit drugs  . Current medications and supplements . Functional ability and status . Nutritional status . Physical activity . Advanced directives . List of other physicians . Hospitalizations, surgeries, and ER visits in previous 12 months . Vitals . Screenings to include cognitive, depression, and falls . Referrals and appointments  In addition, I have reviewed and discussed with patient certain preventive protocols, quality metrics, and best practice recommendations. A written personalized care plan for preventive services as well as general preventive health recommendations were provided to patient.     Wynetta Fines, RN  07/08/2017

## 2017-07-08 NOTE — Progress Notes (Signed)
I agree with this plan.

## 2017-07-28 ENCOUNTER — Other Ambulatory Visit: Payer: Self-pay | Admitting: Cardiovascular Disease

## 2017-07-29 IMAGING — CT CT ANGIO NECK
2 of 9 series · 16 of 46 positions shown, 18 images · IV contrast (omnipaque)
Comparison: Brain MRI and intracranial MRA 05/05/2015.

CLINICAL DATA: 69-year-old male with acute left greater than right
cerebellar infarcts. Symptoms beginning on 05/04/2015. Initial
encounter.

EXAM:
CT ANGIOGRAPHY NECK
TECHNIQUE: Multidetector CT imaging of the neck was performed using the
standard protocol during bolus administration of intravenous
contrast. Multiplanar CT image reconstructions and MIPs were
obtained to evaluate the vascular anatomy. Carotid stenosis
measurements (when applicable) are obtained utilizing NASCET
criteria, using the distal internal carotid diameter as the
denominator.
CONTRAST:  50mL OMNIPAQUE IOHEXOL 350 MG/ML SOLN

[Series 6: thin axial · axial · 0.46mm/px · z∈[+1142,+1373]mm · 13 of 263 slices shown, 15 images]
[im 16/263  soft-tissue]
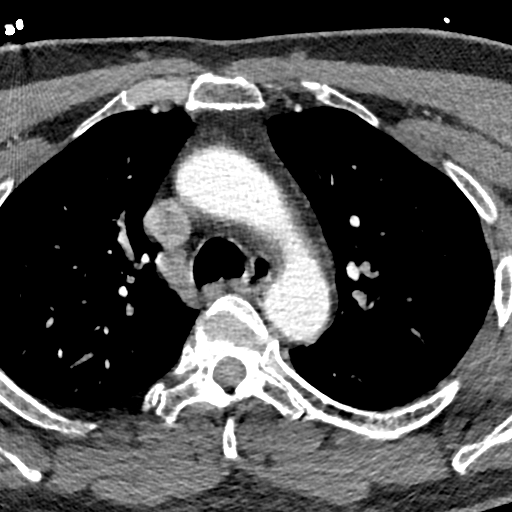
[im 16/263  bone]
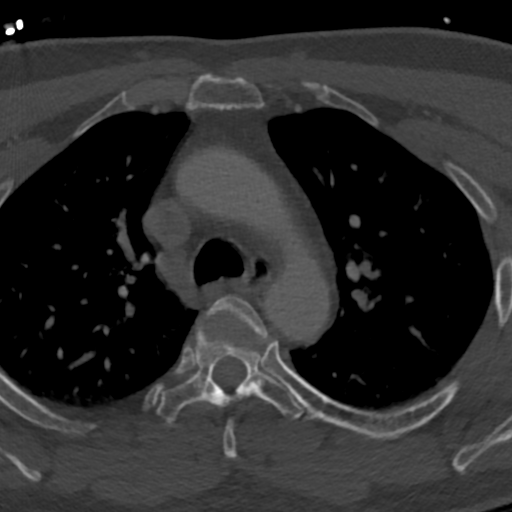
[im 31/263  soft-tissue]
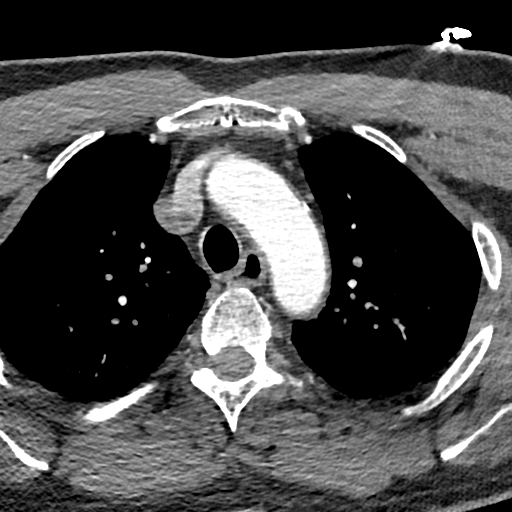
[im 62/263  soft-tissue]
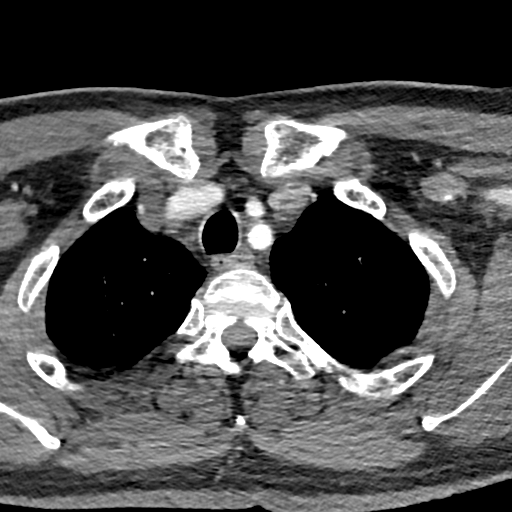
[im 78/263  soft-tissue]
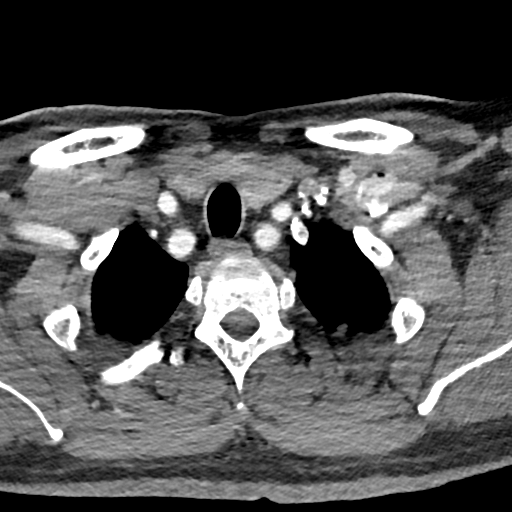
[im 93/263  soft-tissue]
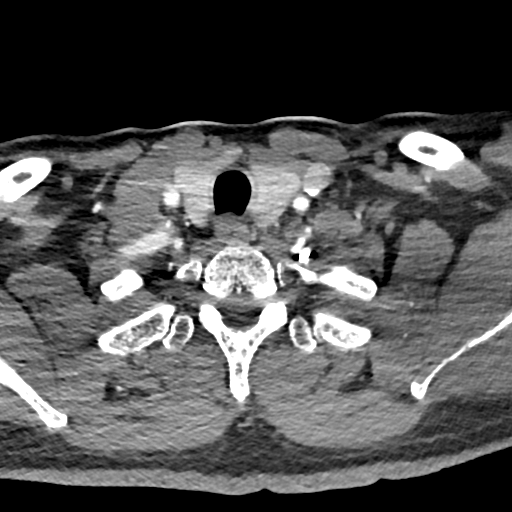
[im 108/263  soft-tissue]
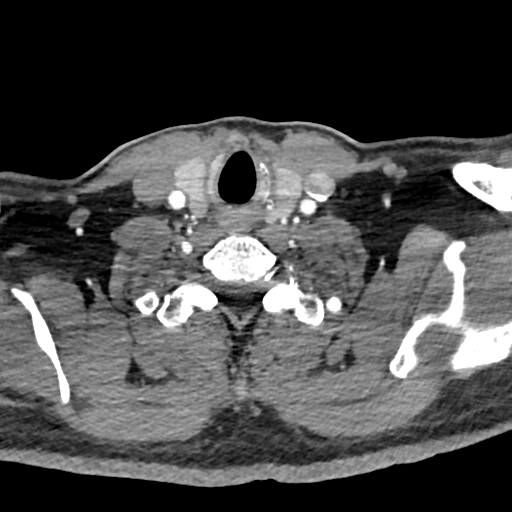
[im 139/263  soft-tissue]
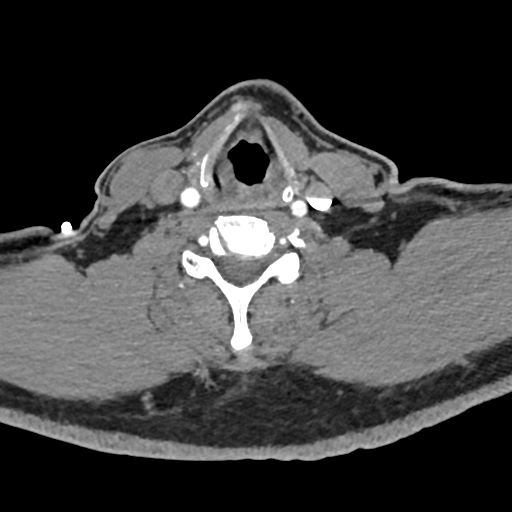
[im 155/263  soft-tissue]
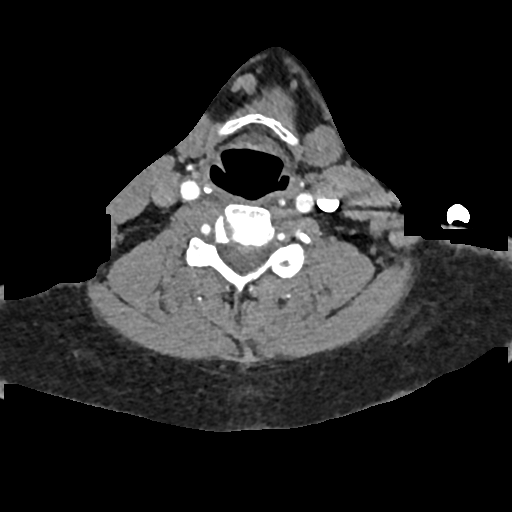
[im 170/263  soft-tissue]
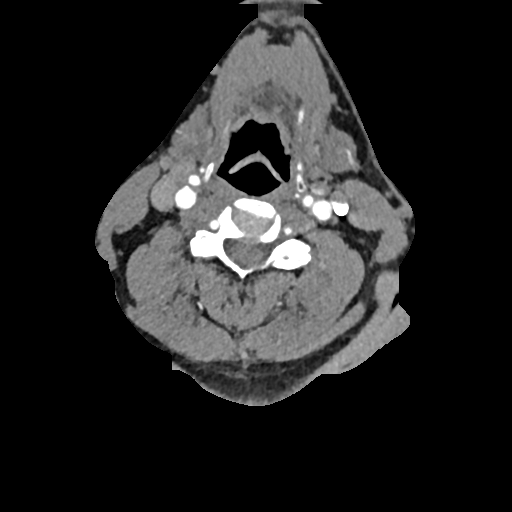
[im 170/263  bone]
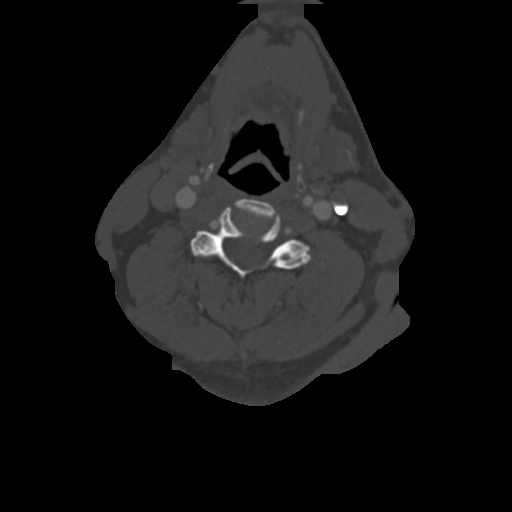
[im 185/263  soft-tissue]
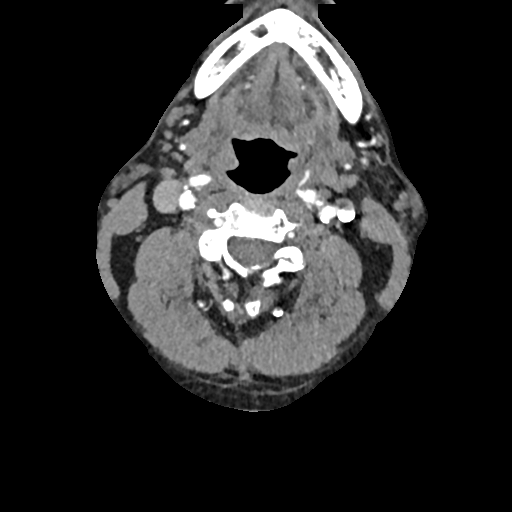
[im 201/263  soft-tissue]
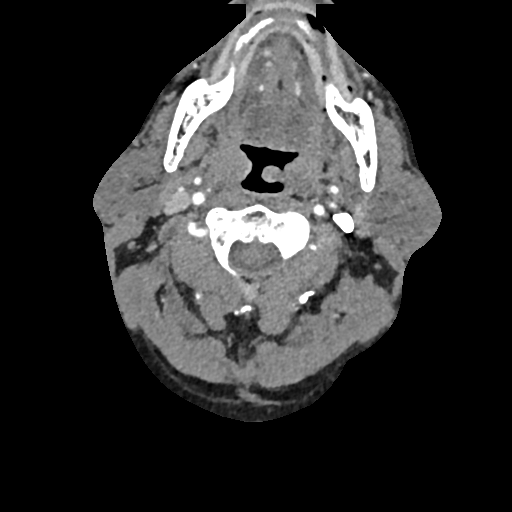
[im 232/263  soft-tissue]
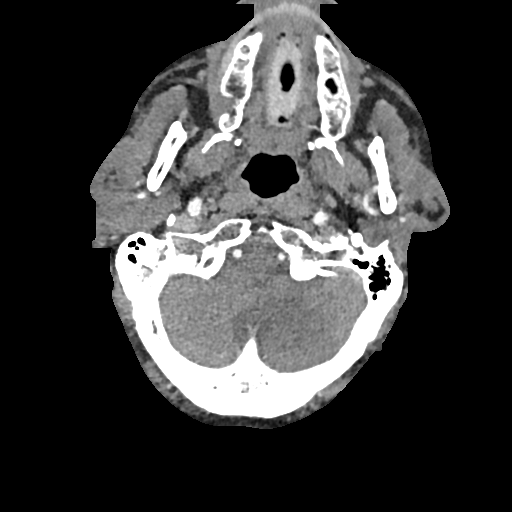
[im 247/263  soft-tissue]
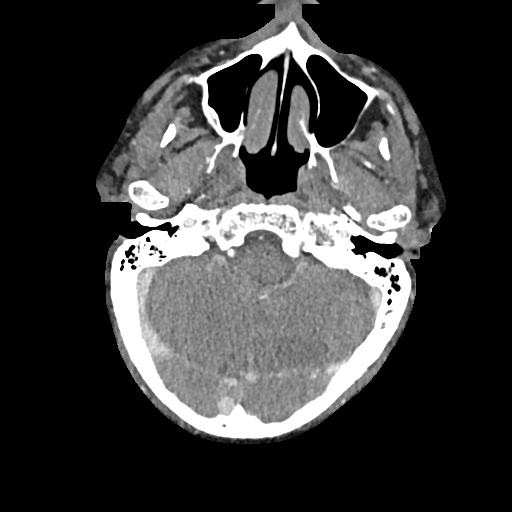

[Series 7: coronal thin · coronal · 0.54mm/px · 3 of 216 slices shown]
[im 62/216  soft-tissue]
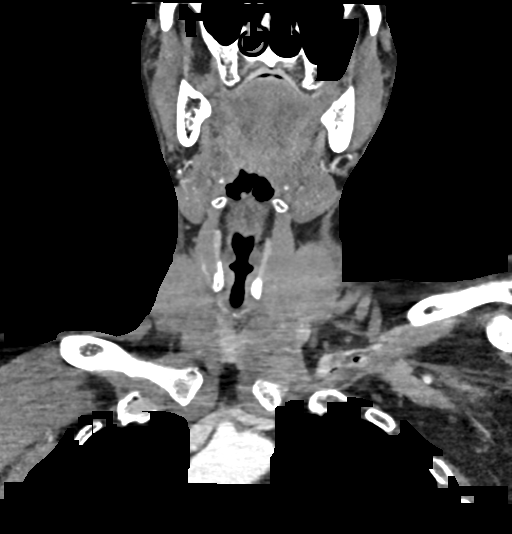
[im 93/216  soft-tissue]
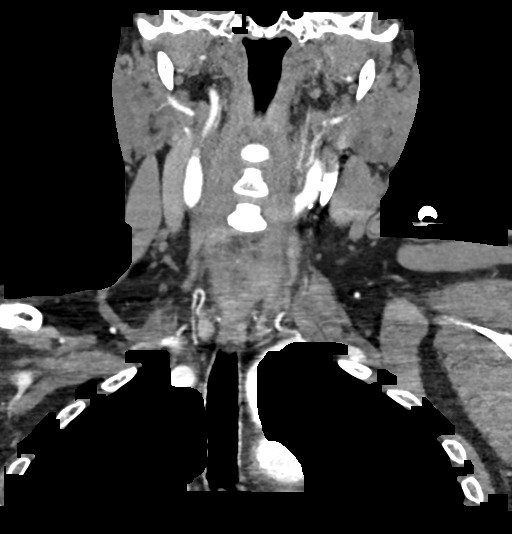
[im 123/216  soft-tissue]
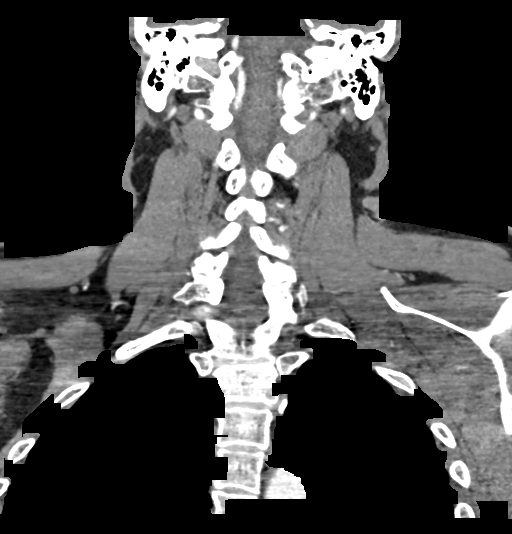

[16 of 46 positions shown; findings below may reference images not displayed]

FINDINGS: Skeleton: Absent dentition. Mild for age cervical spine
degeneration. No acute osseous abnormality identified. Visualized
paranasal sinuses and mastoids are clear.

Other neck: Negative lung apices. No superior mediastinal
lymphadenopathy.

Thyroid, larynx, pharynx, parapharyngeal spaces, retropharyngeal
space, sublingual space, submandibular glands and parotid glands are
within normal limits. No cervical lymphadenopathy.

Aortic arch: 3 vessel arch configuration. Mild for age soft and
calcified arch atherosclerosis. No great vessel origin stenosis.

Right carotid system: Minor soft and calcified plaque at the right
carotid bifurcation which is widely patent. Mild calcified plaque
continuing in the right ICA bulb without stenosis. Negative cervical
right ICA otherwise.

Left carotid system: Mild soft and calcified plaque at the left
carotid bifurcation which is widely patent. Mild to moderate
calcified plaque continuing in the left ICA bulb, but with less than
50 % stenosis with respect to the distal vessel (series 5, image
94). Negative cervical left ICA otherwise.

Vertebral arteries:

No proximal right subclavian artery stenosis with mild soft and
calcified plaque. Normal right vertebral artery origin. Dominant
right vertebral artery is normal to the skullbase. Patent
vertebrobasilar junction.

No proximal left subclavian artery stenosis despite soft and
calcified plaque. Normal left vertebral artery origin. Non dominant
left vertebral artery is patent to the skullbase with mild
irregularity, but without stenosis. The distal left vertebral artery
functionally terminates in the left PICA a which appears diminutive
similar to the recent intracranial MRA. The diminutive left
vertebral artery beyond the left PICA remains patent.
IMPRESSION: No hemodynamically significant carotid or vertebral artery stenosis
in the neck, as detailed above.

Dominant right vertebral artery. Left greater than right carotid
atherosclerosis.

## 2017-09-01 ENCOUNTER — Other Ambulatory Visit: Payer: Self-pay | Admitting: Adult Health

## 2017-09-01 DIAGNOSIS — E1165 Type 2 diabetes mellitus with hyperglycemia: Principal | ICD-10-CM

## 2017-09-01 DIAGNOSIS — IMO0001 Reserved for inherently not codable concepts without codable children: Secondary | ICD-10-CM

## 2017-09-01 NOTE — Telephone Encounter (Signed)
Filled for 6 months.  Pt has follow up scheduled on 12/27/17.

## 2017-09-12 ENCOUNTER — Encounter: Payer: Self-pay | Admitting: Cardiovascular Disease

## 2017-09-12 ENCOUNTER — Ambulatory Visit: Payer: Medicare Other | Admitting: Cardiovascular Disease

## 2017-09-12 VITALS — BP 140/84 | HR 74 | Ht 71.0 in | Wt 241.0 lb

## 2017-09-12 DIAGNOSIS — I639 Cerebral infarction, unspecified: Secondary | ICD-10-CM | POA: Diagnosis not present

## 2017-09-12 DIAGNOSIS — E669 Obesity, unspecified: Secondary | ICD-10-CM

## 2017-09-12 DIAGNOSIS — E782 Mixed hyperlipidemia: Secondary | ICD-10-CM

## 2017-09-12 DIAGNOSIS — I10 Essential (primary) hypertension: Secondary | ICD-10-CM

## 2017-09-12 DIAGNOSIS — E668 Other obesity: Secondary | ICD-10-CM

## 2017-09-12 DIAGNOSIS — Z4509 Encounter for adjustment and management of other cardiac device: Secondary | ICD-10-CM

## 2017-09-12 DIAGNOSIS — E1159 Type 2 diabetes mellitus with other circulatory complications: Secondary | ICD-10-CM

## 2017-09-12 NOTE — Patient Instructions (Signed)
Dr Sallyanne Kuster recommends that you have your loop recorder taken out. This will be performed at [facility] in [place]. Please arrive to the hospital no later than 1:00p.    Go to the [facility] located at [place], Entrance 'A.' Free valet parking service is available.   You do not need to be fasting.   You do not need pre-procedure lab work or testing.   Please bring your insurance cards and a list of your medications with you.   Wash your chest and neck with the surgical soap provided the evening before and the morning of your procedure. Please following the washing instructions provided (see 'Preparing for Surgery').   There aren't any restrictions after the procedure. You will receive wound care instructions upon discharge.   If you have any questions after you get home, please call the office at [phone].   Thank you,  Dr Dani Gobble Croitoru   *Special Note: Every effort is made to have your procedure done on time. Occasionally there are emergencies that present themselves at the hospital that may cause delays. Please be patient if a delay does occur.

## 2017-09-12 NOTE — Progress Notes (Signed)
Cardiology Office Note    Date:  09/12/2017   ID:  Durward, Matranga 10-08-1945, MRN [id number]  PCP:  Dorothyann Peng, NP  Cardiologist:   Sanda Klein, MD   Chief Complaint  Patient presents with  . Follow-up    1 yr    History of Present Illness:  Trevor Wheeler is a 71 y.o. male with cryptogenic stroke, hypertension, DM and hyperlipidemia returning for follow-up.   An implantable loop recorder was placed when he had the left cerebellar stroke in August 2016 but has not recorded atrial fibrillation. A few episodes of brief paroxysmal atrial tachycardia have been recorded, none in over a year. He was enrolled in a epigastrium versus aspirin trial which has since ended. He is now on open label aspirin.   Interrogation of his loop recorder today shows no evidence of significant arrhythmia. He had a single nocturnal pause of almost 3 seconds that occurred in a pattern suggestive of vagal bradycardia, 03/21/2017. He thinks he was probably asleep at the time.  His blood pressure is slightly elevated today, but he checks it regularly at home and is consistently in the 130s/60s or lower. He remains physically active, although he is not walking daily anymore. He does fairly intense physical work at times including remodeling his neighbors bathroom and doing all his lawn work.  The patient specifically denies any chest pain at rest exertion, dyspnea at rest or with exertion, orthopnea, paroxysmal nocturnal dyspnea, syncope, palpitations, focal neurological deficits, intermittent claudication, lower extremity edema, unexplained weight gain, cough, hemoptysis or wheezing.   Past Medical History:  Diagnosis Date  . CVA (cerebral vascular accident) ([place]) 05/04/2015   L cerebellar, "only problem I have is w/balance" 05/05/2015)  . Hypertension   . Type II diabetes mellitus ([place]) dx'd 05/05/2015    Past Surgical History:  Procedure Laterality Date  . Cataract Right 2018  . EP  IMPLANTABLE DEVICE N/A 05/07/2015   Procedure: Loop Recorder Insertion;  Surgeon: Sanda Klein, MD;  Location: [facility];  Service: Cardiovascular;  Laterality: N/A;  . NO PAST SURGERIES    . TEE WITHOUT CARDIOVERSION N/A 05/07/2015   Procedure: TRANSESOPHAGEAL ECHOCARDIOGRAM (TEE);  Surgeon: Skeet Latch, MD;  Location: [facility] ENDOSCOPY;  Service: Cardiovascular;  Laterality: N/A;    Current Medications: Outpatient Medications Prior to Visit  Medication Sig Dispense Refill  . aspirin EC 325 MG tablet Take 1 tablet (325 mg total) by mouth daily. 30 tablet 0  . atorvastatin (LIPITOR) 20 MG tablet Take 1 tablet (20 mg total) by mouth daily at 6 PM. 90 tablet 3  . glipiZIDE (GLUCOTROL) 10 MG tablet TAKE 1 TABLET (10 MG TOTAL) BY MOUTH 2 (TWO) TIMES DAILY BEFORE A MEAL. TAKE BEFORE EVENING MEAL 180 tablet 1  . glucose blood (ONETOUCH VERIO) test strip Use as instructed to check blood sugar once a day 100 each 12  . lisinopril (PRINIVIL,ZESTRIL) 20 MG tablet TAKE 1 TABLET EVERY DAY 90 tablet 3  . metFORMIN (GLUCOPHAGE) 500 MG tablet Take 0.5 tablets by mouth 2 (two) times daily.  1   No facility-administered medications prior to visit.      Allergies:   Penicillins   Social History   Socioeconomic History  . Marital status: Married    Spouse name: None  . Number of children: None  . Years of education: None  . Highest education level: None  Social Needs  . Financial resource strain: None  . Food insecurity - worry: None  .  Food insecurity - inability: None  . Transportation needs - medical: None  . Transportation needs - non-medical: None  Occupational History  . None  Tobacco Use  . Smoking status: Former Smoker    Packs/day: 1.00    Years: 20.00    Pack years: 20.00    Types: Cigarettes    Last attempt to quit: 05/21/1974    Years since quitting: 43.3  . Smokeless tobacco: Never Used  . Tobacco comment: quit smoking in the 1970's  Substance and Sexual Activity  .  Alcohol use: No    Alcohol/week: 0.0 oz  . Drug use: No  . Sexual activity: Not Currently  Other Topics Concern  . None  Social History Narrative   Employed as a Therapist, music.    Married for 30 years   Has two daughters who live locally.      Family History:  The patient's family history includes Colon cancer in his brother; Heart disease in his brother and mother.   ROS:   Please see the history of present illness.    ROS All other systems reviewed and are negative.   PHYSICAL EXAM:   VS:  BP 140/84   Pulse 74   Ht 5\' 11"  (1.803 m)   Wt 241 lb (109.3 kg)   BMI 33.61 kg/m     General: Alert, oriented x3, no distress, moderately obese, but also appears muscular and fit Head: no evidence of trauma, PERRL, EOMI, no exophtalmos or lid lag, no myxedema, no xanthelasma; normal ears, nose and oropharynx Neck: normal jugular venous pulsations and no hepatojugular reflux; brisk carotid pulses without delay and no carotid bruits Chest: clear to auscultation, no signs of consolidation by percussion or palpation, normal fremitus, symmetrical and full respiratory excursions Cardiovascular: normal position and quality of the apical impulse, regular rhythm, normal first and second heart sounds, no murmurs, rubs or gallops Abdomen: no tenderness or distention, no masses by palpation, no abnormal pulsatility or arterial bruits, normal bowel sounds, no hepatosplenomegaly Extremities: no clubbing, cyanosis or edema; 2+ radial, ulnar and brachial pulses bilaterally; 2+ right femoral, posterior tibial and dorsalis pedis pulses; 2+ left femoral, posterior tibial and dorsalis pedis pulses; no subclavian or femoral bruits Neurological: grossly nonfocal Psych: Normal mood and affect   Wt Readings from Last 3 Encounters:  09/12/17 241 lb (109.3 kg)  07/08/17 242 lb (109.8 kg)  06/28/17 241 lb (109.3 kg)      Studies/Labs Reviewed:   EKG:  EKG is ordered today.  The ekg ordered today  demonstrates Normal sinus rhythm with left axis deviation, no ischemic changes.  Recent Labs: 06/28/2017: ALT 29; BUN 10; Creatinine, Ser 0.91; Hemoglobin 13.7; Platelets 208.0; Potassium 4.3; Sodium 141; TSH 0.91   Lipid Panel    Component Value Date/Time   CHOL 127 06/28/2017 0802   TRIG 112.0 06/28/2017 0802   HDL 31.90 (L) 06/28/2017 0802   CHOLHDL 4 06/28/2017 0802   VLDL 22.4 06/28/2017 0802   LDLCALC 73 06/28/2017 0802   LDLDIRECT 182.2 08/05/2010 0838    ASSESSMENT:    1. Cryptogenic stroke ([place])   2. Encounter for loop recorder check   3. Essential hypertension   4. Mixed hyperlipidemia   5. Controlled type 2 diabetes mellitus with other circulatory complication, without long-term current use of insulin ([place])   6. Moderate obesity      PLAN:  In order of problems listed above:  1. History of cryptogenic stroke: on ASA. 2. ILR: Normal device function.  No arrhythmia that could explain the stroke, but very rare paroxysmal atrial tachycardia. He would like the device removed. We'll schedule him for explantation next month. This procedure has been fully reviewed with the patient and informed consent has been obtained. 3. HTN: excellent control 4. HLP: Excellent LDL level on statin, persistently low HDL will not improve without weight loss 5. DM: last A1c 6.7%.  6. Obesity: he should still try to lose weight    Medication Adjustments/Labs and Tests Ordered: Current medicines are reviewed at length with the patient today.  Concerns regarding medicines are outlined above.  Medication changes, Labs and Tests ordered today are listed in the Patient Instructions below. Patient Instructions  Dr Sallyanne Kuster recommends that you have your loop recorder taken out. This will be performed at [facility] in [place]. Please arrive to the hospital no later than 1:00p.    Go to the [facility] located at [place], Entrance 'A.' Free valet parking  service is available.   You do not need to be fasting.   You do not need pre-procedure lab work or testing.   Please bring your insurance cards and a list of your medications with you.   Wash your chest and neck with the surgical soap provided the evening before and the morning of your procedure. Please following the washing instructions provided (see 'Preparing for Surgery').   There aren't any restrictions after the procedure. You will receive wound care instructions upon discharge.   If you have any questions after you get home, please call the office at [phone].   Thank you,  Dr Dani Gobble [place]   *Special Note: Every effort is made to have your procedure done on time. Occasionally there are emergencies that present themselves at the hospital that may cause delays. Please be patient if a delay does occur.      Signed, Sanda Klein, MD  09/12/2017 11:06 AM    [facility] [place], [place], [place]  [address] Phone: [phone]; Fax: [phone]

## 2017-09-12 NOTE — H&P (View-Only) (Signed)
Cardiology Office Note    Date:  09/12/2017   ID:  Chaddrick, Brue 1946/01/01, MRN [id number]  PCP:  Dorothyann Peng, NP  Cardiologist:   Sanda Klein, MD   Chief Complaint  Patient presents with  . Follow-up    1 yr    History of Present Illness:  Trevor Wheeler is a 71 y.o. male with cryptogenic stroke, hypertension, DM and hyperlipidemia returning for follow-up.   An implantable loop recorder was placed when he had the left cerebellar stroke in August 2016 but has not recorded atrial fibrillation. A few episodes of brief paroxysmal atrial tachycardia have been recorded, none in over a year. He was enrolled in a epigastrium versus aspirin trial which has since ended. He is now on open label aspirin.   Interrogation of his loop recorder today shows no evidence of significant arrhythmia. He had a single nocturnal pause of almost 3 seconds that occurred in a pattern suggestive of vagal bradycardia, 03/21/2017. He thinks he was probably asleep at the time.  His blood pressure is slightly elevated today, but he checks it regularly at home and is consistently in the 130s/60s or lower. He remains physically active, although he is not walking daily anymore. He does fairly intense physical work at times including remodeling his neighbors bathroom and doing all his lawn work.  The patient specifically denies any chest pain at rest exertion, dyspnea at rest or with exertion, orthopnea, paroxysmal nocturnal dyspnea, syncope, palpitations, focal neurological deficits, intermittent claudication, lower extremity edema, unexplained weight gain, cough, hemoptysis or wheezing.   Past Medical History:  Diagnosis Date  . CVA (cerebral vascular accident) ([place]) 05/04/2015   L cerebellar, "only problem I have is w/balance" 05/05/2015)  . Hypertension   . Type II diabetes mellitus ([place]) dx'd 05/05/2015    Past Surgical History:  Procedure Laterality Date  . Cataract Right 2018  . EP  IMPLANTABLE DEVICE N/A 05/07/2015   Procedure: Loop Recorder Insertion;  Surgeon: Sanda Klein, MD;  Location: [facility];  Service: Cardiovascular;  Laterality: N/A;  . NO PAST SURGERIES    . TEE WITHOUT CARDIOVERSION N/A 05/07/2015   Procedure: TRANSESOPHAGEAL ECHOCARDIOGRAM (TEE);  Surgeon: Skeet Latch, MD;  Location: [facility] ENDOSCOPY;  Service: Cardiovascular;  Laterality: N/A;    Current Medications: Outpatient Medications Prior to Visit  Medication Sig Dispense Refill  . aspirin EC 325 MG tablet Take 1 tablet (325 mg total) by mouth daily. 30 tablet 0  . atorvastatin (LIPITOR) 20 MG tablet Take 1 tablet (20 mg total) by mouth daily at 6 PM. 90 tablet 3  . glipiZIDE (GLUCOTROL) 10 MG tablet TAKE 1 TABLET (10 MG TOTAL) BY MOUTH 2 (TWO) TIMES DAILY BEFORE A MEAL. TAKE BEFORE EVENING MEAL 180 tablet 1  . glucose blood (ONETOUCH VERIO) test strip Use as instructed to check blood sugar once a day 100 each 12  . lisinopril (PRINIVIL,ZESTRIL) 20 MG tablet TAKE 1 TABLET EVERY DAY 90 tablet 3  . metFORMIN (GLUCOPHAGE) 500 MG tablet Take 0.5 tablets by mouth 2 (two) times daily.  1   No facility-administered medications prior to visit.      Allergies:   Penicillins   Social History   Socioeconomic History  . Marital status: Married    Spouse name: None  . Number of children: None  . Years of education: None  . Highest education level: None  Social Needs  . Financial resource strain: None  . Food insecurity - worry: None  .  Food insecurity - inability: None  . Transportation needs - medical: None  . Transportation needs - non-medical: None  Occupational History  . None  Tobacco Use  . Smoking status: Former Smoker    Packs/day: 1.00    Years: 20.00    Pack years: 20.00    Types: Cigarettes    Last attempt to quit: 05/21/1974    Years since quitting: 43.3  . Smokeless tobacco: Never Used  . Tobacco comment: quit smoking in the 1970's  Substance and Sexual Activity  .  Alcohol use: No    Alcohol/week: 0.0 oz  . Drug use: No  . Sexual activity: Not Currently  Other Topics Concern  . None  Social History Narrative   Employed as a Therapist, music.    Married for 30 years   Has two daughters who live locally.      Family History:  The patient's family history includes Colon cancer in his brother; Heart disease in his brother and mother.   ROS:   Please see the history of present illness.    ROS All other systems reviewed and are negative.   PHYSICAL EXAM:   VS:  BP 140/84   Pulse 74   Ht 5\' 11"  (1.803 m)   Wt 241 lb (109.3 kg)   BMI 33.61 kg/m     General: Alert, oriented x3, no distress, moderately obese, but also appears muscular and fit Head: no evidence of trauma, PERRL, EOMI, no exophtalmos or lid lag, no myxedema, no xanthelasma; normal ears, nose and oropharynx Neck: normal jugular venous pulsations and no hepatojugular reflux; brisk carotid pulses without delay and no carotid bruits Chest: clear to auscultation, no signs of consolidation by percussion or palpation, normal fremitus, symmetrical and full respiratory excursions Cardiovascular: normal position and quality of the apical impulse, regular rhythm, normal first and second heart sounds, no murmurs, rubs or gallops Abdomen: no tenderness or distention, no masses by palpation, no abnormal pulsatility or arterial bruits, normal bowel sounds, no hepatosplenomegaly Extremities: no clubbing, cyanosis or edema; 2+ radial, ulnar and brachial pulses bilaterally; 2+ right femoral, posterior tibial and dorsalis pedis pulses; 2+ left femoral, posterior tibial and dorsalis pedis pulses; no subclavian or femoral bruits Neurological: grossly nonfocal Psych: Normal mood and affect   Wt Readings from Last 3 Encounters:  09/12/17 241 lb (109.3 kg)  07/08/17 242 lb (109.8 kg)  06/28/17 241 lb (109.3 kg)      Studies/Labs Reviewed:   EKG:  EKG is ordered today.  The ekg ordered today  demonstrates Normal sinus rhythm with left axis deviation, no ischemic changes.  Recent Labs: 06/28/2017: ALT 29; BUN 10; Creatinine, Ser 0.91; Hemoglobin 13.7; Platelets 208.0; Potassium 4.3; Sodium 141; TSH 0.91   Lipid Panel    Component Value Date/Time   CHOL 127 06/28/2017 0802   TRIG 112.0 06/28/2017 0802   HDL 31.90 (L) 06/28/2017 0802   CHOLHDL 4 06/28/2017 0802   VLDL 22.4 06/28/2017 0802   LDLCALC 73 06/28/2017 0802   LDLDIRECT 182.2 08/05/2010 0838    ASSESSMENT:    1. Cryptogenic stroke ([place])   2. Encounter for loop recorder check   3. Essential hypertension   4. Mixed hyperlipidemia   5. Controlled type 2 diabetes mellitus with other circulatory complication, without long-term current use of insulin ([place])   6. Moderate obesity      PLAN:  In order of problems listed above:  1. History of cryptogenic stroke: on ASA. 2. ILR: Normal device function.  No arrhythmia that could explain the stroke, but very rare paroxysmal atrial tachycardia. He would like the device removed. We'll schedule him for explantation next month. This procedure has been fully reviewed with the patient and informed consent has been obtained. 3. HTN: excellent control 4. HLP: Excellent LDL level on statin, persistently low HDL will not improve without weight loss 5. DM: last A1c 6.7%.  6. Obesity: he should still try to lose weight    Medication Adjustments/Labs and Tests Ordered: Current medicines are reviewed at length with the patient today.  Concerns regarding medicines are outlined above.  Medication changes, Labs and Tests ordered today are listed in the Patient Instructions below. Patient Instructions  Dr Sallyanne Kuster recommends that you have your loop recorder taken out. This will be performed at [facility] in [place]. Please arrive to the hospital no later than 1:00p.    Go to the [facility] located at [place], Entrance 'A.' Free valet parking  service is available.   You do not need to be fasting.   You do not need pre-procedure lab work or testing.   Please bring your insurance cards and a list of your medications with you.   Wash your chest and neck with the surgical soap provided the evening before and the morning of your procedure. Please following the washing instructions provided (see 'Preparing for Surgery').   There aren't any restrictions after the procedure. You will receive wound care instructions upon discharge.   If you have any questions after you get home, please call the office at [phone].   Thank you,  Dr Dani Gobble [facility] [place]   *Special Note: Every effort is made to have your procedure done on time. Occasionally there are emergencies that present themselves at the hospital that may cause delays. Please be patient if a delay does occur.      Signed, Sanda Klein, MD  09/12/2017 11:06 AM    [facility] [place], [place],   [address] Phone: [phone]; Fax: [phone]

## 2017-09-26 ENCOUNTER — Ambulatory Visit (INDEPENDENT_AMBULATORY_CARE_PROVIDER_SITE_OTHER): Payer: Medicare Other | Admitting: *Deleted

## 2017-09-26 DIAGNOSIS — I639 Cerebral infarction, unspecified: Secondary | ICD-10-CM | POA: Diagnosis not present

## 2017-09-26 NOTE — Progress Notes (Signed)
Carelink Summary Report / Loop Recorder 

## 2017-09-27 ENCOUNTER — Telehealth: Payer: Self-pay | Admitting: Cardiology

## 2017-09-27 NOTE — Telephone Encounter (Signed)
Attempted to call pt b/c his home monitor has not updated in at least 14 days. No answer and unable to leave a message.  

## 2017-09-28 ENCOUNTER — Ambulatory Visit (HOSPITAL_COMMUNITY)
Admission: RE | Admit: 2017-09-28 | Discharge: 2017-09-28 | Disposition: A | Discharge status: Home / Self Care | Payer: Medicare Other | Source: Ambulatory Visit | Attending: Cardiovascular Disease | Admitting: Cardiovascular Disease

## 2017-09-28 ENCOUNTER — Encounter (HOSPITAL_COMMUNITY)
Admission: RE | Disposition: A | Discharge status: Home / Self Care | Payer: Self-pay | Source: Ambulatory Visit | Attending: Cardiovascular Disease

## 2017-09-28 DIAGNOSIS — Z7982 Long term (current) use of aspirin: Secondary | ICD-10-CM | POA: Diagnosis not present

## 2017-09-28 DIAGNOSIS — Z45018 Encounter for adjustment and management of other part of cardiac pacemaker: Secondary | ICD-10-CM | POA: Insufficient documentation

## 2017-09-28 DIAGNOSIS — I639 Cerebral infarction, unspecified: Secondary | ICD-10-CM | POA: Diagnosis not present

## 2017-09-28 DIAGNOSIS — E782 Mixed hyperlipidemia: Secondary | ICD-10-CM | POA: Diagnosis not present

## 2017-09-28 DIAGNOSIS — Z8673 Personal history of transient ischemic attack (TIA), and cerebral infarction without residual deficits: Secondary | ICD-10-CM | POA: Insufficient documentation

## 2017-09-28 DIAGNOSIS — Z4509 Encounter for adjustment and management of other cardiac device: Secondary | ICD-10-CM | POA: Diagnosis not present

## 2017-09-28 DIAGNOSIS — E119 Type 2 diabetes mellitus without complications: Secondary | ICD-10-CM | POA: Insufficient documentation

## 2017-09-28 DIAGNOSIS — Z87891 Personal history of nicotine dependence: Secondary | ICD-10-CM | POA: Insufficient documentation

## 2017-09-28 DIAGNOSIS — I10 Essential (primary) hypertension: Secondary | ICD-10-CM | POA: Insufficient documentation

## 2017-09-28 DIAGNOSIS — Z7984 Long term (current) use of oral hypoglycemic drugs: Secondary | ICD-10-CM | POA: Insufficient documentation

## 2017-09-28 DIAGNOSIS — E669 Obesity, unspecified: Secondary | ICD-10-CM | POA: Insufficient documentation

## 2017-09-28 DIAGNOSIS — Z79899 Other long term (current) drug therapy: Secondary | ICD-10-CM | POA: Insufficient documentation

## 2017-09-28 DIAGNOSIS — Z6833 Body mass index (BMI) 33.0-33.9, adult: Secondary | ICD-10-CM | POA: Diagnosis not present

## 2017-09-28 HISTORY — PX: LOOP RECORDER REMOVAL: EP1215

## 2017-09-28 LAB — GLUCOSE, CAPILLARY: GLUCOSE-CAPILLARY: 119 mg/dL — AB (ref 65–99)

## 2017-09-28 SURGERY — LOOP RECORDER REMOVAL

## 2017-09-28 MED ORDER — LIDOCAINE-EPINEPHRINE 1 %-1:100000 IJ SOLN
INTRAMUSCULAR | Status: DC | PRN
  Administered 2017-09-28: 20 mL

## 2017-09-28 MED ORDER — LIDOCAINE-EPINEPHRINE 1 %-1:100000 IJ SOLN
INTRAMUSCULAR | Status: AC
  Filled 2017-09-28: qty 1

## 2017-09-28 SURGICAL SUPPLY — 1 items: PACK LOOP INSERTION (CUSTOM PROCEDURE TRAY) ×3 IMPLANT

## 2017-09-28 NOTE — Interval H&P Note (Signed)
History and Physical Interval Note:  09/28/2017 12:45 PM  Trevor Wheeler  has presented today for surgery, with the diagnosis of eol  The various methods of treatment have been discussed with the patient and family. After consideration of risks, benefits and other options for treatment, the patient has consented to  Procedure(s): LOOP RECORDER REMOVAL (N/A) as a surgical intervention .  The patient's history has been reviewed, patient examined, no change in status, stable for surgery.  I have reviewed the patient's chart and labs.  Questions were answered to the patient's satisfaction.     [name]

## 2017-09-28 NOTE — Op Note (Signed)
Loop recorder has reached limit of usefulness. No atrial fibrillation in >2 years of monitoring. Explanted at patient request.   [place]    Reason for procedure:  1. Loop recorder at end of service 2. Cryptogenic stroke Procedure performed by:  Sanda Klein, MD  Complications:  None  Estimated blood loss:  <5 mL  Medications administered during procedure:  Lidocaine 1% with 1/10,000 epinephrine 10 mL locally  Procedure details:  After the risks and benefits of the procedure were discussed the patient provided informed consent. The patient was prepped and draped in usual sterile fashion. Local anesthesia was administered to an area 2 cm to the left of the sternum in the 4th intercostal space. A 1 cm cutaneous incision was made using a scalpel and the device was easily explanted with a hemostat. Local pressure was held to ensure hemostasis.  The incision was closed with SteriStrips and a sterile dressing was applied.   Sanda Klein, MD, [place] [phone] office [phone] pager 09/28/2017 2:28 PM

## 2017-09-29 ENCOUNTER — Encounter (HOSPITAL_COMMUNITY): Payer: Self-pay | Admitting: Cardiovascular Disease

## 2017-10-06 ENCOUNTER — Encounter: Payer: Self-pay | Admitting: Cardiology

## 2017-10-07 LAB — CUP PACEART REMOTE DEVICE CHECK
Date Time Interrogation Session: 20190105124056
MDC IDC PG IMPLANT DT: 20160817

## 2017-10-11 ENCOUNTER — Telehealth: Payer: Self-pay | Admitting: Cardiovascular Disease

## 2017-10-11 NOTE — Telephone Encounter (Signed)
Spoke with pt informed pt that a pre-paid return kit would be mailed to him  

## 2017-10-11 NOTE — Telephone Encounter (Signed)
New message   Patient calling stating device was remove at [facility]  - last week

## 2017-10-13 ENCOUNTER — Ambulatory Visit (INDEPENDENT_AMBULATORY_CARE_PROVIDER_SITE_OTHER): Payer: Self-pay | Admitting: *Deleted

## 2017-10-13 DIAGNOSIS — I639 Cerebral infarction, unspecified: Secondary | ICD-10-CM

## 2017-10-13 LAB — CUP PACEART INCLINIC DEVICE CHECK
Implantable Pulse Generator Implant Date: 20160817
MDC IDC SESS DTM: 20190124163006

## 2017-10-13 NOTE — Progress Notes (Signed)
Wound check appointment s/p LINQ explant. Steri-strips removed prior to appointment. Wound without redness or edema. Incision edges approximated, wound well healed.

## 2017-11-10 ENCOUNTER — Other Ambulatory Visit: Payer: Self-pay | Admitting: Cardiovascular Disease

## 2017-11-10 DIAGNOSIS — E785 Hyperlipidemia, unspecified: Secondary | ICD-10-CM

## 2017-11-10 DIAGNOSIS — I639 Cerebral infarction, unspecified: Secondary | ICD-10-CM

## 2017-11-26 ENCOUNTER — Other Ambulatory Visit: Payer: Self-pay | Admitting: Adult Health

## 2017-11-26 DIAGNOSIS — IMO0001 Reserved for inherently not codable concepts without codable children: Secondary | ICD-10-CM

## 2017-11-26 DIAGNOSIS — E1165 Type 2 diabetes mellitus with hyperglycemia: Principal | ICD-10-CM

## 2017-11-29 NOTE — Telephone Encounter (Signed)
Sent to the pharmacy by e-scribe for 90 days.  Pt has fu scheduled for 12/27/17

## 2017-12-27 ENCOUNTER — Encounter: Payer: Self-pay | Admitting: Adult Health

## 2017-12-27 ENCOUNTER — Ambulatory Visit: Payer: Medicare Other | Admitting: Adult Health

## 2017-12-27 VITALS — BP 128/86 | Temp 98.3°F | Wt 239.0 lb

## 2017-12-27 DIAGNOSIS — E1159 Type 2 diabetes mellitus with other circulatory complications: Secondary | ICD-10-CM | POA: Diagnosis not present

## 2017-12-27 LAB — POCT GLYCOSYLATED HEMOGLOBIN (HGB A1C): Hemoglobin A1C: 6.8

## 2017-12-27 NOTE — Progress Notes (Signed)
Subjective:    Patient ID: Trevor Wheeler, male    DOB: 31-Jan-1946, 72 y.o.   MRN: [id number]  Diabetes  He presents for his follow-up diabetic visit. He has type 2 diabetes mellitus. His disease course has been stable. There are no hypoglycemic associated symptoms. Pertinent negatives for diabetes include no blurred vision, no fatigue, no polydipsia, no polyphagia, no polyuria, no visual change, no weakness and no weight loss. There are no hypoglycemic complications. Diabetic complications include heart disease. Risk factors for coronary artery disease include hypertension, male sex and diabetes mellitus. Current diabetic treatment includes oral agent (dual therapy) and diet (Glipizide and Metformin ). He is compliant with treatment all of the time. His weight is stable. He is following a generally healthy diet. Meal planning includes avoidance of concentrated sweets. He has not had a previous visit with a dietitian. He participates in exercise intermittently. There is no change in his home blood glucose trend. His overall blood glucose range is 140-180 mg/dl. An ACE inhibitor/angiotensin II receptor blocker is being taken. He does not see a podiatrist.Eye exam is current.   He reports that he had his Loop Recorder was removed in January.   Review of Systems  Constitutional: Negative for fatigue and weight loss.  Eyes: Negative for blurred vision.  Respiratory: Negative.   Cardiovascular: Negative.   Endocrine: Negative for polydipsia, polyphagia and polyuria.  Genitourinary: Negative.   Neurological: Negative for weakness.  Psychiatric/Behavioral: Negative.    Past Medical History:  Diagnosis Date  . CVA (cerebral vascular accident) ([employer]) 05/04/2015   L cerebellar, "only problem I have is w/balance" 05/05/2015)  . Hypertension   . Type II diabetes mellitus ([place]) dx'd 05/05/2015    Social History   Socioeconomic History  . Marital status: Married    Spouse name: Not on file  . Number  of children: Not on file  . Years of education: Not on file  . Highest education level: Not on file  Occupational History  . Not on file  Social Needs  . Financial resource strain: Not on file  . Food insecurity:    Worry: Not on file    Inability: Not on file  . Transportation needs:    Medical: Not on file    Non-medical: Not on file  Tobacco Use  . Smoking status: Former Smoker    Packs/day: 1.00    Years: 20.00    Pack years: 20.00    Types: Cigarettes    Last attempt to quit: 05/21/1974    Years since quitting: 43.6  . Smokeless tobacco: Never Used  . Tobacco comment: quit smoking in the 1970's  Substance and Sexual Activity  . Alcohol use: No    Alcohol/week: 0.0 oz  . Drug use: No  . Sexual activity: Not Currently  Lifestyle  . Physical activity:    Days per week: Not on file    Minutes per session: Not on file  . Stress: Not on file  Relationships  . Social connections:    Talks on phone: Not on file    Gets together: Not on file    Attends religious service: Not on file    Active member of club or organization: Not on file    Attends meetings of clubs or organizations: Not on file    Relationship status: Not on file  . Intimate partner violence:    Fear of current or ex partner: Not on file    Emotionally abused: Not on file  Physically abused: Not on file    Forced sexual activity: Not on file  Other Topics Concern  . Not on file  Social History Narrative   Employed as a Therapist, music.    Married for 30 years   Has two daughters who live locally.     Past Surgical History:  Procedure Laterality Date  . Cataract Right 2018  . EP IMPLANTABLE DEVICE N/A 05/07/2015   Procedure: Loop Recorder Insertion;  Surgeon: Sanda Klein, MD;  Location: [facility];  Service: Cardiovascular;  Laterality: N/A;  . LOOP RECORDER REMOVAL N/A 09/28/2017   Procedure: LOOP RECORDER REMOVAL;  Surgeon: Sanda Klein, MD;  Location: [facility];   Service: Cardiovascular;  Laterality: N/A;  . NO PAST SURGERIES    . TEE WITHOUT CARDIOVERSION N/A 05/07/2015   Procedure: TRANSESOPHAGEAL ECHOCARDIOGRAM (TEE);  Surgeon: Skeet Latch, MD;  Location: [facility] ENDOSCOPY;  Service: Cardiovascular;  Laterality: N/A;    Family History  Problem Relation Age of Onset  . Heart disease Mother   . Colon cancer Brother   . Heart disease Brother     Allergies  Allergen Reactions  . Penicillins Hives    Has patient had a PCN reaction causing immediate rash, facial/tongue/throat swelling, SOB or lightheadedness with hypotension: No Has patient had a PCN reaction causing severe rash involving mucus membranes or skin necrosis: No Has patient had a PCN reaction that required hospitalization: No Has patient had a PCN reaction occurring within the last 10 years: No If all of the above answers are "NO", then may proceed with Cephalosporin use.     Current Outpatient Medications on File Prior to Visit  Medication Sig Dispense Refill  . aspirin EC 325 MG tablet Take 1 tablet (325 mg total) by mouth daily. 30 tablet 0  . atorvastatin (LIPITOR) 20 MG tablet TAKE 1 TABLET (20 MG TOTAL) BY MOUTH DAILY AT 6 PM. 90 tablet 1  . glipiZIDE (GLUCOTROL) 10 MG tablet TAKE 1 TABLET (10 MG TOTAL) BY MOUTH 2 (TWO) TIMES DAILY BEFORE A MEAL. TAKE BEFORE EVENING MEAL 180 tablet 1  . glucose blood (ONETOUCH VERIO) test strip Use as instructed to check blood sugar once a day 100 each 12  . lisinopril (PRINIVIL,ZESTRIL) 20 MG tablet TAKE 1 TABLET EVERY DAY 90 tablet 3  . metFORMIN (GLUCOPHAGE) 500 MG tablet Take 250 mg by mouth 2 (two) times daily.   1  . metFORMIN (GLUCOPHAGE) 500 MG tablet TAKE 0.5 TABLETS (250 MG TOTAL) BY MOUTH 2 (TWO) TIMES DAILY WITH A MEAL. 90 tablet 0  . naproxen sodium (ALEVE) 220 MG tablet Take 220 mg by mouth 2 (two) times daily as needed (for knee pain.).     No current facility-administered medications on file prior to visit.     There were  no vitals taken for this visit.      Objective:   Physical Exam  Constitutional: He is oriented to person, place, and time. He appears well-developed and well-nourished. No distress.  Cardiovascular: Normal rate, regular rhythm, normal heart sounds and intact distal pulses. Exam reveals no friction rub.  No murmur heard. Pulmonary/Chest: Effort normal and breath sounds normal. No respiratory distress. He has no wheezes. He has no rales. He exhibits no tenderness.  Neurological: He is alert and oriented to person, place, and time.  Skin: Skin is warm and dry. No rash noted. He is not diaphoretic. No erythema. No pallor.  Psychiatric: He has a normal mood and affect. His  behavior is normal. Judgment and thought content normal.  Nursing note and vitals reviewed.     Assessment & Plan:  1. Controlled type 2 diabetes mellitus with other circulatory complication, without long-term current use of insulin ([facility]) - POCT A1C- 6.8  - Has increased slightly.  - will have him take Metformin 250 mg in the am and 500 mg in the pm.  - Follow up at his [facility], NP

## 2018-02-20 ENCOUNTER — Other Ambulatory Visit: Payer: Self-pay | Admitting: Adult Health

## 2018-02-20 DIAGNOSIS — IMO0001 Reserved for inherently not codable concepts without codable children: Secondary | ICD-10-CM

## 2018-02-20 DIAGNOSIS — E1165 Type 2 diabetes mellitus with hyperglycemia: Principal | ICD-10-CM

## 2018-02-21 NOTE — Telephone Encounter (Signed)
Assessment & Plan:  1. Controlled type 2 diabetes mellitus with other circulatory complication, without long-term current use of insulin ([facility]) - POCT A1C- 6.8  - Has increased slightly.  - will have him take Metformin 250 mg in the am and 500 mg in the pm.  - Follow up at his [place], NP   Sent to the pharmacy by e-scribe with new instructions.

## 2018-04-14 ENCOUNTER — Other Ambulatory Visit: Payer: Self-pay | Admitting: Adult Health

## 2018-04-14 DIAGNOSIS — E1159 Type 2 diabetes mellitus with other circulatory complications: Secondary | ICD-10-CM

## 2018-04-14 NOTE — Telephone Encounter (Signed)
Sent to the pharmacy by e-scribe. 

## 2018-05-03 ENCOUNTER — Other Ambulatory Visit: Payer: Self-pay | Admitting: Cardiovascular Disease

## 2018-05-03 DIAGNOSIS — E785 Hyperlipidemia, unspecified: Secondary | ICD-10-CM

## 2018-05-03 DIAGNOSIS — I639 Cerebral infarction, unspecified: Secondary | ICD-10-CM

## 2018-05-03 NOTE — Telephone Encounter (Signed)
Rx request sent to pharmacy.  

## 2018-05-19 ENCOUNTER — Other Ambulatory Visit: Payer: Self-pay | Admitting: Adult Health

## 2018-05-19 DIAGNOSIS — E1165 Type 2 diabetes mellitus with hyperglycemia: Principal | ICD-10-CM

## 2018-05-19 DIAGNOSIS — IMO0001 Reserved for inherently not codable concepts without codable children: Secondary | ICD-10-CM

## 2018-05-19 NOTE — Telephone Encounter (Signed)
Sent to the pharmacy by e-scribe for 90 days.  Pt has upcoming cpx on 07/04/18.

## 2018-06-06 ENCOUNTER — Other Ambulatory Visit: Payer: Self-pay | Admitting: Cardiovascular Disease

## 2018-06-06 ENCOUNTER — Other Ambulatory Visit: Payer: Self-pay | Admitting: Adult Health

## 2018-06-06 DIAGNOSIS — IMO0001 Reserved for inherently not codable concepts without codable children: Secondary | ICD-10-CM

## 2018-06-06 DIAGNOSIS — E1165 Type 2 diabetes mellitus with hyperglycemia: Principal | ICD-10-CM

## 2018-06-06 NOTE — Telephone Encounter (Signed)
Sent to the pharmacy by e-scribe.  Pt has upcoming cpx on 06/28/18.

## 2018-07-04 ENCOUNTER — Encounter: Payer: Self-pay | Admitting: Adult Health

## 2018-07-04 ENCOUNTER — Other Ambulatory Visit: Payer: Self-pay | Admitting: Adult Health

## 2018-07-04 ENCOUNTER — Ambulatory Visit (INDEPENDENT_AMBULATORY_CARE_PROVIDER_SITE_OTHER): Payer: Medicare Other | Admitting: Adult Health

## 2018-07-04 VITALS — BP 118/78 | HR 66 | Temp 98.2°F | Ht 70.0 in | Wt 238.7 lb

## 2018-07-04 DIAGNOSIS — Z8673 Personal history of transient ischemic attack (TIA), and cerebral infarction without residual deficits: Secondary | ICD-10-CM

## 2018-07-04 DIAGNOSIS — Z125 Encounter for screening for malignant neoplasm of prostate: Secondary | ICD-10-CM

## 2018-07-04 DIAGNOSIS — I10 Essential (primary) hypertension: Secondary | ICD-10-CM

## 2018-07-04 DIAGNOSIS — E782 Mixed hyperlipidemia: Secondary | ICD-10-CM | POA: Diagnosis not present

## 2018-07-04 DIAGNOSIS — E1165 Type 2 diabetes mellitus with hyperglycemia: Principal | ICD-10-CM

## 2018-07-04 DIAGNOSIS — E1159 Type 2 diabetes mellitus with other circulatory complications: Secondary | ICD-10-CM

## 2018-07-04 DIAGNOSIS — B351 Tinea unguium: Secondary | ICD-10-CM

## 2018-07-04 DIAGNOSIS — Z1211 Encounter for screening for malignant neoplasm of colon: Secondary | ICD-10-CM

## 2018-07-04 DIAGNOSIS — Z0001 Encounter for general adult medical examination with abnormal findings: Secondary | ICD-10-CM | POA: Diagnosis not present

## 2018-07-04 DIAGNOSIS — I639 Cerebral infarction, unspecified: Secondary | ICD-10-CM

## 2018-07-04 DIAGNOSIS — IMO0001 Reserved for inherently not codable concepts without codable children: Secondary | ICD-10-CM

## 2018-07-04 DIAGNOSIS — Z0000 Encounter for general adult medical examination without abnormal findings: Secondary | ICD-10-CM

## 2018-07-04 LAB — CBC WITH DIFFERENTIAL/PLATELET
BASOS ABS: 0.1 10*3/uL (ref 0.0–0.1)
Basophils Relative: 0.8 % (ref 0.0–3.0)
EOS ABS: 0.2 10*3/uL (ref 0.0–0.7)
Eosinophils Relative: 2.4 % (ref 0.0–5.0)
HCT: 43.3 % (ref 39.0–52.0)
Hemoglobin: 13.9 g/dL (ref 13.0–17.0)
Lymphocytes Relative: 35.6 % (ref 12.0–46.0)
Lymphs Abs: 3.1 10*3/uL (ref 0.7–4.0)
MCHC: 32.2 g/dL (ref 30.0–36.0)
MCV: 85 fl (ref 78.0–100.0)
Monocytes Absolute: 0.7 10*3/uL (ref 0.1–1.0)
Monocytes Relative: 7.9 % (ref 3.0–12.0)
NEUTROS ABS: 4.7 10*3/uL (ref 1.4–7.7)
Neutrophils Relative %: 53.3 % (ref 43.0–77.0)
PLATELETS: 209 10*3/uL (ref 150.0–400.0)
RBC: 5.09 Mil/uL (ref 4.22–5.81)
RDW: 14.1 % (ref 11.5–15.5)
WBC: 8.8 10*3/uL (ref 4.0–10.5)

## 2018-07-04 LAB — TSH: TSH: 0.78 u[IU]/mL (ref 0.35–4.50)

## 2018-07-04 LAB — BASIC METABOLIC PANEL
BUN: 14 mg/dL (ref 6–23)
CALCIUM: 9.6 mg/dL (ref 8.4–10.5)
CO2: 29 meq/L (ref 19–32)
CREATININE: 0.91 mg/dL (ref 0.40–1.50)
Chloride: 105 mEq/L (ref 96–112)
GFR: 105.5 mL/min (ref >60.00)
GLUCOSE: 145 mg/dL — AB (ref 70–99)
Potassium: 4.6 mEq/L (ref 3.5–5.1)
Sodium: 141 mEq/L (ref 135–145)

## 2018-07-04 LAB — LIPID PANEL
CHOL/HDL RATIO: 4
Cholesterol: 127 mg/dL (ref 0–200)
HDL: 34.2 mg/dL — AB (ref >39.00)
LDL Cholesterol: 78 mg/dL (ref 0–99)
NonHDL: 93.13
TRIGLYCERIDES: 77 mg/dL (ref 0.0–149.0)
VLDL: 15.4 mg/dL (ref 0.0–40.0)

## 2018-07-04 LAB — HEPATIC FUNCTION PANEL
ALT: 30 U/L (ref 0–53)
AST: 26 U/L (ref 0–37)
Albumin: 4.3 g/dL (ref 3.5–5.2)
Alkaline Phosphatase: 88 U/L (ref 39–117)
BILIRUBIN DIRECT: 0.2 mg/dL (ref 0.0–0.3)
BILIRUBIN TOTAL: 0.9 mg/dL (ref 0.2–1.2)
Total Protein: 6.9 g/dL (ref 6.0–8.3)

## 2018-07-04 LAB — PSA: PSA: 1.12 ng/mL (ref 0.10–4.00)

## 2018-07-04 LAB — HEMOGLOBIN A1C: HEMOGLOBIN A1C: 6.9 % — AB (ref 4.6–6.5)

## 2018-07-04 MED ORDER — TERBINAFINE HCL 250 MG PO TABS: 250.0000 mg | ORAL_TABLET | Freq: Every day | ORAL | 0 refills | Status: DC

## 2018-07-04 MED ORDER — GLIPIZIDE 10 MG PO TABS: 10.0000 mg | ORAL_TABLET | Freq: Two times a day (BID) | ORAL | 1 refills | Status: DC

## 2018-07-04 NOTE — Progress Notes (Signed)
Subjective:    Patient ID: Trevor Wheeler, male    DOB: February 19, 1946, 72 y.o.   MRN: [id number]  HPI  Patient presents for yearly preventative medicine examination. He is a pleasant 72 year old male who  has a past medical history of CVA (cerebral vascular accident) ([place]) (05/04/2015), Hypertension, and Type II diabetes mellitus ([place]) (dx'd 05/05/2015).   DM - Controlled with Glipizide 10 mg and Metformin 250 mg BID  Lab Results  Component Value Date   HGBA1C 6.8 12/27/2017   He takes Glipizide 10 mg BID and Metformin 250 mg in the am and 500 mg in the pm.  RecentLabs       Lab Results  Component Value Date   HGBA1C 6.7 02/22/2017     Hyperlipidemia - takes Lipitor 20 mg. Denies any myalgias Lab Results  Component Value Date   CHOL 127 06/28/2017   HDL 31.90 (L) 06/28/2017   LDLCALC 73 06/28/2017   LDLDIRECT 182.2 08/05/2010   TRIG 112.0 06/28/2017   CHOLHDL 4 06/28/2017   Hypertension - takes lisinopril 20 mg daily. Denies lightheadedness, dizziness, headaches, or blurred vision  BP Readings from Last 3 Encounters:  07/04/18 118/78  12/27/17 128/86  09/28/17 (!) 143/75    H/o CVA - is followed with Cardiology on a yearly basis.   All immunizations and health maintenance protocols were reviewed with the patient and needed orders were placed. He is due for yearly flu vaccination  Appropriate screening laboratory values were ordered for the patient including screening of hyperlipidemia, renal function and hepatic function. If indicated by BPH, a PSA was ordered.  Medication reconciliation,  past medical history, social history, problem list and allergies were reviewed in detail with the patient  Goals were established with regard to weight loss, exercise, and  diet in compliance with medications Wt Readings from Last 3 Encounters:  07/04/18 238 lb 11.2 oz (108.3 kg)  12/27/17 239 lb (108.4 kg)  09/28/17 241 lb (109.3 kg)   End of life planning was discussed. He has  an advanced directive and living will.   He denies any depression.   He has had his vision screen this year. He is due for his colonoscopy.   He has no acute complaints.   Review of Systems  Constitutional: Negative.   HENT: Negative.   Eyes: Negative.   Respiratory: Negative.   Cardiovascular: Negative.   Gastrointestinal: Negative.   Endocrine: Negative.   Genitourinary: Negative.   Musculoskeletal: Negative.   Skin: Negative.   Allergic/Immunologic: Negative.   Neurological: Negative.   Hematological: Negative.   Psychiatric/Behavioral: Negative.   All other systems reviewed and are negative.      Objective:   Physical Exam  Constitutional: He is oriented to person, place, and time. He appears well-developed and well-nourished. No distress.  HENT:  Head: Normocephalic and atraumatic.  Right Ear: External ear normal.  Left Ear: External ear normal.  Nose: Nose normal.  Mouth/Throat: Oropharynx is clear and moist. No oropharyngeal exudate.  Eyes: Pupils are equal, round, and reactive to light. Conjunctivae and EOM are normal. Right eye exhibits no discharge. Left eye exhibits no discharge. No scleral icterus.  Neck: Normal range of motion. Neck supple. No JVD present. No tracheal deviation present. No thyromegaly present.  Cardiovascular: Normal rate, regular rhythm, normal heart sounds and intact distal pulses. Exam reveals no gallop and no friction rub.  No murmur heard. Pulmonary/Chest: Effort normal and breath sounds normal. No stridor. No respiratory distress. He  has no wheezes. He has no rales. He exhibits no tenderness.  Abdominal: Soft. Bowel sounds are normal. He exhibits no distension and no mass. There is no tenderness. There is no rebound and no guarding. No hernia.  Genitourinary:  Genitourinary Comments: Will do PSA    Musculoskeletal: Normal range of motion. He exhibits no edema, tenderness or deformity.  Lymphadenopathy:    He has no cervical  adenopathy.  Neurological: He is alert and oriented to person, place, and time. He displays normal reflexes. No cranial nerve deficit or sensory deficit. He exhibits normal muscle tone. Coordination normal.  Skin: Skin is warm and dry. Capillary refill takes less than 2 seconds. No rash noted. He is not diaphoretic. No erythema. No pallor.  Toenail fungus on all digits of bilateral feet  Psychiatric: He has a normal mood and affect. His behavior is normal. Judgment and thought content normal.  Nursing note and vitals reviewed.     Assessment & Plan:  1. Routine general medical examination at a health care facility - Encouraged heart healthy diet and execise  - Follow up in one year for next CPE or sooner if needed - Basic metabolic panel - CBC with Differential/Platelet - Hemoglobin A1c - Hepatic function panel - Lipid panel - TSH  2. Mixed hyperlipidemia - Consider increase in Lipitor  - Basic metabolic panel - CBC with Differential/Platelet - Hemoglobin A1c - Hepatic function panel - Lipid panel - TSH  3. 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 Controlled type 2 diabetes mellitus with other circulatory complication, without long-term current use of insulin ([place]) - Normally well controlled.  - Consider increasing metformin  - Basic metabolic panel - CBC with Differential/Platelet - Hemoglobin A1c - Hepatic function panel - Lipid panel - TSH  5. Essential hypertension - Well controlled. No change in medication  - Basic metabolic panel - CBC with Differential/Platelet - Hemoglobin A1c - Hepatic function panel - Lipid panel - TSH  6. Prostate cancer screening  - PSA  7. Colon cancer screening  - Ambulatory referral to Gastroenterology  8. Toenail fungus - Will check liver panel and if ok, then send in Lamisil  -  Hepatic function panel

## 2018-07-04 NOTE — Patient Instructions (Signed)
It was great seeing you today   I will follow up with you regarding your labs   Someone will call you to schedule your colonoscopy.   Continue to work on weight loss through diet and exercise   Follow up in 6 months for diabetic check

## 2018-07-10 NOTE — Progress Notes (Signed)
Subjective:   Trevor Wheeler is a 72 y.o. male who presents for Medicare Annual/Subsequent preventive examination.  Reports health as good   Psychosocial Lives with spouse 4 yo 2 girls   Just seen [place] last week    Diet  A1c 6.9  Breakfast; am oatmeal; sometimes sandwich Lunch; salad or sandwich; periodic hamburger Supper - chicken;  Eats some friend foods on average 4 times a week Vegetables  Eats salads  Grapes    BMI 33.8 but states this is his normal weight   Exercise Walks the dog x 2 miles am and pm  Takes 40 minutes  Had a garden with tomatoes  Work on cars; [place] grass  6 yards to [employer] as a hobby Re-seeding from yard- raking    Former smoker 76  20 pack years    Health Maintenance Due  Topic Date Due  . Samul Dada  02/07/1966  . COLONOSCOPY  04/26/2018   Colonoscopy due now 04/2018   Diabetic eye exam completed  [place] - done around April  No diabetic retinopathy   Hearing is still is good        Objective:    Vitals: BP 134/80   Pulse 85   Ht 5\' 11"  (1.803 m)   Wt 242 lb (109.8 kg)   SpO2 97%   BMI 33.75 kg/m   Body mass index is 33.75 kg/m.  Advanced Directives 07/11/2018 09/28/2017 07/08/2017 05/23/2015 05/05/2015  Does Patient Have a Medical Advance Directive? No No No No No  Would patient like information on creating a medical advance directive? - No - Patient declined - Yes - Educational materials given -   Has not done that yet   Tobacco Social History   Tobacco Use  Smoking Status Former Smoker  . Packs/day: 1.00  . Years: 20.00  . Pack years: 20.00  . Types: Cigarettes  . Last attempt to quit: 05/21/1974  . Years since quitting: 44.1  Smokeless Tobacco Never Used  Tobacco Comment   quit smoking in the 1970's/ educated [employer]      Counseling given: Not Answered Comment: quit smoking in the 1970's/ educated [employer]    Clinical Intake:   Past Medical History:  Diagnosis Date  . CVA (cerebral vascular  accident) ([place]) 05/04/2015   L cerebellar, "only problem I have is w/balance" 05/05/2015)  . Hypertension   . Type II diabetes mellitus ([place]) dx'd 05/05/2015   Past Surgical History:  Procedure Laterality Date  . Cataract Right 2018  . EP IMPLANTABLE DEVICE N/A 05/07/2015   Procedure: Loop Recorder Insertion;  Surgeon: Sanda Klein, MD;  Location: [place] [facility];  Service: Cardiovascular;  Laterality: N/A;  . LOOP RECORDER REMOVAL N/A 09/28/2017   Procedure: LOOP RECORDER REMOVAL;  Surgeon: Sanda Klein, MD;  Location: [place] [facility];  Service: Cardiovascular;  Laterality: N/A;  . NO PAST SURGERIES    . TEE WITHOUT CARDIOVERSION N/A 05/07/2015   Procedure: TRANSESOPHAGEAL ECHOCARDIOGRAM (TEE);  Surgeon: Skeet Latch, MD;  [facility] ENDOSCOPY;  Service: Cardiovascular;  Laterality: N/A;   Family History  Problem Relation Age of Onset  . Heart disease Mother   . Colon cancer Brother   . Heart disease Brother    Social History   Socioeconomic History  . Marital status: Married    Spouse name: Not on file  . Number of children: Not on file  . Years of education: Not on file  . Highest education level: Not on file  Occupational History  . Not on file  Social Needs  . Financial resource strain: Not on file  . Food insecurity:    Worry: Not on file    Inability: Not on file  . Transportation needs:    Medical: Not on file    Non-medical: Not on file  Tobacco Use  . Smoking status: Former Smoker    Packs/day: 1.00    Years: 20.00    Pack years: 20.00    Types: Cigarettes    Last attempt to quit: 05/21/1974    Years since quitting: 44.1  . Smokeless tobacco: Never Used  . Tobacco comment: quit smoking in the 1970's/ educated [employer]   Substance and Sexual Activity  . Alcohol use: No    Alcohol/week: 0.0 standard drinks  . Drug use: No  . Sexual activity: Not Currently  Lifestyle  . Physical activity:    Days per week: Not on file    Minutes per session: Not on  file  . Stress: Not on file  Relationships  . Social connections:    Talks on phone: Not on file    Gets together: Not on file    Attends religious service: Not on file    Active member of club or organization: Not on file    Attends meetings of clubs or organizations: Not on file    Relationship status: Not on file  Other Topics Concern  . Not on file  Social History Narrative   Employed as a Therapist, music.    Married for 30 years   Has two daughters who live locally.     Outpatient Encounter Medications as of 07/11/2018  Medication Sig  . aspirin EC 325 MG tablet Take 1 tablet (325 mg total) by mouth daily.  Marland Kitchen atorvastatin (LIPITOR) 20 MG tablet TAKE 1 TABLET (20 MG TOTAL) BY MOUTH DAILY AT 6 PM.  . glipiZIDE (GLUCOTROL) 10 MG tablet Take 1 tablet (10 mg total) by mouth 2 (two) times daily before a meal. Take before evening meal  . lisinopril (PRINIVIL,ZESTRIL) 20 MG tablet TAKE 1 TABLET EVERY DAY  . metFORMIN (GLUCOPHAGE) 500 MG tablet TAKE 1/2 TAB IN THE MORNING AND 1 TABLET IN THE EVENING.  . naproxen sodium (ALEVE) 220 MG tablet Take 220 mg by mouth 2 (two) times daily as needed (for knee pain.).  Marland Kitchen ONETOUCH VERIO test strip USE AS INSTRUCTED TO CHECK BLOOD SUGAR ONCE A DAY  . terbinafine (LAMISIL) 250 MG tablet Take 1 tablet (250 mg total) by mouth daily.  Marland Kitchen FLUAD 0.5 ML SUSY TO BE ADMINISTERED BY PHARMACIST FOR IMMUNIZATION   No facility-administered encounter medications on file as of 07/11/2018.     Activities of Daily Living In your present state of health, do you have any difficulty performing the following activities: 07/11/2018  Hearing? N  Vision? N  Difficulty concentrating or making decisions? N  Walking or climbing stairs? N  Dressing or bathing? N  Doing errands, shopping? N  Preparing Food and eating ? N  Using the Toilet? N  In the past six months, have you accidently leaked urine? N  Do you have problems with loss of bowel control? N    Comment constipation on his birthday - will be taking a probiotic   Managing your Medications? N  Managing your Finances? N  Housekeeping or managing your Housekeeping? N  Some recent data might be hidden    Patient Care Team: Dorothyann Peng, NP as PCP - General (Family Medicine)  Croitoru, Dani Gobble, MD as Consulting Physician (Cardiology)   Assessment:   This is a routine wellness examination for [place].  Exercise Activities and Dietary recommendations    Goals    . Weight (lb) < 205 lb (93 kg)     Start going to the gym- x2 per week Cut back on fried foods to 2 times a week        Fall Risk Fall Risk  07/11/2018 07/04/2018 07/08/2017 06/28/2017 06/19/2015  Falls in the past year? No No No No No     Depression Screen PHQ 2/9 Scores 07/11/2018 07/04/2018 07/08/2017 06/28/2017  PHQ - 2 Score 0 0 0 0    Cognitive Function MMSE - Mini Mental State Exam 07/08/2017  Not completed: (No Data)     Ad8 score reviewed for issues:  Issues making decisions:  Less interest in hobbies / activities:  Repeats questions, stories (family complaining):  Trouble using ordinary gadgets (microwave, computer, phone):  Forgets the month or year:   Mismanaging finances:   Remembering appts:  Daily problems with thinking and/or memory: Ad8 score is=0        Immunization History  Administered Date(s) Administered  . Influenza, High Dose Seasonal PF 06/19/2015, 06/23/2016, 05/19/2017, 05/03/2018  . Influenza-Unspecified 05/21/2017  . Pneumococcal Conjugate-13 06/23/2016  . Pneumococcal Polysaccharide-23 05/06/2015     Screening Tests Health Maintenance  Topic Date Due  . TETANUS/TDAP  02/07/1966  . COLONOSCOPY  04/26/2018  . OPHTHALMOLOGY EXAM  12/22/2018  . HEMOGLOBIN A1C  01/03/2019  . FOOT EXAM  07/05/2019  . INFLUENZA VACCINE  Completed  . Hepatitis C Screening  Completed  . PNA vac Low Risk Adult  Completed        Plan:      PCP Notes   Health  Maintenance Tommi Rumps N ordered colonoscopy per his report  Will take tetanus if needed   Discussed shingrix with Tommi Rumps N last week   Abnormal Screens  As noted  Referrals  none  Patient concerns; none  Nurse Concerns; As noted   Next PCP apt Will see Tommi Rumps in 6 months        I have personally reviewed and noted the following in the patient's chart:   . Medical and social history . Use of alcohol, tobacco or illicit drugs  . Current medications and supplements . Functional ability and status . Nutritional status . Physical activity . Advanced directives . List of other physicians . Hospitalizations, surgeries, and ER visits in previous 12 months . Vitals . Screenings to include cognitive, depression, and falls . Referrals and appointments  In addition, I have reviewed and discussed with patient certain preventive protocols, quality metrics, and best practice recommendations. A written personalized care plan for preventive services as well as general preventive health recommendations were provided to patient.     Wynetta Fines, RN  07/11/2018

## 2018-07-11 ENCOUNTER — Ambulatory Visit (INDEPENDENT_AMBULATORY_CARE_PROVIDER_SITE_OTHER): Payer: Medicare Other

## 2018-07-11 VITALS — BP 134/80 | HR 85 | Ht 71.0 in | Wt 242.0 lb

## 2018-07-11 DIAGNOSIS — Z0000 Encounter for general adult medical examination without abnormal findings: Secondary | ICD-10-CM

## 2018-07-11 NOTE — Patient Instructions (Addendum)
Trevor Wheeler , Thank you for taking time to come for your Medicare Wellness Visit. I appreciate your ongoing commitment to your health goals. Please review the following plan we discussed and let me know if I can assist you in the future.   Men Ages 68 to 41 Years who Have Ever Smoked  The USPSTF recommends one-time screening for abdominal aortic aneurysm (AAA) with ultrasonography in men ages 78 to 87 years who have ever smoked.  Apt with heart doctor soon. Heart monitor was just removed   A Tetanus is recommended every 10 years. Medicare covers a tetanus if you have a cut or wound; otherwise, there may be a charge. If you had not had a tetanus with pertusses, known as the Tdap, you can take this anytime.   Shingrix is a vaccine for the prevention of Shingles in Adults 50 and older.  If you are on Medicare, the shingrix is covered under your Part D plan, so you will take both of the vaccines in the series at your pharmacy. Please check with your benefits regarding applicable copays or out of pocket expenses.  The Shingrix is given in 2 vaccines approx 8 weeks apart. You must receive the 2nd dose prior to 6 months from receipt of the first. Please have the pharmacist print out you Immunization  dates for our office records  Discussed with Trevor Wheeler about this   Will try to complete AD; Given copy  Referred to [facility] for questions [place] offers free advance directive forms, as well as assistance in completing the forms themselves. For assistance, contact the Spiritual Care Department at [phone], or the Clinical Social Work Department at [phone].     These are the goals we discussed: Goals    . Weight (lb) < 205 lb (93 kg)     Start going to the gym- x2 per week Cut back on fried foods to 2 times a week        This is a list of the screening recommended for you and due dates:  Health Maintenance  Topic Date Due  . Tetanus Vaccine  02/07/1966  . Colon Cancer Screening   04/26/2018  . Eye exam for diabetics  12/22/2018  . Hemoglobin A1C  01/03/2019  . Complete foot exam   07/05/2019  . Flu Shot  Completed  .  Hepatitis C: One time screening is recommended by Center for Disease Control  (CDC) for  adults born from [birth year] through 1965.   Completed  . Pneumonia vaccines  Completed      Fall Prevention in the Home Falls can cause injuries. They can happen to people of all ages. There are many things you can do to make your home safe and to help prevent falls. What can I do on the outside of my home?  Regularly fix the edges of walkways and driveways and fix any cracks.  Remove anything that might make you trip as you walk through a door, such as a raised step or threshold.  Trim any bushes or trees on the path to your home.  Use bright outdoor lighting.  Clear any walking paths of anything that might make someone trip, such as rocks or tools.  Regularly check to see if handrails are loose or broken. Make sure that both sides of any steps have handrails.  Any raised decks and porches should have guardrails on the edges.  Have any leaves, snow, or ice cleared regularly.  Use sand or salt on  walking paths during winter.  Clean up any spills in your garage right away. This includes oil or grease spills. What can I do in the bathroom?  Use night lights.  Install grab bars by the toilet and in the tub and shower. Do not use towel bars as grab bars.  Use non-skid mats or decals in the tub or shower.  If you need to sit down in the shower, use a plastic, non-slip stool.  Keep the floor dry. Clean up any water that spills on the floor as soon as it happens.  Remove soap buildup in the tub or shower regularly.  Attach bath mats securely with double-sided non-slip rug tape.  Do not have throw rugs and other things on the floor that can make you trip. What can I do in the bedroom?  Use night lights.  Make sure that you have a light by your bed  that is easy to reach.  Do not use any sheets or blankets that are too big for your bed. They should not hang down onto the floor.  Have a firm chair that has side arms. You can use this for support while you get dressed.  Do not have throw rugs and other things on the floor that can make you trip. What can I do in the kitchen?  Clean up any spills right away.  Avoid walking on wet floors.  Keep items that you use a lot in easy-to-reach places.  If you need to reach something above you, use a strong step stool that has a grab bar.  Keep electrical cords out of the way.  Do not use floor polish or wax that makes floors slippery. If you must use wax, use non-skid floor wax.  Do not have throw rugs and other things on the floor that can make you trip. What can I do with my stairs?  Do not leave any items on the stairs.  Make sure that there are handrails on both sides of the stairs and use them. Fix handrails that are broken or loose. Make sure that handrails are as long as the stairways.  Check any carpeting to make sure that it is firmly attached to the stairs. Fix any carpet that is loose or worn.  Avoid having throw rugs at the top or bottom of the stairs. If you do have throw rugs, attach them to the floor with carpet tape.  Make sure that you have a light switch at the top of the stairs and the bottom of the stairs. If you do not have them, ask someone to add them for you. What else can I do to help prevent falls?  Wear shoes that: ? Do not have high heels. ? Have rubber bottoms. ? Are comfortable and fit you well. ? Are closed at the toe. Do not wear sandals.  If you use a stepladder: ? Make sure that it is fully opened. Do not climb a closed stepladder. ? Make sure that both sides of the stepladder are locked into place. ? Ask someone to hold it for you, if possible.  Clearly mark and make sure that you can see: ? Any grab bars or handrails. ? First and last  steps. ? Where the edge of each step is.  Use tools that help you move around (mobility aids) if they are needed. These include: ? Canes. ? Walkers. ? Scooters. ? Crutches.  Turn on the lights when you go into a dark area. Replace  any light bulbs as soon as they burn out.  Set up your furniture so you have a clear path. Avoid moving your furniture around.  If any of your floors are uneven, fix them.  If there are any pets around you, be aware of where they are.  Review your medicines with your doctor. Some medicines can make you feel dizzy. This can increase your chance of falling. Ask your doctor what other things that you can do to help prevent falls. This information is not intended to replace advice given to you by your health care provider. Make sure you discuss any questions you have with your health care provider. Document Released: 07/03/2009 Document Revised: 02/12/2016 Document Reviewed: 10/11/2014 Elsevier Interactive Patient Education  2018 [place] Maintenance, Male A healthy lifestyle and preventive care is important for your health and wellness. Ask your health care provider about what schedule of regular examinations is right for you. What should I know about weight and diet? Eat a Healthy Diet  Eat plenty of vegetables, fruits, whole grains, low-fat dairy products, and lean protein.  Do not eat a lot of foods high in solid fats, added sugars, or salt.  Maintain a Healthy Weight Regular exercise can help you achieve or maintain a healthy weight. You should:  Do at least 150 minutes of exercise each week. The exercise should increase your heart rate and make you sweat (moderate-intensity exercise).  Do strength-training exercises at least twice a week.  Watch Your Levels of Cholesterol and Blood Lipids  Have your blood tested for lipids and cholesterol every 5 years starting at 71 years of age. If you are at high risk for heart disease, you  should start having your blood tested when you are 71 years old. You may need to have your cholesterol levels checked more often if: ? Your lipid or cholesterol levels are high. ? You are older than 71 years of age. ? You are at high risk for heart disease.  What should I know about cancer screening? Many types of cancers can be detected early and may often be prevented. Lung Cancer  You should be screened every year for lung cancer if: ? You are a current smoker who has smoked for at least 30 years. ? You are a former smoker who has quit within the past 15 years.  Talk to your health care provider about your screening options, when you should start screening, and how often you should be screened.  Colorectal Cancer  Routine colorectal cancer screening usually begins at 71 years of age and should be repeated every 5-10 years until you are 71 years old. You may need to be screened more often if early forms of precancerous polyps or small growths are found. Your health care provider may recommend screening at an earlier age if you have risk factors for colon cancer.  Your health care provider may recommend using home test kits to check for hidden blood in the stool.  A small camera at the end of a tube can be used to examine your colon (sigmoidoscopy or colonoscopy). This checks for the earliest forms of colorectal cancer.  Prostate and Testicular Cancer  Depending on your age and overall health, your health care provider may do certain tests to screen for prostate and testicular cancer.  Talk to your health care provider about any symptoms or concerns you have about testicular or prostate cancer.  Skin Cancer  Check your skin from head to toe  regularly.  Tell your health care provider about any new moles or changes in moles, especially if: ? There is a change in a mole's size, shape, or color. ? You have a mole that is larger than a pencil eraser.  Always use sunscreen. Apply  sunscreen liberally and repeat throughout the day.  Protect yourself by wearing long sleeves, pants, a wide-brimmed hat, and sunglasses when outside.  What should I know about heart disease, diabetes, and high blood pressure?  If you are 84-18 years of age, have your blood pressure checked every 3-5 years. If you are 58 years of age or older, have your blood pressure checked every year. You should have your blood pressure measured twice-once when you are at a hospital or clinic, and once when you are not at a hospital or clinic. Record the average of the two measurements. To check your blood pressure when you are not at a hospital or clinic, you can use: ? An automated blood pressure machine at a pharmacy. ? A home blood pressure monitor.  Talk to your health care provider about your target blood pressure.  If you are between 42-68 years old, ask your health care provider if you should take aspirin to prevent heart disease.  Have regular diabetes screenings by checking your fasting blood sugar level. ? If you are at a normal weight and have a low risk for diabetes, have this test once every three years after the age of 71. ? If you are overweight and have a high risk for diabetes, consider being tested at a younger age or more often.  A one-time screening for abdominal aortic aneurysm (AAA) by ultrasound is recommended for men aged 66-75 years who are current or former smokers. What should I know about preventing infection? Hepatitis B If you have a higher risk for hepatitis B, you should be screened for this virus. Talk with your health care provider to find out if you are at risk for hepatitis B infection. Hepatitis C Blood testing is recommended for:  Everyone born from [birth year] through 1965.  Anyone with known risk factors for hepatitis C.  Sexually Transmitted Diseases (STDs)  You should be screened each year for STDs including gonorrhea and chlamydia if: ? You are sexually active  and are younger than 71 years of age. ? You are older than 71 years of age and your health care provider tells you that you are at risk for this type of infection. ? Your sexual activity has changed since you were last screened and you are at an increased risk for chlamydia or gonorrhea. Ask your health care provider if you are at risk.  Talk with your health care provider about whether you are at high risk of being infected with HIV. Your health care provider may recommend a prescription medicine to help prevent HIV infection.  What else can I do?  Schedule regular health, dental, and eye exams.  Stay current with your vaccines (immunizations).  Do not use any tobacco products, such as cigarettes, chewing tobacco, and e-cigarettes. If you need help quitting, ask your health care provider.  Limit alcohol intake to no more than 2 drinks per day. One drink equals 12 ounces of beer, 5 ounces of wine, or 1 ounces of hard liquor.  Do not use street drugs.  Do not share needles.  Ask your health care provider for help if you need support or information about quitting drugs.  Tell your health care provider if you often  feel depressed.  Tell your health care provider if you have ever been abused or do not feel safe at home. This information is not intended to replace advice given to you by your health care provider. Make sure you discuss any questions you have with your health care provider. Document Released: 03/04/2008 Document Revised: 05/05/2016 Document Reviewed: 06/10/2015 Elsevier Interactive Patient Education  [employer].

## 2018-07-11 NOTE — Progress Notes (Signed)
I have reviewed documentation for AWV and Advance Care Planning provided by the health coach and agree with documentation. I was immediately available for questions.  

## 2018-07-12 ENCOUNTER — Encounter: Payer: Self-pay | Admitting: Family Medicine

## 2018-08-14 ENCOUNTER — Other Ambulatory Visit: Payer: Self-pay | Admitting: Adult Health

## 2018-08-14 DIAGNOSIS — E1165 Type 2 diabetes mellitus with hyperglycemia: Principal | ICD-10-CM

## 2018-08-14 DIAGNOSIS — IMO0001 Reserved for inherently not codable concepts without codable children: Secondary | ICD-10-CM

## 2018-08-14 NOTE — Telephone Encounter (Signed)
Sent to the pharmacy by e-scribe.  Pt has upcoming appt on 01/02/18.  No further action required.

## 2018-08-24 ENCOUNTER — Encounter: Payer: Self-pay | Admitting: Adult Health

## 2018-09-24 ENCOUNTER — Other Ambulatory Visit: Payer: Self-pay | Admitting: Adult Health

## 2018-09-24 DIAGNOSIS — E1165 Type 2 diabetes mellitus with hyperglycemia: Principal | ICD-10-CM

## 2018-09-24 DIAGNOSIS — IMO0001 Reserved for inherently not codable concepts without codable children: Secondary | ICD-10-CM

## 2018-09-26 NOTE — Telephone Encounter (Signed)
Denied.  Filled for 6 months on 07/04/18.

## 2018-09-28 ENCOUNTER — Ambulatory Visit: Payer: Medicare Other | Admitting: Cardiovascular Disease

## 2018-09-28 ENCOUNTER — Encounter: Payer: Self-pay | Admitting: Cardiovascular Disease

## 2018-09-28 VITALS — BP 134/80 | HR 73 | Ht 71.0 in | Wt 240.0 lb

## 2018-09-28 DIAGNOSIS — E7800 Pure hypercholesterolemia, unspecified: Secondary | ICD-10-CM | POA: Diagnosis not present

## 2018-09-28 DIAGNOSIS — I10 Essential (primary) hypertension: Secondary | ICD-10-CM | POA: Diagnosis not present

## 2018-09-28 DIAGNOSIS — E1169 Type 2 diabetes mellitus with other specified complication: Secondary | ICD-10-CM | POA: Diagnosis not present

## 2018-09-28 DIAGNOSIS — Z8673 Personal history of transient ischemic attack (TIA), and cerebral infarction without residual deficits: Secondary | ICD-10-CM | POA: Diagnosis not present

## 2018-09-28 DIAGNOSIS — E669 Obesity, unspecified: Secondary | ICD-10-CM

## 2018-09-28 NOTE — Patient Instructions (Signed)
Dr Croitoru recommends that you follow-up with him as needed. 

## 2018-09-28 NOTE — Progress Notes (Signed)
Cardiology Office Note    Date:  09/28/2018   ID:  Trevor Wheeler, DOB 03-16-1946, MRN [id number]  PCP:  Dorothyann Peng, NP  Cardiologist:   Sanda Klein, MD   Chief Complaint  Patient presents with  . Follow-up    History of stroke    History of Present Illness:  Trevor Wheeler is a 73 y.o. male with cryptogenic stroke, hypertension, DM and hyperlipidemia returning for follow-up.   An implantable loop recorder was placed when he had the left cerebellar stroke in August 2016 but did not show atrial fibrillation during a 3-year monitoring.  It was explanted a year ago.  He has not had any neurological events since.  His blood pressure is well controlled, he takes a statin for hyperlipidemia and his last hemoglobin A1c remains in the <7% range.  Performs intense physical activity regularly.  He had a single nocturnal pause of almost 3 seconds that occurred in a pattern suggestive of vagal bradycardia, 03/21/2017. He thinks he was probably asleep at the time.  He has not had symptomatic bradycardia or syncope.  The patient specifically denies any chest pain at rest exertion, dyspnea at rest or with exertion, orthopnea, paroxysmal nocturnal dyspnea, syncope, palpitations, focal neurological deficits, intermittent claudication, lower extremity edema, unexplained weight gain, cough, hemoptysis or wheezing.    Past Medical History:  Diagnosis Date  . CVA (cerebral vascular accident) ([place]) 05/04/2015   L cerebellar, "only problem I have is w/balance" 05/05/2015)  . Hypertension   . Type II diabetes mellitus ([place]) dx'd 05/05/2015    Past Surgical History:  Procedure Laterality Date  . Cataract Right 2018  . EP IMPLANTABLE DEVICE N/A 05/07/2015   Procedure: Loop Recorder Insertion;  Surgeon: Sanda Klein, MD;  Location: [place] [facility];  Service: Cardiovascular;  Laterality: N/A;  . LOOP RECORDER REMOVAL N/A 09/28/2017   Procedure: LOOP RECORDER REMOVAL;  Surgeon: Sanda Klein, MD;   Location: [place] [facility];  Service: Cardiovascular;  Laterality: N/A;  . NO PAST SURGERIES    . TEE WITHOUT CARDIOVERSION N/A 05/07/2015   Procedure: TRANSESOPHAGEAL ECHOCARDIOGRAM (TEE);  Surgeon: Skeet Latch, MD;  Location: [facility] ENDOSCOPY;  Service: Cardiovascular;  Laterality: N/A;    Current Medications: Outpatient Medications Prior to Visit  Medication Sig Dispense Refill  . aspirin EC 325 MG tablet Take 1 tablet (325 mg total) by mouth daily. 30 tablet 0  . atorvastatin (LIPITOR) 20 MG tablet TAKE 1 TABLET (20 MG TOTAL) BY MOUTH DAILY AT 6 PM. 90 tablet 1  . FLUAD 0.5 ML SUSY TO BE ADMINISTERED BY PHARMACIST FOR IMMUNIZATION  0  . glipiZIDE (GLUCOTROL) 10 MG tablet Take 1 tablet (10 mg total) by mouth 2 (two) times daily before a meal. Take before evening meal 90 tablet 1  . lisinopril (PRINIVIL,ZESTRIL) 20 MG tablet TAKE 1 TABLET EVERY DAY 90 tablet 1  . metFORMIN (GLUCOPHAGE) 500 MG tablet TAKE 1/2 TAB IN THE MORNING AND 1 TABLET IN THE EVENING. 135 tablet 1  . naproxen sodium (ALEVE) 220 MG tablet Take 220 mg by mouth 2 (two) times daily as needed (for knee pain.).    Marland Kitchen ONETOUCH VERIO test strip USE AS INSTRUCTED TO CHECK BLOOD SUGAR ONCE A DAY 100 each 3  . terbinafine (LAMISIL) 250 MG tablet Take 1 tablet (250 mg total) by mouth daily. 90 tablet 0   No facility-administered medications prior to visit.      Allergies:   Penicillins   Social History  Socioeconomic History  . Marital status: Married    Spouse name: Not on file  . Number of children: Not on file  . Years of education: Not on file  . Highest education level: Not on file  Occupational History  . Not on file  Social Needs  . Financial resource strain: Not on file  . Food insecurity:    Worry: Not on file    Inability: Not on file  . Transportation needs:    Medical: Not on file    Non-medical: Not on file  Tobacco Use  . Smoking status: Former Smoker    Packs/day: 1.00    Years: 20.00    Pack  years: 20.00    Types: Cigarettes    Last attempt to quit: 05/21/1974    Years since quitting: 44.3  . Smokeless tobacco: Never Used  . Tobacco comment: quit smoking in the 1970's/ educated AAA   Substance and Sexual Activity  . Alcohol use: No    Alcohol/week: 0.0 standard drinks  . Drug use: No  . Sexual activity: Not Currently  Lifestyle  . Physical activity:    Days per week: Not on file    Minutes per session: Not on file  . Stress: Not on file  Relationships  . Social connections:    Talks on phone: Not on file    Gets together: Not on file    Attends religious service: Not on file    Active member of club or organization: Not on file    Attends meetings of clubs or organizations: Not on file    Relationship status: Not on file  Other Topics Concern  . Not on file  Social History Narrative   Employed as a Therapist, music.    Married for 30 years   Has two daughters who live locally.      Family History:  The patient's family history includes Colon cancer in his brother; Heart disease in his brother and mother.   ROS:   Please see the history of present illness.    ROS all other systems reviewed and are negative   PHYSICAL EXAM:   VS:  BP 134/80   Pulse 73   Ht 5\' 11"  (1.803 m)   Wt 240 lb (108.9 kg)   BMI 33.47 kg/m     General: Alert, oriented x3, no distress, he is moderately obese but also looks quite strong and fit and younger than his stated age Head: no evidence of trauma, PERRL, EOMI, no exophtalmos or lid lag, no myxedema, no xanthelasma; normal ears, nose and oropharynx Neck: normal jugular venous pulsations and no hepatojugular reflux; brisk carotid pulses without delay and no carotid bruits Chest: clear to auscultation, no signs of consolidation by percussion or palpation, normal fremitus, symmetrical and full respiratory excursions Cardiovascular: normal position and quality of the apical impulse, regular rhythm, normal first and second heart  sounds, no murmurs, rubs or gallops Abdomen: no tenderness or distention, no masses by palpation, no abnormal pulsatility or arterial bruits, normal bowel sounds, no hepatosplenomegaly Extremities: no clubbing, cyanosis or edema; 2+ radial, ulnar and brachial pulses bilaterally; 2+ right femoral, posterior tibial and dorsalis pedis pulses; 2+ left femoral, posterior tibial and dorsalis pedis pulses; no subclavian or femoral bruits Neurological: grossly nonfocal Psych: Normal mood and affect   Wt Readings from Last 3 Encounters:  09/28/18 240 lb (108.9 kg)  07/11/18 242 lb (109.8 kg)  07/04/18 238 lb 11.2 oz (108.3 kg)  Studies/Labs Reviewed:   EKG:  EKG is ordered today.  The ekg ordered today demonstrates sinus rhythm, normal tracing  Recent Labs: 07/04/2018: ALT 30; BUN 14; Creatinine, Ser 0.91; Hemoglobin 13.9; Platelets 209.0; Potassium 4.6; Sodium 141; TSH 0.78   Lipid Panel    Component Value Date/Time   CHOL 127 07/04/2018 0725   TRIG 77.0 07/04/2018 0725   HDL 34.20 (L) 07/04/2018 0725   CHOLHDL 4 07/04/2018 0725   VLDL 15.4 07/04/2018 0725   LDLCALC 78 07/04/2018 0725   LDLDIRECT 182.2 08/05/2010 0838    ASSESSMENT:    1. History of stroke   2. Essential hypertension   3. Hypercholesterolemia   4. Diabetes mellitus type 2 in obese [facility])      PLAN:  In order of problems listed above:  1. History of cryptogenic stroke: on ASA.  No recurrence since 2016 2. HTN: Adequate control. 3. HLP: Excellent LDL level on statin, persistently low HDL will not improve without weight loss, monitored by PCP 4. DM: last A1c 6.9%.  5. Obesity: Even though his metabolic parameters are generally well controlled he should still try to lose some of the extra weight he is carrying.    Medication Adjustments/Labs and Tests Ordered: Current medicines are reviewed at length with the patient today.  Concerns regarding medicines are outlined above.  Medication changes, Labs and  Tests ordered today are listed in the Patient Instructions below. Patient Instructions  Dr Sallyanne Kuster recommends that you follow-up with him as needed.    Signed, Sanda Klein, MD  09/28/2018 8:58 AM    [facility], [place], [place]  [address] Phone: [phone]; Fax: [phone]

## 2018-09-29 ENCOUNTER — Other Ambulatory Visit: Payer: Self-pay | Admitting: Adult Health

## 2018-10-04 ENCOUNTER — Encounter: Payer: Self-pay | Admitting: Adult Health

## 2018-10-04 ENCOUNTER — Other Ambulatory Visit: Payer: Self-pay | Admitting: Adult Health

## 2018-10-04 DIAGNOSIS — E1165 Type 2 diabetes mellitus with hyperglycemia: Principal | ICD-10-CM

## 2018-10-04 DIAGNOSIS — IMO0001 Reserved for inherently not codable concepts without codable children: Secondary | ICD-10-CM

## 2018-10-04 NOTE — Telephone Encounter (Signed)
Sent to the pharmacy by e-scribe. 

## 2018-10-23 ENCOUNTER — Encounter: Payer: Self-pay | Admitting: Gastroenterology

## 2018-10-23 ENCOUNTER — Other Ambulatory Visit: Payer: Self-pay | Admitting: Cardiovascular Disease

## 2018-10-23 DIAGNOSIS — I639 Cerebral infarction, unspecified: Secondary | ICD-10-CM

## 2018-10-23 DIAGNOSIS — E785 Hyperlipidemia, unspecified: Secondary | ICD-10-CM

## 2018-10-23 NOTE — Telephone Encounter (Signed)
Rx(s) sent to pharmacy electronically.  

## 2018-10-30 LAB — HM DIABETES EYE EXAM

## 2018-11-02 ENCOUNTER — Encounter: Payer: Self-pay | Admitting: Family Medicine

## 2018-11-21 ENCOUNTER — Ambulatory Visit (AMBULATORY_SURGERY_CENTER): Payer: Self-pay

## 2018-11-21 ENCOUNTER — Encounter: Payer: Self-pay | Admitting: Gastroenterology

## 2018-11-21 VITALS — Ht 71.0 in | Wt 242.0 lb

## 2018-11-21 DIAGNOSIS — Z860100 Personal history of colon polyps, unspecified: Secondary | ICD-10-CM

## 2018-11-21 MED ORDER — NA SULFATE-K SULFATE-MG SULF 17.5-3.13-1.6 GM/177ML PO SOLN: 1.0000 | Freq: Once | ORAL | 0 refills | Status: AC

## 2018-11-21 NOTE — Progress Notes (Signed)
Denies allergies to eggs or soy products. Denies complication of anesthesia or sedation. Denies use of weight loss medication. Denies use of O2.   Emmi instructions declined.  

## 2018-11-29 ENCOUNTER — Other Ambulatory Visit: Payer: Self-pay | Admitting: Cardiovascular Disease

## 2018-12-04 ENCOUNTER — Telehealth: Payer: Self-pay | Admitting: *Deleted

## 2018-12-04 NOTE — Telephone Encounter (Signed)
Covid-19 travel screening questions  Have you traveled in the last 14 days? No If yes where?  Do you now or have you had a fever in the last 14 days? No  Do you have any respiratory symptoms of shortness of breath or cough now or in the last 14 days? No  Do you have any family members or close contacts with diagnosed or suspected Covid-19? No       

## 2018-12-05 ENCOUNTER — Encounter: Payer: Self-pay | Admitting: Gastroenterology

## 2018-12-05 ENCOUNTER — Other Ambulatory Visit: Payer: Self-pay

## 2018-12-05 ENCOUNTER — Ambulatory Visit (AMBULATORY_SURGERY_CENTER): Payer: Medicare Other | Admitting: Gastroenterology

## 2018-12-05 VITALS — BP 109/62 | HR 59 | Temp 96.8°F | Resp 14 | Ht 71.0 in | Wt 242.0 lb

## 2018-12-05 DIAGNOSIS — K621 Rectal polyp: Secondary | ICD-10-CM

## 2018-12-05 DIAGNOSIS — Z86010 Personal history of colonic polyps: Secondary | ICD-10-CM

## 2018-12-05 DIAGNOSIS — D127 Benign neoplasm of rectosigmoid junction: Secondary | ICD-10-CM

## 2018-12-05 DIAGNOSIS — D128 Benign neoplasm of rectum: Secondary | ICD-10-CM

## 2018-12-05 DIAGNOSIS — D129 Benign neoplasm of anus and anal canal: Secondary | ICD-10-CM

## 2018-12-05 DIAGNOSIS — K635 Polyp of colon: Secondary | ICD-10-CM

## 2018-12-05 DIAGNOSIS — D124 Benign neoplasm of descending colon: Secondary | ICD-10-CM

## 2018-12-05 DIAGNOSIS — D125 Benign neoplasm of sigmoid colon: Secondary | ICD-10-CM

## 2018-12-05 MED ORDER — SODIUM CHLORIDE 0.9 % IV SOLN: 500.0000 mL | Freq: Once | INTRAVENOUS | Status: DC

## 2018-12-05 NOTE — Progress Notes (Signed)
Called to room to assist during endoscopic procedure.  Patient ID and intended procedure confirmed with present staff. Received instructions for my participation in the procedure from the performing physician.  

## 2018-12-05 NOTE — Progress Notes (Signed)
Pt's states no medical or surgical changes since previsit or office visit. 

## 2018-12-05 NOTE — Op Note (Signed)
[place] Patient Name: Trevor Wheeler Procedure Date: 12/05/2018 7:56 AM MRN: [id number] Endoscopist: Ladene Artist , MD Age: 73 Referring MD:  Date of Birth: [birth year] Gender: Male Account #: [id number] Procedure:                Colonoscopy Indications:              Surveillance: Personal history of adenomatous                            polyps on last colonoscopy > 5 years ago Medicines:                Monitored Anesthesia Care Procedure:                Pre-Anesthesia Assessment:                           - Prior to the procedure, a History and Physical                            was performed, and patient medications and                            allergies were reviewed. The patient's tolerance of                            previous anesthesia was also reviewed. The risks                            and benefits of the procedure and the sedation                            options and risks were discussed with the patient.                            All questions were answered, and informed consent                            was obtained. Prior Anticoagulants: The patient has                            taken no previous anticoagulant or antiplatelet                            agents. ASA Grade Assessment: II - A patient with                            mild systemic disease. After reviewing the risks                            and benefits, the patient was deemed in                            satisfactory condition to undergo the procedure.  After obtaining informed consent, the colonoscope                            was passed under direct vision. Throughout the                            procedure, the patient's blood pressure, pulse, and                            oxygen saturations were monitored continuously. The                            Colonoscope was introduced through the anus and                            advanced to the the cecum,  identified by                            appendiceal orifice and ileocecal valve. The                            ileocecal valve, appendiceal orifice, and rectum                            were photographed. The quality of the bowel                            preparation was good. The colonoscopy was performed                            without difficulty. The patient tolerated the                            procedure well. Scope In: 8:13:34 AM Scope Out: 8:33:04 AM Scope Withdrawal Time: 0 hours 17 minutes 23 seconds  Total Procedure Duration: 0 hours 19 minutes 30 seconds  Findings:                 The perianal and digital rectal examinations were                            normal.                           Four sessile polyps were found in the rectum (1),                            sigmoid colon (2) and descending colon (1). The                            polyps were 6 to 7 mm in size. These polyps were                            removed with a cold snare. Resection and retrieval  were complete.                           A single medium-sized localized angiodysplastic                            lesion without bleeding was found in the cecum.                           Internal hemorrhoids were found during                            retroflexion. The hemorrhoids were small and Grade                            I (internal hemorrhoids that do not prolapse).                           The exam was otherwise without abnormality on                            direct and retroflexion views. Complications:            No immediate complications. Estimated blood loss:                            None. Estimated Blood Loss:     Estimated blood loss: none. Impression:               - Four 6 to 7 mm polyps in the rectum, in the                            sigmoid colon and in the descending colon, removed                            with a cold snare. Resected and  retrieved.                           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                           - Internal hemorrhoids.                           - The examination was otherwise normal on direct                            and retroflexion views. Recommendation:           - Repeat colonoscopy date to be determined after                            pending pathology results are reviewed for                            surveillance.                           -  Patient has a contact number available for                            emergencies. The signs and symptoms of potential                            delayed complications were discussed with the                            patient. Return to normal activities tomorrow.                            Written discharge instructions were provided to the                            patient.                           - Resume previous diet.                           - Continue present medications.                           - Await pathology results. Ladene Artist, MD 12/05/2018 8:39:10 AM This report has been signed electronically.

## 2018-12-05 NOTE — Progress Notes (Signed)
PT taken to PACU. Monitors in place. VSS. Report given to RN. 

## 2018-12-05 NOTE — Patient Instructions (Signed)
YOU HAD AN ENDOSCOPIC PROCEDURE TODAY AT THE [facility]:   Refer to the procedure report that was given to you for any specific questions about what was found during the examination.  If the procedure report does not answer your questions, please call your gastroenterologist to clarify.  If you requested that your care partner not be given the details of your procedure findings, then the procedure report has been included in a sealed envelope for you to review at your convenience later.  YOU SHOULD EXPECT: Some feelings of bloating in the abdomen. Passage of more gas than usual.  Walking can help get rid of the air that was put into your GI tract during the procedure and reduce the bloating. If you had a lower endoscopy (such as a colonoscopy or flexible sigmoidoscopy) you may notice spotting of blood in your stool or on the toilet paper. If you underwent a bowel prep for your procedure, you may not have a normal bowel movement for a few days.  Please Note:  You might notice some irritation and congestion in your nose or some drainage.  This is from the oxygen used during your procedure.  There is no need for concern and it should clear up in a day or so.  SYMPTOMS TO REPORT IMMEDIATELY:   Following lower endoscopy (colonoscopy or flexible sigmoidoscopy):  Excessive amounts of blood in the stool  Significant tenderness or worsening of abdominal pains  Swelling of the abdomen that is new, acute  Fever of 100F or higher   For urgent or emergent issues, a gastroenterologist can be reached at any hour by calling [phone].   DIET:  We do recommend a small meal at first, but then you may proceed to your regular diet.  Drink plenty of fluids but you should avoid alcoholic beverages for 24 hours.  ACTIVITY:  You should plan to take it easy for the rest of today and you should NOT DRIVE or use heavy machinery until tomorrow (because of the sedation medicines used during the test).     FOLLOW UP: Our staff will call the number listed on your records the next business day following your procedure to check on you and address any questions or concerns that you may have regarding the information given to you following your procedure. If we do not reach you, we will leave a message.  However, if you are feeling well and you are not experiencing any problems, there is no need to return our call.  We will assume that you have returned to your regular daily activities without incident.  If any biopsies were taken you will be contacted by phone or by letter within the next 1-3 weeks.  Please call us at [phone] if you have not heard about the biopsies in 3 weeks.    SIGNATURES/CONFIDENTIALITY: You and/or your care partner have signed paperwork which will be entered into your electronic medical record.  These signatures attest to the fact that that the information above on your After Visit Summary has been reviewed and is understood.  Full responsibility of the confidentiality of this discharge information lies with you and/or your care-partner.  Read all handouts given to you by your recovery room nurse. 

## 2018-12-06 ENCOUNTER — Telehealth: Payer: Self-pay

## 2018-12-06 NOTE — Telephone Encounter (Signed)
  Follow up Call-  Call back number 12/05/2018  Post procedure Call Back phone  # [phone]  Permission to leave phone message Yes  Some recent data might be hidden     Patient questions:  Do you have a fever, pain , or abdominal swelling? No. Pain Score  0 *  Have you tolerated food without any problems? Yes.    Have you been able to return to your normal activities? Yes.    Do you have any questions about your discharge instructions: Diet   No. Medications  No. Follow up visit  No.  Do you have questions or concerns about your Care? No.  Actions: * If pain score is 4 or above: No action needed, pain <4.

## 2018-12-12 ENCOUNTER — Encounter: Payer: Self-pay | Admitting: Gastroenterology

## 2019-01-01 ENCOUNTER — Other Ambulatory Visit: Payer: Self-pay

## 2019-01-01 ENCOUNTER — Other Ambulatory Visit: Payer: Self-pay | Admitting: Family Medicine

## 2019-01-01 ENCOUNTER — Other Ambulatory Visit (INDEPENDENT_AMBULATORY_CARE_PROVIDER_SITE_OTHER): Payer: Medicare Other

## 2019-01-01 DIAGNOSIS — E1165 Type 2 diabetes mellitus with hyperglycemia: Principal | ICD-10-CM

## 2019-01-01 DIAGNOSIS — IMO0001 Reserved for inherently not codable concepts without codable children: Secondary | ICD-10-CM

## 2019-01-01 LAB — HEMOGLOBIN A1C: Hgb A1c MFr Bld: 7.6 % — ABNORMAL HIGH (ref 4.6–6.5)

## 2019-01-03 ENCOUNTER — Ambulatory Visit (INDEPENDENT_AMBULATORY_CARE_PROVIDER_SITE_OTHER): Payer: Medicare Other | Admitting: Adult Health

## 2019-01-03 ENCOUNTER — Other Ambulatory Visit: Payer: Self-pay

## 2019-01-03 ENCOUNTER — Encounter: Payer: Self-pay | Admitting: Adult Health

## 2019-01-03 DIAGNOSIS — E1159 Type 2 diabetes mellitus with other circulatory complications: Secondary | ICD-10-CM | POA: Diagnosis not present

## 2019-01-03 MED ORDER — GLIPIZIDE 10 MG PO TABS: ORAL_TABLET | ORAL | 0 refills | Status: DC

## 2019-01-03 MED ORDER — METFORMIN HCL 500 MG PO TABS: ORAL_TABLET | ORAL | 0 refills | Status: DC

## 2019-01-03 NOTE — Progress Notes (Signed)
Virtual Visit via Video Note  I connected with Trevor Wheeler  on 01/03/19 at 10:00 AM EDT by a video enabled telemedicine application and verified that I am speaking with the correct person using two identifiers.  Location patient: home Location provider:work or home office Persons participating in the virtual visit: patient, provider  I discussed the limitations of evaluation and management by telemedicine and the availability of in person appointments. The patient expressed understanding and agreed to proceed.   HPI: 73-year-old male who is being evaluated today for follow-up regarding diabetes mellitus.  Is currently prescribed metformin 150 mg in the morning and 500 mg in the evening as well as glipizide 10 mg twice daily.  His A1c has been very well controlled on the past, on or around 6.5 for multiple years.  Today he reports that he continues to walk his dog on a daily basis and is mowing 12 yards.  His blood sugars have been 70s to 100s in the morning and throughout the day but increase to the 160s in the evening.  He reports that his wife has been baking more and he has been eating more cakes and sweets than what he has in the past.  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  We will be taking over his blood pressure and hyperlipidemia medications going forward   ROS: See pertinent positives and negatives per HPI.  Past Medical History:  Diagnosis Date  . Cataract   . CVA (cerebral vascular accident) ([place]) 05/04/2015   L cerebellar, "only problem I have is w/balance" 05/05/2015)  . Hypertension   . Type II diabetes mellitus ([place]) dx'd 05/05/2015    Past Surgical History:  Procedure Laterality Date  . Cataract Right 2018  . EP IMPLANTABLE DEVICE N/A 05/07/2015   Procedure: Loop Recorder Insertion;  Surgeon: Sanda Klein, MD;  Location: [facility];  Service: Cardiovascular;  Laterality:  N/A;  . LOOP RECORDER REMOVAL N/A 09/28/2017   Procedure: LOOP RECORDER REMOVAL;  Surgeon: Sanda Klein, MD;  Location: [facility];  Service: Cardiovascular;  Laterality: N/A;  . NO PAST SURGERIES    . TEE WITHOUT CARDIOVERSION N/A 05/07/2015   Procedure: TRANSESOPHAGEAL ECHOCARDIOGRAM (TEE);  Surgeon: Skeet Latch, MD;  Location: [facility];  Service: Cardiovascular;  Laterality: N/A;    Family History  Problem Relation Age of Onset  . Heart disease Mother   . Colon cancer Brother   . Heart disease Brother   . Esophageal cancer Neg Hx   . Rectal cancer Neg Hx   . Stomach cancer Neg Hx        Current Outpatient Medications:  .  aspirin EC 325 MG tablet, Take 1 tablet (325 mg total) by mouth daily., Disp: 30 tablet, Rfl: 0 .  atorvastatin (LIPITOR) 20 MG tablet, Take 1 tablet (20 mg total) by mouth daily at 6 PM. FUTURE REFILLS MUST COME FROM PCP, Disp: 90 tablet, Rfl: 3 .  FLUAD 0.5 ML SUSY, TO BE ADMINISTERED BY PHARMACIST FOR IMMUNIZATION, Disp: , Rfl: 0 .  glipiZIDE (GLUCOTROL) 10 MG tablet, TAKE 1 TABLET BY MOUTH 2 TIMES DAILY BEFORE EVENING MEAL, Disp: 180 tablet, Rfl: 0 .  lisinopril (PRINIVIL,ZESTRIL) 20 MG tablet, TAKE 1 TABLET EVERY DAY, Disp: 90 tablet, Rfl: 3 .  metFORMIN (GLUCOPHAGE) 500 MG tablet, TAKE 1/2 TAB IN THE MORNING AND 1 TABLET IN THE EVENING., Disp: 135 tablet, Rfl: 0 .  Multiple Vitamin (MULTIVITAMIN) tablet, Take 1 tablet by mouth daily., Disp: , Rfl:  .  naproxen sodium (ALEVE) 220 MG tablet, Take 220 mg by mouth 2 (two) times daily as needed (for knee pain.)., Disp: , Rfl:  .  ONETOUCH VERIO test strip, USE AS INSTRUCTED TO CHECK BLOOD SUGAR ONCE A DAY, Disp: 100 each, Rfl: 3 .  OVER THE COUNTER MEDICATION, Probiotic extra strength, one capsule daily., Disp: , Rfl:  .  OVER THE COUNTER MEDICATION, ClearLax, One capful as needed. May take twice a week., Disp: , Rfl:  .  terbinafine (LAMISIL) 250 MG tablet, Take 1 tablet (250 mg total) by mouth  daily., Disp: 90 tablet, Rfl: 0  EXAM:  VITALS per patient if applicable:  GENERAL: alert, oriented, appears well and in no acute distress  HEENT: atraumatic, conjunttiva clear, no obvious abnormalities on inspection of external nose and ears  NECK: normal movements of the head and neck  LUNGS: on inspection no signs of respiratory distress, breathing rate appears normal, no obvious gross SOB, gasping or wheezing  CV: no obvious cyanosis  MS: moves all visible extremities without noticeable abnormality  PSYCH/NEURO: pleasant and cooperative, no obvious depression or anxiety, speech and thought processing grossly intact  ASSESSMENT AND PLAN:  Discussed the following assessment and plan:  Controlled type 2 diabetes mellitus with other circulatory complication, without long-term current use of insulin (HCC) - Plan: metFORMIN (GLUCOPHAGE) 500 MG tablet, glipiZIDE (GLUCOTROL) 10 MG tablet  Since his A1c has increased we discussed various options including increasing metformin.  He would like to work on diet and cutting out sweets since this is been the factor that has caused his A1c to increase.  He has been very well controlled in the past, I am okay with this plan.  We will follow-up in 3 months for retest.  If no improvement then increase metformin at that time    I discussed the assessment and treatment plan with the patient. The patient was provided an opportunity to ask questions and all were answered. The patient agreed with the plan and demonstrated an understanding of the instructions.   The patient was advised to call back or seek an in-person evaluation if the symptoms worsen or if the condition fails to improve as anticipated.   Dorothyann Peng, NP

## 2019-03-30 ENCOUNTER — Other Ambulatory Visit: Payer: Self-pay | Admitting: Adult Health

## 2019-03-30 DIAGNOSIS — E1159 Type 2 diabetes mellitus with other circulatory complications: Secondary | ICD-10-CM

## 2019-03-30 NOTE — Telephone Encounter (Signed)
Needs A1C and follow up 04/04/2019

## 2019-03-31 ENCOUNTER — Other Ambulatory Visit: Payer: Self-pay | Admitting: Adult Health

## 2019-03-31 DIAGNOSIS — E1159 Type 2 diabetes mellitus with other circulatory complications: Secondary | ICD-10-CM

## 2019-04-04 ENCOUNTER — Other Ambulatory Visit (INDEPENDENT_AMBULATORY_CARE_PROVIDER_SITE_OTHER): Payer: Medicare Other

## 2019-04-04 ENCOUNTER — Other Ambulatory Visit: Payer: Self-pay

## 2019-04-04 DIAGNOSIS — E1159 Type 2 diabetes mellitus with other circulatory complications: Secondary | ICD-10-CM | POA: Diagnosis not present

## 2019-04-04 LAB — BASIC METABOLIC PANEL
BUN: 18 mg/dL (ref 6–23)
CO2: 28 mEq/L (ref 19–32)
Calcium: 9.2 mg/dL (ref 8.4–10.5)
Chloride: 106 mEq/L (ref 96–112)
Creatinine, Ser: 0.92 mg/dL (ref 0.40–1.50)
GFR: 97.81 mL/min (ref >60.00)
Glucose, Bld: 83 mg/dL (ref 70–99)
Potassium: 4.2 mEq/L (ref 3.5–5.1)
Sodium: 142 mEq/L (ref 135–145)

## 2019-04-04 LAB — HEMOGLOBIN A1C: Hgb A1c MFr Bld: 6.3 % (ref 4.6–6.5)

## 2019-04-04 NOTE — Telephone Encounter (Signed)
Sent to the pharmacy by e-scribe. 

## 2019-04-05 ENCOUNTER — Encounter: Payer: Self-pay | Admitting: Adult Health

## 2019-04-05 ENCOUNTER — Ambulatory Visit (INDEPENDENT_AMBULATORY_CARE_PROVIDER_SITE_OTHER): Payer: Medicare Other | Admitting: Adult Health

## 2019-04-05 DIAGNOSIS — E1159 Type 2 diabetes mellitus with other circulatory complications: Secondary | ICD-10-CM | POA: Diagnosis not present

## 2019-04-05 NOTE — Progress Notes (Signed)
Virtual Visit via Video Note  I connected with Trevor Wheeler on 04/05/19 at  8:30 AM EDT by a video enabled telemedicine application and verified that I am speaking with the correct person using two identifiers.  Location patient: home Location provider:work or home office Persons participating in the virtual visit: patient, provider  I discussed the limitations of evaluation and management by telemedicine and the availability of in person appointments. The patient expressed understanding and agreed to proceed.   HPI: 73 year old male who is being evaluated today for follow-up regarding diabetes.  He is currently prescribed metformin 250 mg in the am and 500 mg in the pm as well as glipzide 10 mg BID.   When he was seen in April his A1c had increased to the highest it had been in many years, 7.6.  At this time he was eating a lot of sweets and carbs.  No medication changes were done as he wanted to work on his diet for 3 months.  Today he reports that he continues to walk his dog on a daily basis and is mowing 12 yards.  He is no longer eating carbs nor sweets.  He has been monitoring his blood sugars at home and reports readings between 70 and 120.  Overall he states I feel a lot better".  He has not had any hypoglycemic events    ROS: See pertinent positives and negatives per HPI.  Past Medical History:  Diagnosis Date  . Cataract   . CVA (cerebral vascular accident) ([place]) 05/04/2015   L cerebellar, "only problem I have is w/balance" 05/05/2015)  . Hypertension   . Type II diabetes mellitus ([place]) dx'd 05/05/2015    Past Surgical History:  Procedure Laterality Date  . Cataract Right 2018  . EP IMPLANTABLE DEVICE N/A 05/07/2015   Procedure: Loop Recorder Insertion;  Surgeon: Sanda Klein, MD;  Location: [facility];  Service: Cardiovascular;  Laterality: N/A;  . LOOP RECORDER REMOVAL N/A 09/28/2017   Procedure: LOOP RECORDER REMOVAL;  Surgeon: Sanda Klein, MD;  Location: [facility];  Service: Cardiovascular;  Laterality: N/A;  . NO PAST SURGERIES    . TEE WITHOUT CARDIOVERSION N/A 05/07/2015   Procedure: TRANSESOPHAGEAL ECHOCARDIOGRAM (TEE);  Surgeon: Skeet Latch, MD;  Location: [facility];  Service: Cardiovascular;  Laterality: N/A;    Family History  Problem Relation Age of Onset  . Heart disease Mother   . Colon cancer Brother   . Heart disease Brother   . Esophageal cancer Neg Hx   . Rectal cancer Neg Hx   . Stomach cancer Neg Hx       Current Outpatient Medications:  .  aspirin EC 325 MG tablet, Take 1 tablet (325 mg total) by mouth daily., Disp: 30 tablet, Rfl: 0 .  atorvastatin (LIPITOR) 20 MG tablet, Take 1 tablet (20 mg total) by mouth daily at 6 PM. FUTURE REFILLS MUST COME FROM PCP, Disp: 90 tablet, Rfl: 3 .  FLUAD 0.5 ML SUSY, TO BE ADMINISTERED BY PHARMACIST FOR IMMUNIZATION, Disp: , Rfl: 0 .  glipiZIDE (GLUCOTROL) 10 MG tablet, TAKE 1 TABLET BY MOUTH 2 TIMES DAILY BEFORE EVENING MEAL, Disp: 180 tablet, Rfl: 0 .  lisinopril (PRINIVIL,ZESTRIL) 20 MG tablet, TAKE 1 TABLET EVERY DAY, Disp: 90 tablet, Rfl: 3 .  metFORMIN (GLUCOPHAGE) 500 MG tablet, TAKE 1/2 TAB IN THE MORNING AND 1 TABLET IN THE EVENING., Disp: 135 tablet, Rfl: 0 .  Multiple Vitamin (MULTIVITAMIN) tablet, Take 1 tablet by mouth  daily., Disp: , Rfl:  .  naproxen sodium (ALEVE) 220 MG tablet, Take 220 mg by mouth 2 (two) times daily as needed (for knee pain.)., Disp: , Rfl:  .  ONETOUCH VERIO test strip, USE AS INSTRUCTED TO CHECK BLOOD SUGAR ONCE A DAY (E11.59), Disp: 100 strip, Rfl: 3 .  OVER THE COUNTER MEDICATION, Probiotic extra strength, one capsule daily., Disp: , Rfl:  .  OVER THE COUNTER MEDICATION, ClearLax, One capful as needed. May take twice a week., Disp: , Rfl:  .  terbinafine (LAMISIL) 250 MG tablet, Take 1 tablet (250 mg total) by mouth daily., Disp: 90 tablet, Rfl: 0  EXAM:  VITALS per patient if applicable:  GENERAL: alert, oriented, appears  well and in no acute distress  HEENT: atraumatic, conjunttiva clear, no obvious abnormalities on inspection of external nose and ears  NECK: normal movements of the head and neck  LUNGS: on inspection no signs of respiratory distress, breathing rate appears normal, no obvious gross SOB, gasping or wheezing  CV: no obvious cyanosis  MS: moves all visible extremities without noticeable abnormality  PSYCH/NEURO: pleasant and cooperative, no obvious depression or anxiety, speech and thought processing grossly intact  ASSESSMENT AND PLAN:  Discussed the following assessment and plan:  1. Controlled type 2 diabetes mellitus with other circulatory complication, without long-term current use of insulin (HCC) Lab Results  Component Value Date   HGBA1C 6.3 04/04/2019   -A1c has improved significantly.  No change in medications.  Continue with current plan of care.  We will retest at his CPE in the fall     I discussed the assessment and treatment plan with the patient. The patient was provided an opportunity to ask questions and all were answered. The patient agreed with the plan and demonstrated an understanding of the instructions.   The patient was advised to call back or seek an in-person evaluation if the symptoms worsen or if the condition fails to improve as anticipated.   Dorothyann Peng, NP

## 2019-07-08 ENCOUNTER — Other Ambulatory Visit: Payer: Self-pay | Admitting: Adult Health

## 2019-07-08 DIAGNOSIS — E1159 Type 2 diabetes mellitus with other circulatory complications: Secondary | ICD-10-CM

## 2019-07-11 ENCOUNTER — Ambulatory Visit (INDEPENDENT_AMBULATORY_CARE_PROVIDER_SITE_OTHER): Payer: Medicare Other | Admitting: Adult Health

## 2019-07-11 ENCOUNTER — Encounter: Payer: Self-pay | Admitting: Adult Health

## 2019-07-11 ENCOUNTER — Other Ambulatory Visit: Payer: Self-pay

## 2019-07-11 VITALS — BP 170/80 | Temp 98.0°F | Ht 70.5 in | Wt 235.0 lb

## 2019-07-11 DIAGNOSIS — Z0001 Encounter for general adult medical examination with abnormal findings: Secondary | ICD-10-CM | POA: Diagnosis not present

## 2019-07-11 DIAGNOSIS — Z125 Encounter for screening for malignant neoplasm of prostate: Secondary | ICD-10-CM

## 2019-07-11 DIAGNOSIS — M25562 Pain in left knee: Secondary | ICD-10-CM | POA: Diagnosis not present

## 2019-07-11 DIAGNOSIS — G8929 Other chronic pain: Secondary | ICD-10-CM | POA: Diagnosis not present

## 2019-07-11 DIAGNOSIS — I639 Cerebral infarction, unspecified: Secondary | ICD-10-CM

## 2019-07-11 DIAGNOSIS — I10 Essential (primary) hypertension: Secondary | ICD-10-CM

## 2019-07-11 DIAGNOSIS — Z8673 Personal history of transient ischemic attack (TIA), and cerebral infarction without residual deficits: Secondary | ICD-10-CM | POA: Diagnosis not present

## 2019-07-11 DIAGNOSIS — Z0000 Encounter for general adult medical examination without abnormal findings: Secondary | ICD-10-CM

## 2019-07-11 DIAGNOSIS — E782 Mixed hyperlipidemia: Secondary | ICD-10-CM

## 2019-07-11 DIAGNOSIS — E1159 Type 2 diabetes mellitus with other circulatory complications: Secondary | ICD-10-CM

## 2019-07-11 LAB — CBC WITH DIFFERENTIAL/PLATELET
Basophils Absolute: 0.1 10*3/uL (ref 0.0–0.1)
Basophils Relative: 0.7 % (ref 0.0–3.0)
Eosinophils Absolute: 0.2 10*3/uL (ref 0.0–0.7)
Eosinophils Relative: 2.6 % (ref 0.0–5.0)
HCT: 42 % (ref 39.0–52.0)
Hemoglobin: 13.4 g/dL (ref 13.0–17.0)
Lymphocytes Relative: 31.7 % (ref 12.0–46.0)
Lymphs Abs: 2.7 10*3/uL (ref 0.7–4.0)
MCHC: 31.8 g/dL (ref 30.0–36.0)
MCV: 85.3 fl (ref 78.0–100.0)
Monocytes Absolute: 0.7 10*3/uL (ref 0.1–1.0)
Monocytes Relative: 8 % (ref 3.0–12.0)
Neutro Abs: 4.8 10*3/uL (ref 1.4–7.7)
Neutrophils Relative %: 57 % (ref 43.0–77.0)
Platelets: 201 10*3/uL (ref 150.0–400.0)
RBC: 4.92 Mil/uL (ref 4.22–5.81)
RDW: 13.9 % (ref 11.5–15.5)
WBC: 8.4 10*3/uL (ref 4.0–10.5)

## 2019-07-11 LAB — COMPREHENSIVE METABOLIC PANEL
ALT: 20 U/L (ref 0–53)
AST: 24 U/L (ref 0–37)
Albumin: 4.4 g/dL (ref 3.5–5.2)
Alkaline Phosphatase: 104 U/L (ref 39–117)
BUN: 14 mg/dL (ref 6–23)
CO2: 28 mEq/L (ref 19–32)
Calcium: 9.6 mg/dL (ref 8.4–10.5)
Chloride: 104 mEq/L (ref 96–112)
Creatinine, Ser: 0.88 mg/dL (ref 0.40–1.50)
GFR: 102.88 mL/min (ref >60.00)
Glucose, Bld: 144 mg/dL — ABNORMAL HIGH (ref 70–99)
Potassium: 4.5 mEq/L (ref 3.5–5.1)
Sodium: 140 mEq/L (ref 135–145)
Total Bilirubin: 0.8 mg/dL (ref 0.2–1.2)
Total Protein: 6.8 g/dL (ref 6.0–8.3)

## 2019-07-11 LAB — LIPID PANEL
Cholesterol: 140 mg/dL (ref 0–200)
HDL: 37.6 mg/dL — ABNORMAL LOW (ref >39.00)
LDL Cholesterol: 79 mg/dL (ref 0–99)
NonHDL: 102.71
Total CHOL/HDL Ratio: 4
Triglycerides: 119 mg/dL (ref 0.0–149.0)
VLDL: 23.8 mg/dL (ref 0.0–40.0)

## 2019-07-11 LAB — PSA: PSA: 0.96 ng/mL (ref 0.10–4.00)

## 2019-07-11 LAB — HEMOGLOBIN A1C: Hgb A1c MFr Bld: 6.2 % (ref 4.6–6.5)

## 2019-07-11 LAB — TSH: TSH: 1.04 u[IU]/mL (ref 0.35–4.50)

## 2019-07-11 MED ORDER — METHYLPREDNISOLONE ACETATE 80 MG/ML IJ SUSP
80.0000 mg | Freq: Once | INTRAMUSCULAR | Status: AC
  Administered 2019-07-11: 80 mg via INTRA_ARTICULAR

## 2019-07-11 NOTE — Progress Notes (Signed)
Subjective:    Patient ID: Trevor Wheeler, male    DOB: [birth year], 73 y.o.   MRN: [id number]  HPI Patient presents for yearly preventative medicine examination. He is a pleasant 73 year old male who  has a past medical history of Cataract, CVA (cerebral vascular accident) ([place]) (05/04/2015), Hypertension, and Type II diabetes mellitus ([place]) (dx'd 05/05/2015).  DM-currently prescribed metformin 250 mg in the morning and 500 mg in the evening as well as glipizide 10 mg twice daily.  He does monitor his blood sugars at home and reports readings in the 70s to 100s in the morning and throughout the afternoon but increased to the 150s in the evening.  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 He has cut back on sugars and carbs  Lab Results  Component Value Date   HGBA1C 6.3 04/04/2019   Hypertension -currently prescribed lisinopril 20 mg.  He denies chest pain, shortness of breath, dizziness, lightheadedness, or syncopal episodes.He did not take his blood pressure medication today.   BP Readings from Last 3 Encounters:  07/11/19 (!) 170/80  12/05/18 109/62  09/28/18 134/80   Hyperlipidemia -currently prescribed Lipitor 20 mg daily.  Has had excellent control in the past.  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  He did not show atrial fibrillation during a 3-year monitoring and this was removed in 2019.  He has not had any neurological events since.  He was released from cardiology and advised to follow-up as needed.  Chronic Left knee pain - has had for many years but feels as though the pain has been getting worse. He reports  that he has been seen by orthopedics in the past and imaging was negative. They offered him a steroid injection at this time but he refused until he felt as though it was time. He would like to go forward with the knee injection at this time. Reports that pain is worse after doing exercises and mowing yard. He has some minimal loss of ROM when he brings his knee to his chest.   All immunizations and health maintenance protocols were reviewed with the patient and needed orders were placed. He is up to date on vaccinations   Appropriate screening laboratory values were ordered for the patient including screening of hyperlipidemia, renal function and hepatic function. If indicated by BPH, a PSA was ordered.  Medication reconciliation,  past medical history, social history, problem list and allergies were reviewed in detail with the patient  Goals were established with regard to weight loss, exercise, and  diet in compliance with medications  Wt Readings from Last 3 Encounters:  07/11/19 235 lb (106.6 kg)  12/05/18 242 lb (109.8 kg)  11/21/18 242 lb (109.8 kg)   End of life planning was discussed.  He is up-to-date on health maintenance screening such as dental and vision exams as well as routine colonoscopy.  Review of Systems  Constitutional: Negative.   HENT: Negative.   Eyes: Negative.   Respiratory: Negative.   Cardiovascular: Negative.   Gastrointestinal: Negative.   Endocrine: Negative.  Genitourinary: Negative.   Musculoskeletal: Positive for arthralgias.  Skin: Negative.   Allergic/Immunologic: Negative.   Neurological: Negative.   Hematological: Negative.   Psychiatric/Behavioral: Negative.   All other systems reviewed and are negative.  Past Medical History:  Diagnosis Date  . Cataract   . CVA (cerebral vascular accident) ([place]) 05/04/2015   L cerebellar, "only problem I have is w/balance" 05/05/2015)  . Hypertension   . Type II diabetes mellitus ([place]) dx'd 05/05/2015     Social History   Socioeconomic History  . Marital status: Married    Spouse name: Not on file  . Number of children: Not on file  . Years of education: Not on file  . Highest education level: Not on file  Occupational History  . Not on file  Social Needs  . Financial resource strain: Not on file  . Food insecurity    Worry: Not on file    Inability: Not on file  . Transportation needs    Medical: Not on file    Non-medical: Not on file  Tobacco Use  . Smoking status: Former Smoker    Packs/day: 1.00    Years: 20.00    Pack years: 20.00    Types: Cigarettes    Quit date: 05/21/1974    Years since quitting: 45.1  . Smokeless tobacco: Never Used  . Tobacco comment: quit smoking in the 1970's/ educated [employer]   Substance and Sexual Activity  . Alcohol use: No    Alcohol/week: 0.0 standard drinks  . Drug use: No  . Sexual activity: Not Currently  Lifestyle  . Physical activity    Days per week: Not on file    Minutes per session: Not on file  . Stress: Not on file  Relationships  . Social Herbalist on phone: Not on file    Gets together: Not on file    Attends religious service: Not on file    Active member of club or organization: Not on file    Attends meetings of clubs or organizations: Not on file    Relationship status: Not on file  . Intimate partner violence    Fear of current or ex partner: Not on file    Emotionally abused: Not on file    Physically abused: Not on file    Forced sexual activity: Not on file  Other Topics Concern  . Not on file  Social History Narrative   Employed as a Therapist, music.    Married for 30 years   Has two daughters who live locally.     Past Surgical History:  Procedure Laterality Date  . Cataract Right 2018  . EP IMPLANTABLE DEVICE N/A 05/07/2015   Procedure: Loop Recorder Insertion;  Surgeon: Sanda Klein, MD;  Location: [facility];  Service: Cardiovascular;  Laterality: N/A;  . LOOP  RECORDER REMOVAL N/A 09/28/2017   Procedure: LOOP RECORDER REMOVAL;  Surgeon: Sanda Klein, MD;  Location: [facility];  Service: Cardiovascular;  Laterality: N/A;  . NO PAST SURGERIES    . TEE WITHOUT CARDIOVERSION N/A 05/07/2015   Procedure: TRANSESOPHAGEAL ECHOCARDIOGRAM (TEE);  Surgeon: Skeet Latch, MD;  Location: [facility];  Service: Cardiovascular;  Laterality: N/A;    Family History  Problem Relation Age of Onset  . Heart disease Mother   . Colon cancer Brother   . Heart disease Brother   . Esophageal cancer Neg Hx   . Rectal cancer Neg Hx   . Stomach cancer  Neg Hx     Allergies  Allergen Reactions  . Penicillins Hives    Has patient had a PCN reaction causing immediate rash, facial/tongue/throat swelling, SOB or lightheadedness with hypotension: No Has patient had a PCN reaction causing severe rash involving mucus membranes or skin necrosis: No Has patient had a PCN reaction that required hospitalization: No Has patient had a PCN reaction occurring within the last 10 years: No If all of the above answers are "NO", then may proceed with Cephalosporin use.     Current Outpatient Medications on File Prior to Visit  Medication Sig Dispense Refill  . aspirin EC 325 MG tablet Take 1 tablet (325 mg total) by mouth daily. 30 tablet 0  . atorvastatin (LIPITOR) 20 MG tablet Take 1 tablet (20 mg total) by mouth daily at 6 PM. FUTURE REFILLS MUST COME FROM PCP 90 tablet 3  . FLUAD 0.5 ML SUSY TO BE ADMINISTERED BY PHARMACIST FOR IMMUNIZATION  0  . glipiZIDE (GLUCOTROL) 10 MG tablet TAKE 1 TABLET BY MOUTH 2 TIMES DAILY BEFORE EVENING MEAL 180 tablet 0  . lisinopril (PRINIVIL,ZESTRIL) 20 MG tablet TAKE 1 TABLET EVERY DAY 90 tablet 3  . metFORMIN (GLUCOPHAGE) 500 MG tablet TAKE 1/2 TAB IN THE MORNING AND 1 TABLET IN THE EVENING. 135 tablet 0  . Multiple Vitamin (MULTIVITAMIN) tablet Take 1 tablet by mouth daily.    . naproxen sodium (ALEVE) 220 MG tablet Take 220 mg by  mouth 2 (two) times daily as needed (for knee pain.).    Marland Kitchen ONETOUCH VERIO test strip USE AS INSTRUCTED TO CHECK BLOOD SUGAR ONCE A DAY (E11.59) 100 strip 3  . OVER THE COUNTER MEDICATION Probiotic extra strength, one capsule daily.    Marland Kitchen OVER THE COUNTER MEDICATION ClearLax, One capful as needed. May take twice a week.    . terbinafine (LAMISIL) 250 MG tablet Take 1 tablet (250 mg total) by mouth daily. 90 tablet 0   No current facility-administered medications on file prior to visit.     BP (!) 170/80 Comment: NO MEDS  Temp 98 F (36.7 C) (Temporal)   Ht 5' 10.5" (1.791 m)   Wt 235 lb (106.6 kg)   BMI 33.24 kg/m       Objective:   Physical Exam Vitals signs and nursing note reviewed.  Constitutional:      General: He is not in acute distress.    Appearance: Normal appearance. He is well-developed and overweight. He is not diaphoretic.  HENT:     Head: Normocephalic and atraumatic.     Right Ear: Tympanic membrane, ear canal and external ear normal. There is no impacted cerumen.     Left Ear: Tympanic membrane, ear canal and external ear normal. There is no impacted cerumen.     Nose: Nose normal. No congestion or rhinorrhea.     Mouth/Throat:     Mouth: Mucous membranes are moist.     Pharynx: Oropharynx is clear. No oropharyngeal exudate or posterior oropharyngeal erythema.  Eyes:     General:        Right eye: No discharge.        Left eye: No discharge.     Conjunctiva/sclera: Conjunctivae normal.     Pupils: Pupils are equal, round, and reactive to light.  Neck:     Musculoskeletal: Neck supple.     Thyroid: No thyromegaly.     Vascular: No carotid bruit.     Trachea: No tracheal deviation.  Cardiovascular:  Rate and Rhythm: Normal rate and regular rhythm.     Heart sounds: Normal heart sounds. No murmur. No friction rub. No gallop.   Pulmonary:     Effort: Pulmonary effort is normal. No respiratory distress.     Breath sounds: Normal breath sounds. No stridor.  No wheezing or rales.  Chest:     Chest wall: No tenderness.  Abdominal:     General: Bowel sounds are normal. There is no distension.     Palpations: Abdomen is soft. There is no mass.     Tenderness: There is no abdominal tenderness. There is no right CVA tenderness, left CVA tenderness, guarding or rebound.     Hernia: No hernia is present.  Musculoskeletal: Normal range of motion.        General: No swelling, tenderness, deformity or signs of injury.     Right lower leg: No edema.     Left lower leg: No edema.  Lymphadenopathy:     Cervical: No cervical adenopathy.  Skin:    General: Skin is warm and dry.     Coloration: Skin is not jaundiced or pale.     Findings: No bruising, erythema, lesion or rash.  Neurological:     General: No focal deficit present.     Mental Status: He is alert and oriented to person, place, and time. Mental status is at baseline.     Cranial Nerves: No cranial nerve deficit.     Sensory: No sensory deficit.     Motor: No weakness.     Coordination: Coordination normal.     Gait: Gait normal.     Deep Tendon Reflexes: Reflexes normal.  Psychiatric:        Mood and Affect: Mood normal.        Behavior: Behavior normal.        Thought Content: Thought content normal.        Judgment: Judgment normal.        Assessment & Plan:  1. Routine general medical examination at a health care facility - Congratulated on weight loss. Advised to continue to work on diet and exercise to lose more weight  - follow up in one year or sooner if needed - CBC with Differential/Platelet - Comprehensive metabolic panel - Hemoglobin A1c - Lipid panel - TSH  2. Controlled type 2 diabetes mellitus with other circulatory complication, without long-term current use of insulin (HCC) - Continue with current regimen. Likely follow up in 6 months  - CBC with Differential/Platelet - Comprehensive metabolic panel - Hemoglobin A1c - Lipid panel - TSH  3. Mixed  hyperlipidemia - Continue statin and ASA - CBC with Differential/Platelet - Comprehensive metabolic panel - Hemoglobin A1c - Lipid panel - TSH  4. Essential hypertension - Well controlled but had not taken his medications yet this morning  - CBC with Differential/Platelet - Comprehensive metabolic panel - Hemoglobin A1c - Lipid panel - TSH  5. Prostate cancer screening  - PSA  6. Cryptogenic stroke ([place]) - Continue with ASA and statin - CBC with Differential/Platelet - Comprehensive metabolic panel - Hemoglobin A1c - Lipid panel - TSH  7. Chronic pain of left knee Discussed risks and benefits of corticosteroid injection and patient consented.  After prepping skin with betadine, injected 80 mg depomedrol and 2 cc of plain xylocaine with 22 gauge one and one half inch needle using anterolateral approach and pt tolerated well. - methylPREDNISolone acetate (DEPO-MEDROL) injection 80 mg - Follow up if no improvement  in the next week   Dorothyann Peng, NP

## 2019-09-18 ENCOUNTER — Telehealth: Payer: Self-pay | Admitting: Adult Health

## 2019-09-18 NOTE — Telephone Encounter (Signed)
Patient states on January 2nd he will need a new monitor and test strips called in.  Verio strips and machine will no longer be covered.  He will need Accuchek or Trivida to be called in.  Pharmacy:  [facility]

## 2019-09-25 ENCOUNTER — Other Ambulatory Visit: Payer: Self-pay

## 2019-09-25 DIAGNOSIS — E1159 Type 2 diabetes mellitus with other circulatory complications: Secondary | ICD-10-CM

## 2019-09-25 MED ORDER — ACCU-CHEK AVIVA PLUS W/DEVICE KIT: PACK | 0 refills | Status: DC

## 2019-09-25 MED ORDER — GLUCOSE BLOOD VI STRP: ORAL_STRIP | 0 refills | Status: DC

## 2019-09-25 NOTE — Telephone Encounter (Signed)
RX sent to pharmacy. Tried to call pt to advise of update but no answer. No further action needed.

## 2019-10-24 ENCOUNTER — Other Ambulatory Visit: Payer: Self-pay | Admitting: Adult Health

## 2019-10-24 DIAGNOSIS — E1159 Type 2 diabetes mellitus with other circulatory complications: Secondary | ICD-10-CM

## 2019-10-31 ENCOUNTER — Other Ambulatory Visit: Payer: Self-pay

## 2019-10-31 DIAGNOSIS — E1159 Type 2 diabetes mellitus with other circulatory complications: Secondary | ICD-10-CM

## 2019-10-31 MED ORDER — ACCU-CHEK GUIDE ME W/DEVICE KIT: 3.0000 | PACK | Freq: Three times a day (TID) | 0 refills | Status: DC

## 2019-10-31 MED ORDER — ACCU-CHEK SOFT TOUCH LANCETS MISC: 12 refills | Status: DC

## 2019-10-31 MED ORDER — GLUCOSE BLOOD VI STRP: ORAL_STRIP | 0 refills | Status: DC

## 2019-11-05 LAB — HM DIABETES EYE EXAM

## 2019-11-06 ENCOUNTER — Encounter: Payer: Self-pay | Admitting: Adult Health

## 2019-11-09 ENCOUNTER — Other Ambulatory Visit: Payer: Self-pay | Admitting: Adult Health

## 2019-11-09 DIAGNOSIS — E1159 Type 2 diabetes mellitus with other circulatory complications: Secondary | ICD-10-CM

## 2019-11-14 ENCOUNTER — Other Ambulatory Visit: Payer: Self-pay | Admitting: Cardiovascular Disease

## 2019-12-07 ENCOUNTER — Other Ambulatory Visit: Payer: Self-pay | Admitting: Cardiovascular Disease

## 2019-12-10 ENCOUNTER — Other Ambulatory Visit: Payer: Self-pay | Admitting: Adult Health

## 2019-12-10 DIAGNOSIS — E1159 Type 2 diabetes mellitus with other circulatory complications: Secondary | ICD-10-CM

## 2019-12-11 NOTE — Telephone Encounter (Signed)
Rx was sent in on 10/31/2019

## 2020-01-01 ENCOUNTER — Other Ambulatory Visit: Payer: Self-pay | Admitting: Adult Health

## 2020-01-01 DIAGNOSIS — E1159 Type 2 diabetes mellitus with other circulatory complications: Secondary | ICD-10-CM

## 2020-01-01 NOTE — Telephone Encounter (Signed)
SENT TO THE PHARMACY BY E-SCRIBE. 

## 2020-01-06 ENCOUNTER — Other Ambulatory Visit: Payer: Self-pay | Admitting: Cardiovascular Disease

## 2020-01-10 ENCOUNTER — Other Ambulatory Visit: Payer: Self-pay

## 2020-01-11 ENCOUNTER — Ambulatory Visit: Payer: Medicare PPO | Admitting: Adult Health

## 2020-01-11 ENCOUNTER — Encounter: Payer: Self-pay | Admitting: Adult Health

## 2020-01-11 VITALS — BP 116/80 | Temp 97.3°F | Wt 233.0 lb

## 2020-01-11 DIAGNOSIS — E1159 Type 2 diabetes mellitus with other circulatory complications: Secondary | ICD-10-CM

## 2020-01-11 DIAGNOSIS — I10 Essential (primary) hypertension: Secondary | ICD-10-CM | POA: Diagnosis not present

## 2020-01-11 DIAGNOSIS — E785 Hyperlipidemia, unspecified: Secondary | ICD-10-CM | POA: Diagnosis not present

## 2020-01-11 LAB — POCT GLYCOSYLATED HEMOGLOBIN (HGB A1C): HbA1c, POC (controlled diabetic range): 6.9 % (ref 0.0–7.0)

## 2020-01-11 MED ORDER — ATORVASTATIN CALCIUM 20 MG PO TABS: 20.0000 mg | ORAL_TABLET | Freq: Every day | ORAL | 1 refills | Status: DC

## 2020-01-11 MED ORDER — GLIPIZIDE 10 MG PO TABS: ORAL_TABLET | ORAL | 1 refills | Status: DC

## 2020-01-11 MED ORDER — LISINOPRIL 20 MG PO TABS: 20.0000 mg | ORAL_TABLET | Freq: Every day | ORAL | 1 refills | Status: DC

## 2020-01-11 MED ORDER — METFORMIN HCL 500 MG PO TABS: ORAL_TABLET | ORAL | 1 refills | Status: DC

## 2020-01-11 NOTE — Progress Notes (Signed)
Subjective:    Patient ID: Trevor Wheeler, male    DOB: 01-20-1946, 74 y.o.   MRN: [id number]  HPI 74 year old male who  has a past medical history of Cataract, CVA (cerebral vascular accident) ([place]) (05/04/2015), Hypertension, and Type II diabetes mellitus ([place]) (dx'd 05/05/2015).  He presents to the office today for 74-month follow-up regarding diabetes mellitus.  Is currently prescribed Metformin 250 mg in the a.m. and 500 mg in the p.m., as well as glipizide 10 mg twice daily.  His last A1c in October 2020 was 6.2.  He does report that he continues to eat fairly healthy and stays active with mowing yards and walking his dog.  He does  monitor his blood sugars on a routine basis and the highest he has seen was 150, they usually run between but he denies any episodes of hypoglycemia  Lab Results  Component Value Date   HGBA1C 6.2 07/11/2019   He also needs to have his lisinopril and Lipitor refilled.     Review of Systems See HPI   Past Medical History:  Diagnosis Date  . Cataract   . CVA (cerebral vascular accident) ([place]) 05/04/2015   L cerebellar, "only problem I have is w/balance" 05/05/2015)  . Hypertension   . Type II diabetes mellitus ([place]) dx'd 05/05/2015    Social History   Socioeconomic History  . Marital status: Married    Spouse name: Not on file  . Number of children: Not on file  . Years of education: Not on file  . Highest education level: Not on file  Occupational History  . Not on file  Tobacco Use  . Smoking status: Former Smoker    Packs/day: 1.00    Years: 20.00    Pack years: 20.00    Types: Cigarettes    Quit date: 05/21/1974    Years since quitting: 45.6  . Smokeless tobacco: Never Used  . Tobacco comment: quit smoking in the 1970's/ educated [employer]   Substance and Sexual Activity  . Alcohol use: No    Alcohol/week: 0.0 standard drinks  . Drug use: No  . Sexual activity: Not Currently  Other Topics Concern  . Not on file  Social History Narrative    Employed as a Therapist, music.    Married for 30 years   Has two daughters who live locally.    Social Determinants of Health   Financial Resource Strain:   . Difficulty of Paying Living Expenses:   Food Insecurity:   . Worried About Charity fundraiser in the Last Year:   . Arboriculturist in the Last Year:   Transportation Needs:   . Film/video editor (Medical):   Marland Kitchen Lack of Transportation (Non-Medical):   Physical Activity:   . Days of Exercise per Week:   . Minutes of Exercise per Session:   Stress:   . Feeling of Stress :   Social Connections:   . Frequency of Communication with Friends and Family:   . Frequency of Social Gatherings with Friends and Family:   . Attends Religious Services:   . Active Member of Clubs or Organizations:   . Attends Archivist Meetings:   Marland Kitchen Marital Status:   Intimate Partner Violence:   . Fear of Current or Ex-Partner:   . Emotionally Abused:   Marland Kitchen Physically Abused:   . Sexually Abused:     Past Surgical History:  Procedure Laterality Date  . Cataract Right 2018  .  EP IMPLANTABLE DEVICE N/A 05/07/2015   Procedure: Loop Recorder Insertion;  Surgeon: Sanda Klein, MD;  Location: [facility];  Service: Cardiovascular;  Laterality: N/A;  . LOOP RECORDER REMOVAL N/A 09/28/2017   Procedure: LOOP RECORDER REMOVAL;  Surgeon: Sanda Klein, MD;  Location: [facility];  Service: Cardiovascular;  Laterality: N/A;  . NO PAST SURGERIES    . TEE WITHOUT CARDIOVERSION N/A 05/07/2015   Procedure: TRANSESOPHAGEAL ECHOCARDIOGRAM (TEE);  Surgeon: Skeet Latch, MD;  Location: [facility];  Service: Cardiovascular;  Laterality: N/A;    Family History  Problem Relation Age of Onset  . Heart disease Mother   . Colon cancer Brother   . Heart disease Brother   . Esophageal cancer Neg Hx   . Rectal cancer Neg Hx   . Stomach cancer Neg Hx     Allergies  Allergen Reactions  . Penicillins Hives    Has patient had  a PCN reaction causing immediate rash, facial/tongue/throat swelling, SOB or lightheadedness with hypotension: No Has patient had a PCN reaction causing severe rash involving mucus membranes or skin necrosis: No Has patient had a PCN reaction that required hospitalization: No Has patient had a PCN reaction occurring within the last 10 years: No If all of the above answers are "NO", then may proceed with Cephalosporin use.     Current Outpatient Medications on File Prior to Visit  Medication Sig Dispense Refill  . aspirin EC 325 MG tablet Take 1 tablet (325 mg total) by mouth daily. 30 tablet 0  . atorvastatin (LIPITOR) 20 MG tablet Take 1 tablet (20 mg total) by mouth daily at 6 PM. FUTURE REFILLS MUST COME FROM PCP 90 tablet 3  . glipiZIDE (GLUCOTROL) 10 MG tablet TAKE 1 TABLET BY MOUTH 2 TIMES DAILY BEFORE EVENING MEAL 180 tablet 0  . glucose blood test strip USED TO CHECK BLOOD SUGAR 3 TIMES DAILY OR PRN 100 each 0  . Lancets (ACCU-CHEK SOFT TOUCH) lancets Used to check blood sugar 3 times daily 100 each 12  . lisinopril (ZESTRIL) 20 MG tablet Take 1 tablet (20 mg total) by mouth daily. **Patient needs OV for future refills** 30 tablet 1  . metFORMIN (GLUCOPHAGE) 500 MG tablet TAKE 1/2 TAB IN THE MORNING AND 1 TABLET IN THE EVENING. 135 tablet 0  . Multiple Vitamin (MULTIVITAMIN) tablet Take 1 tablet by mouth daily.    . naproxen sodium (ALEVE) 220 MG tablet Take 220 mg by mouth 2 (two) times daily as needed (for knee pain.).    Marland Kitchen OVER THE COUNTER MEDICATION Probiotic extra strength, one capsule daily.    Marland Kitchen OVER THE COUNTER MEDICATION ClearLax, One capful as needed. May take twice a week.     No current facility-administered medications on file prior to visit.    BP 116/80   Temp (!) 97.3 F (36.3 C)   Wt 233 lb (105.7 kg)   BMI 32.96 kg/m       Objective:   Physical Exam Vitals and nursing note reviewed.  Constitutional:      Appearance: Normal appearance.  Cardiovascular:      Rate and Rhythm: Normal rate and regular rhythm.     Pulses: Normal pulses.     Heart sounds: Normal heart sounds.  Pulmonary:     Effort: Pulmonary effort is normal.     Breath sounds: Normal breath sounds.  Musculoskeletal:        General: Normal range of motion.  Skin:    General:  Skin is warm and dry.  Neurological:     General: No focal deficit present.     Mental Status: He is alert and oriented to person, place, and time.  Psychiatric:        Mood and Affect: Mood normal.        Behavior: Behavior normal.        Thought Content: Thought content normal.        Judgment: Judgment normal.       Assessment & Plan:  1. 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  Dispense: 135 tablet; Refill: 1 - glipiZIDE (GLUCOTROL) 10 MG tablet; TAKE 1 TABLET BY MOUTH 2 TIMES DAILY BEFORE EVENING MEAL  Dispense: 180 tablet; Refill: 1 - Continue to stay active and exercise - Follow up in 6 months for Cpe  2. Hyperlipidemia, unspecified hyperlipidemia type  - atorvastatin (LIPITOR) 20 MG tablet; Take 1 tablet (20 mg total) by mouth daily at 6 PM.  Dispense: 90 tablet; Refill: 1  3. Essential hypertension  - lisinopril (ZESTRIL) 20 MG tablet; Take 1 tablet (20 mg total) by mouth daily.  Dispense: 90 tablet; Refill: 1  Dorothyann Peng, NP

## 2020-01-11 NOTE — Patient Instructions (Signed)
Your A1c was 6.9 - keep up the good work   Please schedule your physical after October 21  Let me know if you need anything

## 2020-07-06 ENCOUNTER — Other Ambulatory Visit: Payer: Self-pay | Admitting: Adult Health

## 2020-07-06 DIAGNOSIS — E785 Hyperlipidemia, unspecified: Secondary | ICD-10-CM

## 2020-07-06 DIAGNOSIS — I10 Essential (primary) hypertension: Secondary | ICD-10-CM

## 2020-07-11 ENCOUNTER — Encounter: Payer: Medicare PPO | Admitting: Adult Health

## 2020-07-30 ENCOUNTER — Encounter: Payer: Self-pay | Admitting: Adult Health

## 2020-07-30 ENCOUNTER — Ambulatory Visit (INDEPENDENT_AMBULATORY_CARE_PROVIDER_SITE_OTHER): Payer: Medicare PPO | Admitting: Adult Health

## 2020-07-30 ENCOUNTER — Other Ambulatory Visit: Payer: Self-pay

## 2020-07-30 ENCOUNTER — Ambulatory Visit (INDEPENDENT_AMBULATORY_CARE_PROVIDER_SITE_OTHER): Payer: Medicare PPO

## 2020-07-30 VITALS — BP 160/90 | HR 64 | Temp 98.4°F | Ht 70.25 in | Wt 236.8 lb

## 2020-07-30 DIAGNOSIS — E782 Mixed hyperlipidemia: Secondary | ICD-10-CM | POA: Diagnosis not present

## 2020-07-30 DIAGNOSIS — I10 Essential (primary) hypertension: Secondary | ICD-10-CM

## 2020-07-30 DIAGNOSIS — E785 Hyperlipidemia, unspecified: Secondary | ICD-10-CM

## 2020-07-30 DIAGNOSIS — E1159 Type 2 diabetes mellitus with other circulatory complications: Secondary | ICD-10-CM

## 2020-07-30 DIAGNOSIS — M25552 Pain in left hip: Secondary | ICD-10-CM | POA: Diagnosis not present

## 2020-07-30 DIAGNOSIS — M1712 Unilateral primary osteoarthritis, left knee: Secondary | ICD-10-CM

## 2020-07-30 DIAGNOSIS — Z0000 Encounter for general adult medical examination without abnormal findings: Secondary | ICD-10-CM | POA: Diagnosis not present

## 2020-07-30 DIAGNOSIS — B351 Tinea unguium: Secondary | ICD-10-CM

## 2020-07-30 DIAGNOSIS — I639 Cerebral infarction, unspecified: Secondary | ICD-10-CM

## 2020-07-30 DIAGNOSIS — M25562 Pain in left knee: Secondary | ICD-10-CM

## 2020-07-30 DIAGNOSIS — Z125 Encounter for screening for malignant neoplasm of prostate: Secondary | ICD-10-CM

## 2020-07-30 LAB — COMPREHENSIVE METABOLIC PANEL
ALT: 40 U/L (ref 0–53)
AST: 31 U/L (ref 0–37)
Albumin: 4.3 g/dL (ref 3.5–5.2)
Alkaline Phosphatase: 94 U/L (ref 39–117)
BUN: 14 mg/dL (ref 6–23)
CO2: 28 mEq/L (ref 19–32)
Calcium: 9.2 mg/dL (ref 8.4–10.5)
Chloride: 103 mEq/L (ref 96–112)
Creatinine, Ser: 0.89 mg/dL (ref 0.40–1.50)
GFR: 85.04 mL/min (ref >60.00)
Glucose, Bld: 139 mg/dL — ABNORMAL HIGH (ref 70–99)
Potassium: 4.3 mEq/L (ref 3.5–5.1)
Sodium: 139 mEq/L (ref 135–145)
Total Bilirubin: 0.8 mg/dL (ref 0.2–1.2)
Total Protein: 6.5 g/dL (ref 6.0–8.3)

## 2020-07-30 LAB — LIPID PANEL
Cholesterol: 132 mg/dL (ref 0–200)
HDL: 32.4 mg/dL — ABNORMAL LOW (ref >39.00)
LDL Cholesterol: 76 mg/dL (ref 0–99)
NonHDL: 99.57
Total CHOL/HDL Ratio: 4
Triglycerides: 120 mg/dL (ref 0.0–149.0)
VLDL: 24 mg/dL (ref 0.0–40.0)

## 2020-07-30 LAB — CBC WITH DIFFERENTIAL/PLATELET
Basophils Absolute: 0.1 10*3/uL (ref 0.0–0.1)
Basophils Relative: 0.7 % (ref 0.0–3.0)
Eosinophils Absolute: 0.2 10*3/uL (ref 0.0–0.7)
Eosinophils Relative: 2.6 % (ref 0.0–5.0)
HCT: 42.9 % (ref 39.0–52.0)
Hemoglobin: 13.8 g/dL (ref 13.0–17.0)
Lymphocytes Relative: 38.1 % (ref 12.0–46.0)
Lymphs Abs: 2.6 10*3/uL (ref 0.7–4.0)
MCHC: 32.2 g/dL (ref 30.0–36.0)
MCV: 84.3 fl (ref 78.0–100.0)
Monocytes Absolute: 0.6 10*3/uL (ref 0.1–1.0)
Monocytes Relative: 8.1 % (ref 3.0–12.0)
Neutro Abs: 3.5 10*3/uL (ref 1.4–7.7)
Neutrophils Relative %: 50.5 % (ref 43.0–77.0)
Platelets: 191 10*3/uL (ref 150.0–400.0)
RBC: 5.09 Mil/uL (ref 4.22–5.81)
RDW: 13.7 % (ref 11.5–15.5)
WBC: 6.9 10*3/uL (ref 4.0–10.5)

## 2020-07-30 LAB — TSH: TSH: 1.06 u[IU]/mL (ref 0.35–4.50)

## 2020-07-30 LAB — PSA: PSA: 0.8 ng/mL (ref 0.10–4.00)

## 2020-07-30 LAB — HEMOGLOBIN A1C: Hgb A1c MFr Bld: 7.2 % — ABNORMAL HIGH (ref 4.6–6.5)

## 2020-07-30 MED ORDER — METFORMIN HCL 500 MG PO TABS: ORAL_TABLET | ORAL | 1 refills | Status: DC

## 2020-07-30 MED ORDER — LISINOPRIL 20 MG PO TABS: 20.0000 mg | ORAL_TABLET | Freq: Every day | ORAL | 3 refills | Status: DC

## 2020-07-30 MED ORDER — TERBINAFINE HCL 250 MG PO TABS: 250.0000 mg | ORAL_TABLET | Freq: Every day | ORAL | 0 refills | Status: DC

## 2020-07-30 MED ORDER — ACCU-CHEK SOFT TOUCH LANCETS MISC: 12 refills | Status: AC

## 2020-07-30 MED ORDER — ATORVASTATIN CALCIUM 20 MG PO TABS: 20.0000 mg | ORAL_TABLET | Freq: Every day | ORAL | 3 refills | Status: DC

## 2020-07-30 MED ORDER — GLUCOSE BLOOD VI STRP: ORAL_STRIP | 0 refills | Status: DC

## 2020-07-30 MED ORDER — GLIPIZIDE 10 MG PO TABS: ORAL_TABLET | ORAL | 1 refills | Status: DC

## 2020-07-30 NOTE — Progress Notes (Signed)
Subjective:    Patient ID: Trevor Wheeler, male    DOB: 17-Feb-1946, 74 y.o.   MRN: [id number]  HPI Patient presents for yearly preventative medicine examination. He is a very pleasant 74 year old male who  has a past medical history of Cataract, CVA (cerebral vascular accident) ([place]) (05/04/2015), Hypertension, and Type II diabetes mellitus ([place]) (dx'd 05/05/2015).  DM -currently maintained on Metformin 250 mg in the morning, 500 mg in the evening as well as glipizide 10 mg twice daily. He does occasionally monitor his blood sugars at home and reports readings in the 70s to 100s in the morning and throughout the afternoon but increases into the 150s in the evening. He continues to walk his dog on a daily basis and stays active with yard maintenance company. He has been working on cutting back on sugars and carbs  Lab Results  Component Value Date   HGBA1C 6.9 01/11/2020    Essential hypertension-is currently prescribed lisinopril 20 mg. He denies chest pain, shortness of breath, dizziness, lightheadedness, or syncopal episodes. He did not take his BP medications this morning prior to his CPE  BP Readings from Last 3 Encounters:  07/30/20 (!) 160/90  01/11/20 116/80  07/11/19 (!) 170/80    Hyperlipidemia-currently prescribed Lipitor 20 mg daily. He has had excellent control of his lipids in the past. Denies myalgia or fatigue  Lab Results  Component Value Date   CHOL 140 07/11/2019   HDL 37.60 (L) 07/11/2019   LDLCALC 79 07/11/2019   LDLDIRECT 182.2 08/05/2010   TRIG 119.0 07/11/2019   CHOLHDL 4 07/11/2019    S/P cryptogenic stroke-currently on aspirin. He had a implantable loop recorder that was placed when he had a left cerebral stroke in August 2016. During a 3-year monitoring it did not show atrial fibrillation and his loop recorder was removed in 2019. He has not had any neurological or cardiac events since. He was released from cardiology and advised to follow-up as  needed  Acute Issues  Left knee and hip pain -symptoms have been present for roughly 4 months and have not improved.  He reports that he was working in his yard over the summer and was carrying yard debris to the curb for pickup, instead of walking down the driveway he decided to walk through the yard where there is a slant, he lost his footing and had to start running in order to prevent a fall, he ended up jumping over bags of long debris and when he came down he injured his left knee and left hip.  He has been using Motrin and sports creams without resolution in his pain.  He does often feel unsteady with walking and oftentimes his knee will feel like it is "going to give out".  He has bought a neoprene knee brace to provide support and has found this beneficial.  All immunizations and health maintenance protocols were reviewed with the patient and needed orders were placed.He is up to date on routine vaccinations    Appropriate screening laboratory values were ordered for the patient including screening of hyperlipidemia, renal function and hepatic function. If indicated by BPH, a PSA was ordered.  Medication reconciliation,  past medical history, social history, problem list and allergies were reviewed in detail with the patient  Goals were established with regard to weight loss, exercise, and  diet in compliance with medications Wt Readings from Last 3 Encounters:  07/30/20 236 lb 12.8 oz (107.4 kg)  01/11/20 233 lb (  105.7 kg)  07/11/19 235 lb (106.6 kg)   Is up-to-date on routine colon cancer screening and diabetic eye exam.   Review of Systems  Constitutional: Negative.   HENT: Negative.   Eyes: Negative.   Respiratory: Negative.   Cardiovascular: Negative.   Gastrointestinal: Negative.   Endocrine: Negative.   Genitourinary: Negative.   Musculoskeletal: Positive for arthralgias and gait problem.  Skin: Negative.   Allergic/Immunologic: Negative.   Hematological: Negative.    Psychiatric/Behavioral: Negative.   All other systems reviewed and are negative.  Past Medical History:  Diagnosis Date   Cataract    CVA (cerebral vascular accident) ([place]) 05/04/2015   L cerebellar, "only problem I have is w/balance" 05/05/2015)   Hypertension    Type II diabetes mellitus ([place]) dx'd 05/05/2015    Social History   Socioeconomic History   Marital status: Married    Spouse name: Not on file   Number of children: Not on file   Years of education: Not on file   Highest education level: Not on file  Occupational History   Not on file  Tobacco Use   Smoking status: Former Smoker    Packs/day: 1.00    Years: 20.00    Pack years: 20.00    Types: Cigarettes    Quit date: 05/21/1974    Years since quitting: 46.2   Smokeless tobacco: Never Used   Tobacco comment: quit smoking in the 1970's/ educated [employer]   Substance and Sexual Activity   Alcohol use: No    Alcohol/week: 0.0 standard drinks   Drug use: No   Sexual activity: Not Currently  Other Topics Concern   Not on file  Social History Narrative   Employed as a Therapist, music.    Married for 30 years   Has two daughters who live locally.    Social Determinants of Health   Financial Resource Strain:    Difficulty of Paying Living Expenses: Not on file  Food Insecurity:    Worried About Charity fundraiser in the Last Year: Not on file   [employer] of Food in the Last Year: Not on file  Transportation Needs:    Lack of Transportation (Medical): Not on file   Lack of Transportation (Non-Medical): Not on file  Physical Activity:    Days of Exercise per Week: Not on file   Minutes of Exercise per Session: Not on file  Stress:    Feeling of Stress : Not on file  Social Connections:    Frequency of Communication with Friends and Family: Not on file   Frequency of Social Gatherings with Friends and Family: Not on file   Attends Religious Services: Not on file   Active Member  of Clubs or Organizations: Not on file   Attends Archivist Meetings: Not on file   Marital Status: Not on file  Intimate Partner Violence:    Fear of Current or Ex-Partner: Not on file   Emotionally Abused: Not on file   Physically Abused: Not on file   Sexually Abused: Not on file    Past Surgical History:  Procedure Laterality Date   Cataract Right 2018   EP IMPLANTABLE DEVICE N/A 05/07/2015   Procedure: Loop Recorder Insertion;  Surgeon: Sanda Klein, MD;  Location: [facility];  Service: Cardiovascular;  Laterality: N/A;   LOOP RECORDER REMOVAL N/A 09/28/2017   Procedure: LOOP RECORDER REMOVAL;  Surgeon: Sanda Klein, MD;  Location: [facility];  Service: Cardiovascular;  Laterality: N/A;   NO PAST SURGERIES     TEE WITHOUT CARDIOVERSION N/A 05/07/2015   Procedure: TRANSESOPHAGEAL ECHOCARDIOGRAM (TEE);  Surgeon: Skeet Latch, MD;  Location: [facility];  Service: Cardiovascular;  Laterality: N/A;    Family History  Problem Relation Age of Onset   Heart disease Mother    Colon cancer Brother    Heart disease Brother    Esophageal cancer Neg Hx    Rectal cancer Neg Hx    Stomach cancer Neg Hx     Allergies  Allergen Reactions   Penicillins Hives    Has patient had a PCN reaction causing immediate rash, facial/tongue/throat swelling, SOB or lightheadedness with hypotension: No Has patient had a PCN reaction causing severe rash involving mucus membranes or skin necrosis: No Has patient had a PCN reaction that required hospitalization: No Has patient had a PCN reaction occurring within the last 10 years: No If all of the above answers are "NO", then may proceed with Cephalosporin use.     Current Outpatient Medications on File Prior to Visit  Medication Sig Dispense Refill   aspirin EC 325 MG tablet Take 1 tablet (325 mg total) by mouth daily. 30 tablet 0   atorvastatin (LIPITOR) 20 MG tablet TAKE 1 TABLET (20 MG TOTAL)  BY MOUTH DAILY AT 6 PM. 90 tablet 1   glipiZIDE (GLUCOTROL) 10 MG tablet TAKE 1 TABLET BY MOUTH 2 TIMES DAILY BEFORE EVENING MEAL 180 tablet 1   glucose blood test strip USED TO CHECK BLOOD SUGAR 3 TIMES DAILY OR PRN 100 each 0   Lancets (ACCU-CHEK SOFT TOUCH) lancets Used to check blood sugar 3 times daily 100 each 12   lisinopril (ZESTRIL) 20 MG tablet TAKE 1 TABLET BY MOUTH EVERY DAY 90 tablet 1   metFORMIN (GLUCOPHAGE) 500 MG tablet TAKE 1/2 TAB IN THE MORNING AND 1 TABLET IN THE EVENING. 135 tablet 1   Multiple Vitamin (MULTIVITAMIN) tablet Take 1 tablet by mouth daily.     naproxen sodium (ALEVE) 220 MG tablet Take 220 mg by mouth 2 (two) times daily as needed (for knee pain.).     OVER THE COUNTER MEDICATION Probiotic extra strength, one capsule daily.     OVER THE COUNTER MEDICATION ClearLax, One capful as needed. May take twice a week.     No current facility-administered medications on file prior to visit.    BP (!) 160/90 (BP Location: Left Arm, Patient Position: Sitting, Cuff Size: Large)    Pulse 64    Temp 98.4 F (36.9 C) (Oral)    Ht 5' 10.25" (1.784 m)    Wt 236 lb 12.8 oz (107.4 kg)    SpO2 98%    BMI 33.74 kg/m       Objective:   Physical Exam Vitals and nursing note reviewed.  Constitutional:      General: He is not in acute distress.    Appearance: Normal appearance. He is well-developed and normal weight.  HENT:     Head: Normocephalic and atraumatic.     Right Ear: Tympanic membrane, ear canal and external ear normal. There is no impacted cerumen.     Left Ear: Tympanic membrane, ear canal and external ear normal. There is no impacted cerumen.     Nose: Nose normal. No congestion or rhinorrhea.     Mouth/Throat:     Mouth: Mucous membranes are moist.     Pharynx: Oropharynx is clear. No oropharyngeal exudate or posterior oropharyngeal erythema.  Eyes:  General:        Right eye: No discharge.        Left eye: No discharge.     Extraocular  Movements: Extraocular movements intact.     Conjunctiva/sclera: Conjunctivae normal.     Pupils: Pupils are equal, round, and reactive to light.  Neck:     Vascular: No carotid bruit.     Trachea: No tracheal deviation.  Cardiovascular:     Rate and Rhythm: Normal rate and regular rhythm.     Pulses: Normal pulses.     Heart sounds: Normal heart sounds. No murmur heard.  No friction rub. No gallop.   Pulmonary:     Effort: Pulmonary effort is normal. No respiratory distress.     Breath sounds: Normal breath sounds. No stridor. No wheezing, rhonchi or rales.  Chest:     Chest wall: No tenderness.  Abdominal:     General: Bowel sounds are normal. There is no distension.     Palpations: Abdomen is soft. There is no mass.     Tenderness: There is no abdominal tenderness. There is no right CVA tenderness, left CVA tenderness, guarding or rebound.     Hernia: No hernia is present.  Musculoskeletal:        General: Tenderness present. No swelling, deformity or signs of injury. Normal range of motion.     Right knee: Normal.     Left knee: Swelling and bony tenderness present. No erythema or crepitus. Normal range of motion. Tenderness present over the MCL and patellar tendon. Normal alignment, normal meniscus and normal patellar mobility.     Instability Tests: Anterior drawer test negative.     Right lower leg: No edema.     Left lower leg: No edema.     Comments: Had pain in the left hip with straight leg raise and knee-to-chest.  No discomfort with internal or external rotation.  When getting off the exam table he became unsteady when he placed weight on his left foot had to be held up to prevent a fall until he can get his footing  Lymphadenopathy:     Cervical: No cervical adenopathy.  Skin:    General: Skin is warm and dry.     Capillary Refill: Capillary refill takes less than 2 seconds.     Coloration: Skin is not jaundiced or pale.     Findings: No bruising, erythema, lesion  or rash.     Comments: Toenail fungus noted to multiple digits bilaterally  Neurological:     General: No focal deficit present.     Mental Status: He is alert and oriented to person, place, and time.     Cranial Nerves: No cranial nerve deficit.     Sensory: No sensory deficit.     Motor: No weakness.     Coordination: Coordination normal.     Gait: Gait normal.     Deep Tendon Reflexes: Reflexes normal.  Psychiatric:        Mood and Affect: Mood normal.        Behavior: Behavior normal.        Thought Content: Thought content normal.        Judgment: Judgment normal.       Assessment & Plan:  1. Routine general medical examination at a health care facility -Follow-up in 1 year or sooner if needed.  Continue to stay active and work on heart healthy diet. - Comprehensive metabolic panel; Future - TSH; Future - Lipid panel;  Future - Hemoglobin A1c; Future - CBC with Differential/Platelet; Future - CBC with Differential/Platelet - Hemoglobin A1c - Lipid panel - TSH - Comprehensive metabolic panel  2. Controlled type 2 diabetes mellitus with other circulatory complication, without long-term current use of insulin ([place]) -Normally well controlled.  Can consider increase in Metformin - glipiZIDE (GLUCOTROL) 10 MG tablet; TAKE 1 TABLET BY MOUTH 2 TIMES DAILY BEFORE EVENING MEAL  Dispense: 180 tablet; Refill: 1 - Lancets (ACCU-CHEK SOFT TOUCH) lancets; Used to check blood sugar 3 times daily  Dispense: 100 each; Refill: 12 - glucose blood test strip; USED TO CHECK BLOOD SUGAR 3 TIMES DAILY OR PRN  Dispense: 100 each; Refill: 0 - metFORMIN (GLUCOPHAGE) 500 MG tablet; TAKE 1/2 TAB IN THE MORNING AND 1 TABLET IN THE EVENING.  Dispense: 135 tablet; Refill: 1 - TSH; Future - Lipid panel; Future - Hemoglobin A1c; Future - CBC with Differential/Platelet; Future - CBC with Differential/Platelet - Hemoglobin A1c - Lipid panel - TSH  3. Mixed hyperlipidemia - Consider increase in  statin  - Comprehensive metabolic panel; Future - TSH; Future - Lipid panel; Future - Hemoglobin A1c; Future - CBC with Differential/Platelet; Future - CBC with Differential/Platelet - Hemoglobin A1c - Lipid panel - TSH - Comprehensive metabolic panel  4. Essential hypertension - well controlled.  - lisinopril (ZESTRIL) 20 MG tablet; Take 1 tablet (20 mg total) by mouth daily.  Dispense: 90 tablet; Refill: 3 - Comprehensive metabolic panel; Future - TSH; Future - Lipid panel; Future - Hemoglobin A1c; Future - CBC with Differential/Platelet; Future - CBC with Differential/Platelet - Hemoglobin A1c - Lipid panel - TSH - Comprehensive metabolic panel  5. Prostate cancer screening  - PSA; Future - PSA  6. Cryptogenic stroke (HCC)  - TSH; Future - Lipid panel; Future - Hemoglobin A1c; Future - CBC with Differential/Platelet; Future - CBC with Differential/Platelet - Hemoglobin A1c - Lipid panel - TSH  7. Acute pain of left knee Start with x-ray but will likely need MRI to rule out soft tissue injury. Consider referral to orthopedics - DG Knee 1-2 Views Left; Future  8. Left hip pain -With x-ray but will likely need MRI to rule out labral tear.  Consider referral to orthopedics - DG Hip Unilat W OR W/O Pelvis 2-3 Views Left; Future  9. Toenail fungus  - terbinafine (LAMISIL) 250 MG tablet; Take 1 tablet (250 mg total) by mouth daily.  Dispense: 30 tablet; Refill: 0  Dorothyann Peng, NP

## 2020-09-23 ENCOUNTER — Other Ambulatory Visit: Payer: Self-pay | Admitting: Adult Health

## 2020-09-23 DIAGNOSIS — E1159 Type 2 diabetes mellitus with other circulatory complications: Secondary | ICD-10-CM

## 2020-09-23 NOTE — Telephone Encounter (Signed)
Sent to the pharmacy by e-scribe. 

## 2020-11-03 ENCOUNTER — Other Ambulatory Visit: Payer: Self-pay

## 2020-11-04 ENCOUNTER — Encounter: Payer: Self-pay | Admitting: Adult Health

## 2020-11-04 ENCOUNTER — Ambulatory Visit: Payer: Medicare PPO | Admitting: Adult Health

## 2020-11-04 VITALS — BP 136/80 | Temp 98.2°F | Wt 241.0 lb

## 2020-11-04 DIAGNOSIS — I10 Essential (primary) hypertension: Secondary | ICD-10-CM | POA: Diagnosis not present

## 2020-11-04 DIAGNOSIS — E1159 Type 2 diabetes mellitus with other circulatory complications: Secondary | ICD-10-CM

## 2020-11-04 LAB — POCT GLYCOSYLATED HEMOGLOBIN (HGB A1C): HbA1c, POC (controlled diabetic range): 7.3 % — AB (ref 0.0–7.0)

## 2020-11-04 NOTE — Progress Notes (Signed)
Subjective:    Patient ID: Trevor Wheeler, male    DOB: 1946/02/15, 75 y.o.   MRN: [id number]  HPI  75 year old male who  has a past medical history of Cataract, CVA (cerebral vascular accident) ([place]) (05/04/2015), Hypertension, and Type II diabetes mellitus ([place]) (dx'd 05/05/2015).  He presents to the office today for 64-month follow-up regarding diabetes and hypertension  Diabetes mellitus-currently maintained on Metformin 250 mg in the morning, 500 mg in the evening, as well as glipizide 10 mg twice daily.  He does occasionally monitor his blood sugars at home and reports readings in the 70s to 100s.  He does try and stay active with walking his dog as well as working for yard [employer].  He was last seen in November 2021 his A1c had increased slightly from 6.9-7.2.  No medications were changed and he was advised to work on cutting back on sugar and carbs Lab Results  Component Value Date   HGBA1C 7.3 (A) 11/04/2020   Essential hypertension-currently prescribed lisinopril 20 mg daily.  He denies chest pain, shortness of breath, dizziness, lightheadedness, or syncopal episodes BP Readings from Last 3 Encounters:  11/04/20 136/80  07/30/20 (!) 160/90  01/11/20 116/80    Review of Systems See HPI   Past Medical History:  Diagnosis Date  . Cataract   . CVA (cerebral vascular accident) ([place]) 05/04/2015   L cerebellar, "only problem I have is w/balance" 05/05/2015)  . Hypertension   . Type II diabetes mellitus ([place]) dx'd 05/05/2015    Social History   Socioeconomic History  . Marital status: Married    Spouse name: Not on file  . Number of children: Not on file  . Years of education: Not on file  . Highest education level: Not on file  Occupational History  . Not on file  Tobacco Use  . Smoking status: Former Smoker    Packs/day: 1.00    Years: 20.00    Pack years: 20.00    Types: Cigarettes    Quit date: 05/21/1974    Years since quitting: 46.4  . Smokeless  tobacco: Never Used  . Tobacco comment: quit smoking in the 1970's/ educated [employer]   Substance and Sexual Activity  . Alcohol use: No    Alcohol/week: 0.0 standard drinks  . Drug use: No  . Sexual activity: Not Currently  Other Topics Concern  . Not on file  Social History Narrative   Employed as a Therapist, music.    Married for 30 years   Has two daughters who live locally.    Social Determinants of Health   Financial Resource Strain: Not on file  Food Insecurity: Not on file  Transportation Needs: Not on file  Physical Activity: Not on file  Stress: Not on file  Social Connections: Not on file  Intimate Partner Violence: Not on file    Past Surgical History:  Procedure Laterality Date  . Cataract Right 2018  . EP IMPLANTABLE DEVICE N/A 05/07/2015   Procedure: Loop Recorder Insertion;  Surgeon: Sanda Klein, MD;  Location: [facility];  Service: Cardiovascular;  Laterality: N/A;  . LOOP RECORDER REMOVAL N/A 09/28/2017   Procedure: LOOP RECORDER REMOVAL;  Surgeon: Sanda Klein, MD;  Location: [facility];  Service: Cardiovascular;  Laterality: N/A;  . NO PAST SURGERIES    . TEE WITHOUT CARDIOVERSION N/A 05/07/2015   Procedure: TRANSESOPHAGEAL ECHOCARDIOGRAM (TEE);  Surgeon: Skeet Latch, MD;  Location: [facility];  Service: Cardiovascular;  Laterality: N/A;    Family History  Problem Relation Age of Onset  . Heart disease Mother   . Colon cancer Brother   . Heart disease Brother   . Esophageal cancer Neg Hx   . Rectal cancer Neg Hx   . Stomach cancer Neg Hx     Allergies  Allergen Reactions  . Penicillins Hives    Has patient had a PCN reaction causing immediate rash, facial/tongue/throat swelling, SOB or lightheadedness with hypotension: No Has patient had a PCN reaction causing severe rash involving mucus membranes or skin necrosis: No Has patient had a PCN reaction that required hospitalization: No Has patient had a PCN reaction  occurring within the last 10 years: No If all of the above answers are "NO", then may proceed with Cephalosporin use.     Current Outpatient Medications on File Prior to Visit  Medication Sig Dispense Refill  . ACCU-CHEK GUIDE test strip USE TO CHECK BLOOD SUGAR 3 TIMES A DAY OR AS NEEDED 100 strip 3  . aspirin EC 325 MG tablet Take 1 tablet (325 mg total) by mouth daily. 30 tablet 0  . atorvastatin (LIPITOR) 20 MG tablet Take 1 tablet (20 mg total) by mouth daily at 6 PM. 90 tablet 3  . glipiZIDE (GLUCOTROL) 10 MG tablet TAKE 1 TABLET BY MOUTH 2 TIMES DAILY BEFORE EVENING MEAL 180 tablet 1  . Lancets (ACCU-CHEK SOFT TOUCH) lancets Used to check blood sugar 3 times daily 100 each 12  . lisinopril (ZESTRIL) 20 MG tablet Take 1 tablet (20 mg total) by mouth daily. 90 tablet 3  . metFORMIN (GLUCOPHAGE) 500 MG tablet TAKE 1/2 TAB IN THE MORNING AND 1 TABLET IN THE EVENING. 135 tablet 1  . Multiple Vitamin (MULTIVITAMIN) tablet Take 1 tablet by mouth daily.    . naproxen sodium (ALEVE) 220 MG tablet Take 220 mg by mouth 2 (two) times daily as needed (for knee pain.).    Marland Kitchen OVER THE COUNTER MEDICATION Probiotic extra strength, one capsule daily.    Marland Kitchen OVER THE COUNTER MEDICATION ClearLax, One capful as needed. May take twice a week.    . terbinafine (LAMISIL) 250 MG tablet Take 1 tablet (250 mg total) by mouth daily. 30 tablet 0   No current facility-administered medications on file prior to visit.    BP 136/80   Temp 98.2 F (36.8 C)   Wt 241 lb (109.3 kg)   BMI 34.33 kg/m       Objective:   Physical Exam Vitals and nursing note reviewed.  Constitutional:      Appearance: Normal appearance. He is obese.  Cardiovascular:     Rate and Rhythm: Normal rate and regular rhythm.     Pulses: Normal pulses.     Heart sounds: Normal heart sounds.  Pulmonary:     Effort: Pulmonary effort is normal.     Breath sounds: Normal breath sounds.  Musculoskeletal:        General: Normal range of  motion.  Skin:    General: Skin is warm and dry.  Neurological:     General: No focal deficit present.     Mental Status: He is alert and oriented to person, place, and time.  Psychiatric:        Mood and Affect: Mood normal.        Behavior: Behavior normal.        Thought Content: Thought content normal.        Judgment: Judgment normal.  Assessment & Plan:  1. Controlled type 2 diabetes mellitus with other circulatory complication, without long-term current use of insulin ([facility]) - POCT A1C- 7.3- has increased and not at goal  - Will have him increase his Metoprolol to 500 mg BID  - Continue with glipizide  - Follow up in 3 months   2. Essential hypertension - No change in medications    Dorothyann Peng, NP

## 2020-11-04 NOTE — Patient Instructions (Signed)
Your A1c went to 7.3   Increase metformin to 1 tab twice a day   Lets follow up in 3 months

## 2020-11-07 LAB — HM DIABETES EYE EXAM

## 2020-11-11 ENCOUNTER — Encounter: Payer: Self-pay | Admitting: Family Medicine

## 2021-01-20 ENCOUNTER — Other Ambulatory Visit: Payer: Self-pay | Admitting: Adult Health

## 2021-01-20 DIAGNOSIS — E1159 Type 2 diabetes mellitus with other circulatory complications: Secondary | ICD-10-CM

## 2021-01-21 ENCOUNTER — Telehealth (INDEPENDENT_AMBULATORY_CARE_PROVIDER_SITE_OTHER): Payer: Medicare PPO | Admitting: Adult Health

## 2021-01-21 ENCOUNTER — Encounter: Payer: Self-pay | Admitting: Adult Health

## 2021-01-21 VITALS — Temp 98.1°F

## 2021-01-21 DIAGNOSIS — J069 Acute upper respiratory infection, unspecified: Secondary | ICD-10-CM

## 2021-01-21 MED ORDER — DOXYCYCLINE HYCLATE 100 MG PO CAPS: 100.0000 mg | ORAL_CAPSULE | Freq: Two times a day (BID) | ORAL | 0 refills | Status: DC

## 2021-01-21 MED ORDER — BENZONATATE 200 MG PO CAPS
200.0000 mg | ORAL_CAPSULE | Freq: Three times a day (TID) | ORAL | 1 refills | Status: DC | PRN
Start: 2021-01-21 — End: 2021-02-03

## 2021-01-21 MED ORDER — PREDNISONE 20 MG PO TABS: 20.0000 mg | ORAL_TABLET | Freq: Every day | ORAL | 0 refills | Status: DC

## 2021-01-21 NOTE — Progress Notes (Signed)
Virtual Visit via Video Note  I connected with Trevor Wheeler on 01/21/21 at  4:00 PM EDT by a video enabled telemedicine application and verified that I am speaking with the correct person using two identifiers.  Location patient: home Location provider:work or home office Persons participating in the virtual visit: patient, provider  I discussed the limitations of evaluation and management by telemedicine and the availability of in person appointments. The patient expressed understanding and agreed to proceed.   HPI: 75 year old male who is being evaluated today for an acute issue.  His symptoms started 3 weeks ago.  Symptoms include a semi productive cough, headaches, nasal congestion.  He has taken home COVID test which was negative.  He is fully vaccinated against COVID-19.  Has been using Mucinex and cough drops without relief.  Has not experienced any fevers or chills   ROS: See pertinent positives and negatives per HPI.  Past Medical History:  Diagnosis Date  . Cataract   . CVA (cerebral vascular accident) ([place]) 05/04/2015   L cerebellar, "only problem I have is w/balance" 05/05/2015)  . Hypertension   . Type II diabetes mellitus ([place]) dx'd 05/05/2015    Past Surgical History:  Procedure Laterality Date  . Cataract Right 2018  . EP IMPLANTABLE DEVICE N/A 05/07/2015   Procedure: Loop Recorder Insertion;  Surgeon: Sanda Klein, MD;  Location: [facility];  Service: Cardiovascular;  Laterality: N/A;  . LOOP RECORDER REMOVAL N/A 09/28/2017   Procedure: LOOP RECORDER REMOVAL;  Surgeon: Sanda Klein, MD;  Location: [facility];  Service: Cardiovascular;  Laterality: N/A;  . NO PAST SURGERIES    . TEE WITHOUT CARDIOVERSION N/A 05/07/2015   Procedure: TRANSESOPHAGEAL ECHOCARDIOGRAM (TEE);  Surgeon: Skeet Latch, MD;  Location: [facility];  Service: Cardiovascular;  Laterality: N/A;    Family History  Problem Relation Age of Onset  . Heart disease Mother   .  Colon cancer Brother   . Heart disease Brother   . Esophageal cancer Neg Hx   . Rectal cancer Neg Hx   . Stomach cancer Neg Hx        Current Outpatient Medications:  .  ACCU-CHEK GUIDE test strip, USE TO CHECK BLOOD SUGAR 3 TIMES A DAY OR AS NEEDED, Disp: 100 strip, Rfl: 3 .  aspirin EC 325 MG tablet, Take 1 tablet (325 mg total) by mouth daily., Disp: 30 tablet, Rfl: 0 .  atorvastatin (LIPITOR) 20 MG tablet, Take 1 tablet (20 mg total) by mouth daily at 6 PM., Disp: 90 tablet, Rfl: 3 .  benzonatate (TESSALON) 200 MG capsule, Take 1 capsule (200 mg total) by mouth 3 (three) times daily as needed for cough., Disp: 30 capsule, Rfl: 1 .  doxycycline (VIBRAMYCIN) 100 MG capsule, Take 1 capsule (100 mg total) by mouth 2 (two) times daily., Disp: 14 capsule, Rfl: 0 .  glipiZIDE (GLUCOTROL) 10 MG tablet, TAKE 1 TABLET BY MOUTH 2 TIMES DAILY BEFORE EVENING MEAL, Disp: 180 tablet, Rfl: 1 .  Lancets (ACCU-CHEK SOFT TOUCH) lancets, Used to check blood sugar 3 times daily, Disp: 100 each, Rfl: 12 .  lisinopril (ZESTRIL) 20 MG tablet, Take 1 tablet (20 mg total) by mouth daily., Disp: 90 tablet, Rfl: 3 .  metFORMIN (GLUCOPHAGE) 500 MG tablet, TAKE 1/2 TAB IN THE MORNING AND 1 TABLET IN THE EVENING. (Patient taking differently: Take 500 mg by mouth 2 (two) times daily with a meal.), Disp: 135 tablet, Rfl: 1 .  Multiple Vitamin (MULTIVITAMIN) tablet, Take  1 tablet by mouth daily., Disp: , Rfl:  .  naproxen sodium (ALEVE) 220 MG tablet, Take 220 mg by mouth 2 (two) times daily as needed (for knee pain.)., Disp: , Rfl:  .  OVER THE COUNTER MEDICATION, Probiotic extra strength, one capsule daily., Disp: , Rfl:  .  OVER THE COUNTER MEDICATION, ClearLax, One capful as needed. May take twice a week., Disp: , Rfl:  .  predniSONE (DELTASONE) 20 MG tablet, Take 1 tablet (20 mg total) by mouth daily with breakfast., Disp: 7 tablet, Rfl: 0  EXAM:  VITALS per patient if applicable:  GENERAL: alert, oriented,  appears well and in no acute distress  HEENT: atraumatic, conjunttiva clear, no obvious abnormalities on inspection of external nose and ears  NECK: normal movements of the head and neck  LUNGS: on inspection no signs of respiratory distress, breathing rate appears normal, no obvious gross SOB, gasping or wheezing  CV: no obvious cyanosis  MS: moves all visible extremities without noticeable abnormality  PSYCH/NEURO: pleasant and cooperative, no obvious depression or anxiety, speech and thought processing grossly intact  ASSESSMENT AND PLAN:  Discussed the following assessment and plan:  - Will treat due to duration of symptoms   Upper respiratory tract infection, unspecified type - Plan: doxycycline (VIBRAMYCIN) 100 MG capsule, predniSONE (DELTASONE) 20 MG tablet, benzonatate (TESSALON) 200 MG capsule  - Follow up if no improvement in the next 3-4 days      I discussed the assessment and treatment plan with the patient. The patient was provided an opportunity to ask questions and all were answered. The patient agreed with the plan and demonstrated an understanding of the instructions.   The patient was advised to call back or seek an in-person evaluation if the symptoms worsen or if the condition fails to improve as anticipated.   Dorothyann Peng, NP

## 2021-02-03 ENCOUNTER — Ambulatory Visit: Payer: Medicare PPO | Admitting: Adult Health

## 2021-02-03 ENCOUNTER — Other Ambulatory Visit: Payer: Self-pay

## 2021-02-03 ENCOUNTER — Encounter: Payer: Self-pay | Admitting: Adult Health

## 2021-02-03 VITALS — BP 124/80 | Temp 98.3°F | Ht 70.25 in | Wt 233.1 lb

## 2021-02-03 DIAGNOSIS — I10 Essential (primary) hypertension: Secondary | ICD-10-CM

## 2021-02-03 DIAGNOSIS — E1159 Type 2 diabetes mellitus with other circulatory complications: Secondary | ICD-10-CM | POA: Diagnosis not present

## 2021-02-03 LAB — POCT GLYCOSYLATED HEMOGLOBIN (HGB A1C): Hemoglobin A1C: 7.1 % — AB (ref 4.0–5.6)

## 2021-02-03 MED ORDER — METFORMIN HCL 500 MG PO TABS: 500.0000 mg | ORAL_TABLET | Freq: Two times a day (BID) | ORAL | 1 refills | Status: DC

## 2021-02-03 NOTE — Progress Notes (Signed)
Subjective:    Patient ID: Trevor Wheeler, male    DOB: 05-29-1946, 75 y.o.   MRN: [id number]  HPI 75 year old male who   has a past medical history of Cataract, CVA (cerebral vascular accident) ([place]) (05/04/2015), Hypertension, and Type II diabetes mellitus ([place]) (dx'd 05/05/2015).  He presents to the office today for follow-up regarding diabetes mellitus and HTN.  He was last seen back in February 2022, at which time he was on 250 mg of metformin in the morning and 500 mg in the evening and glipizide 10 mg BID .  His A1c had increased to 7.3.  Subsequently metformin was increased to 500 mg twice daily and he was advised to work on lifestyle modification. He has been more active with cutting yards and has been working on diet.  Lab Results  Component Value Date   HGBA1C 7.1 (A) 02/03/2021   Wt Readings from Last 3 Encounters:  02/03/21 233 lb 2 oz (105.7 kg)  11/04/20 241 lb (109.3 kg)  07/30/20 236 lb 12.8 oz (107.4 kg)    Hypertension-currently prescribed lisinopril 20 mg.  He denies chest pain, shortness of breath, dizziness, lightheadedness or syncopal episodes.  BP Readings from Last 3 Encounters:  02/03/21 124/80  11/04/20 136/80  07/30/20 (!) 160/90    Review of Systems See HPI   Past Medical History:  Diagnosis Date  . Cataract   . CVA (cerebral vascular accident) ([place]) 05/04/2015   L cerebellar, "only problem I have is w/balance" 05/05/2015)  . Hypertension   . Type II diabetes mellitus ([place]) dx'd 05/05/2015    Social History   Socioeconomic History  . Marital status: Married    Spouse name: Not on file  . Number of children: Not on file  . Years of education: Not on file  . Highest education level: Not on file  Occupational History  . Not on file  Tobacco Use  . Smoking status: Former Smoker    Packs/day: 1.00    Years: 20.00    Pack years: 20.00    Types: Cigarettes    Quit date: 05/21/1974    Years since quitting: 46.7  . Smokeless tobacco: Never Used   . Tobacco comment: quit smoking in the 1970's/ educated [employer]   Substance and Sexual Activity  . Alcohol use: No    Alcohol/week: 0.0 standard drinks  . Drug use: No  . Sexual activity: Not Currently  Other Topics Concern  . Not on file  Social History Narrative   Employed as a Therapist, music.    Married for 30 years   Has two daughters who live locally.    Social Determinants of Health   Financial Resource Strain: Not on file  Food Insecurity: Not on file  Transportation Needs: Not on file  Physical Activity: Not on file  Stress: Not on file  Social Connections: Not on file  Intimate Partner Violence: Not on file    Past Surgical History:  Procedure Laterality Date  . Cataract Right 2018  . EP IMPLANTABLE DEVICE N/A 05/07/2015   Procedure: Loop Recorder Insertion;  Surgeon: Sanda Klein, MD;  Location: [facility];  Service: Cardiovascular;  Laterality: N/A;  . LOOP RECORDER REMOVAL N/A 09/28/2017   Procedure: LOOP RECORDER REMOVAL;  Surgeon: Sanda Klein, MD;  Location: [facility];  Service: Cardiovascular;  Laterality: N/A;  . NO PAST SURGERIES    . TEE WITHOUT CARDIOVERSION N/A 05/07/2015   Procedure: TRANSESOPHAGEAL ECHOCARDIOGRAM (TEE);  Surgeon: Skeet Latch, MD;  Location: [facility] ENDOSCOPY;  Service: Cardiovascular;  Laterality: N/A;    Family History  Problem Relation Age of Onset  . Heart disease Mother   . Colon cancer Brother   . Heart disease Brother   . Esophageal cancer Neg Hx   . Rectal cancer Neg Hx   . Stomach cancer Neg Hx     Allergies  Allergen Reactions  . Penicillins Hives    Has patient had a PCN reaction causing immediate rash, facial/tongue/throat swelling, SOB or lightheadedness with hypotension: No Has patient had a PCN reaction causing severe rash involving mucus membranes or skin necrosis: No Has patient had a PCN reaction that required hospitalization: No Has patient had a PCN reaction occurring within the last  10 years: No If all of the above answers are "NO", then may proceed with Cephalosporin use.     Current Outpatient Medications on File Prior to Visit  Medication Sig Dispense Refill  . ACCU-CHEK GUIDE test strip USE TO CHECK BLOOD SUGAR 3 TIMES A DAY OR AS NEEDED 100 strip 3  . aspirin EC 325 MG tablet Take 1 tablet (325 mg total) by mouth daily. 30 tablet 0  . atorvastatin (LIPITOR) 20 MG tablet Take 1 tablet (20 mg total) by mouth daily at 6 PM. 90 tablet 3  . glipiZIDE (GLUCOTROL) 10 MG tablet TAKE 1 TABLET BY MOUTH 2 TIMES DAILY BEFORE EVENING MEAL 180 tablet 1  . Lancets (ACCU-CHEK SOFT TOUCH) lancets Used to check blood sugar 3 times daily 100 each 12  . lisinopril (ZESTRIL) 20 MG tablet Take 1 tablet (20 mg total) by mouth daily. 90 tablet 3  . Multiple Vitamin (MULTIVITAMIN) tablet Take 1 tablet by mouth daily.    . naproxen sodium (ALEVE) 220 MG tablet Take 220 mg by mouth 2 (two) times daily as needed (for knee pain.).    Marland Kitchen OVER THE COUNTER MEDICATION Probiotic extra strength, one capsule daily.    Marland Kitchen OVER THE COUNTER MEDICATION ClearLax, One capful as needed. May take twice a week.     No current facility-administered medications on file prior to visit.    BP 124/80   Temp 98.3 F (36.8 C) (Oral)   Ht 5' 10.25" (1.784 m)   Wt 233 lb 2 oz (105.7 kg)   BMI 33.21 kg/m       Objective:   Physical Exam Vitals and nursing note reviewed.  Constitutional:      Appearance: Normal appearance.  Cardiovascular:     Rate and Rhythm: Normal rate and regular rhythm.     Pulses: Normal pulses.     Heart sounds: Normal heart sounds.  Pulmonary:     Effort: Pulmonary effort is normal.     Breath sounds: Normal breath sounds.  Musculoskeletal:        General: Normal range of motion.  Skin:    General: Skin is warm and dry.     Capillary Refill: Capillary refill takes less than 2 seconds.  Neurological:     General: No focal deficit present.     Mental Status: He is alert and  oriented to person, place, and time.  Psychiatric:        Mood and Affect: Mood normal.        Behavior: Behavior normal.        Thought Content: Thought content normal.        Judgment: Judgment normal.       Assessment & Plan:  1.  Controlled type 2 diabetes mellitus with other circulatory complication, without long-term current use of insulin (HCC)  - POC HgB A1c- 7.1 - improving. Continue with current treatment. Follow up in 6 months for CPE  - metFORMIN (GLUCOPHAGE) 500 MG tablet; Take 1 tablet (500 mg total) by mouth 2 (two) times daily with a meal.  Dispense: 180 tablet; Refill: 1  2. Essential hypertension - Well controlled.  - No changes in medications  Dorothyann Peng, NP

## 2021-02-05 ENCOUNTER — Other Ambulatory Visit: Payer: Self-pay | Admitting: Adult Health

## 2021-02-05 DIAGNOSIS — E1159 Type 2 diabetes mellitus with other circulatory complications: Secondary | ICD-10-CM

## 2021-02-26 ENCOUNTER — Other Ambulatory Visit: Payer: Self-pay

## 2021-02-26 ENCOUNTER — Ambulatory Visit (INDEPENDENT_AMBULATORY_CARE_PROVIDER_SITE_OTHER): Payer: Medicare PPO

## 2021-02-26 DIAGNOSIS — Z0000 Encounter for general adult medical examination without abnormal findings: Secondary | ICD-10-CM | POA: Diagnosis not present

## 2021-02-26 NOTE — Patient Instructions (Signed)
Trevor Wheeler , Thank you for taking time to come for your Medicare Wellness Visit. I appreciate your ongoing commitment to your health goals. Please review the following plan we discussed and let me know if I can assist you in the future.   Screening recommendations/referrals: Colonoscopy: current 12/05/2018 Recommended yearly ophthalmology/optometry visit for glaucoma screening and checkup Recommended yearly dental visit for hygiene and checkup  Vaccinations: Influenza vaccine: due in fall 2022  Pneumococcal vaccine: completed series  Tdap vaccine: current 10/14/2020 Shingles vaccine:  pt declined   Advanced directives: will provide copies   Conditions/risks identified: none   Next appointment: CPE 07/31/2021 700am with Dorothyann Peng   Preventive Care 74 Years and Older, Male Preventive care refers to lifestyle choices and visits with your health care provider that can promote health and wellness. What does preventive care include? A yearly physical exam. This is also called an annual well check. Dental exams once or twice a year. Routine eye exams. Ask your health care provider how often you should have your eyes checked. Personal lifestyle choices, including: Daily care of your teeth and gums. Regular physical activity. Eating a healthy diet. Avoiding tobacco and drug use. Limiting alcohol use. Practicing safe sex. Taking low doses of aspirin every day. Taking vitamin and mineral supplements as recommended by your health care provider. What happens during an annual well check? The services and screenings done by your health care provider during your annual well check will depend on your age, overall health, lifestyle risk factors, and family history of disease. Counseling  Your health care provider may ask you questions about your: Alcohol use. Tobacco use. Drug use. Emotional well-being. Home and relationship well-being. Sexual activity. Eating habits. History of  falls. Memory and ability to understand (cognition). Work and work Statistician. Screening  You may have the following tests or measurements: Height, weight, and BMI. Blood pressure. Lipid and cholesterol levels. These may be checked every 5 years, or more frequently if you are over 74 years old. Skin check. Lung cancer screening. You may have this screening every year starting at age 74 if you have a 30-pack-year history of smoking and currently smoke or have quit within the past 15 years. Fecal occult blood test (FOBT) of the stool. You may have this test every year starting at age 74. Flexible sigmoidoscopy or colonoscopy. You may have a sigmoidoscopy every 5 years or a colonoscopy every 10 years starting at age 74. Prostate cancer screening. Recommendations will vary depending on your family history and other risks. Hepatitis C blood test. Hepatitis B blood test. Sexually transmitted disease (STD) testing. Diabetes screening. This is done by checking your blood sugar (glucose) after you have not eaten for a while (fasting). You may have this done every 1-3 years. Abdominal aortic aneurysm (AAA) screening. You may need this if you are a current or former smoker. Osteoporosis. You may be screened starting at age 74 if you are at high risk. Talk with your health care provider about your test results, treatment options, and if necessary, the need for more tests. Vaccines  Your health care provider may recommend certain vaccines, such as: Influenza vaccine. This is recommended every year. Tetanus, diphtheria, and acellular pertussis (Tdap, Td) vaccine. You may need a Td booster every 10 years. Zoster vaccine. You may need this after age 74. Pneumococcal 13-valent conjugate (PCV13) vaccine. One dose is recommended after age 74. Pneumococcal polysaccharide (PPSV23) vaccine. One dose is recommended after age 74. Talk to your health care  provider about which screenings and vaccines you need and  how often you need them. This information is not intended to replace advice given to you by your health care provider. Make sure you discuss any questions you have with your health care provider. Document Released: 10/03/2015 Document Revised: 05/26/2016 Document Reviewed: 07/08/2015 Elsevier Interactive Patient Education  2017 [place] Prevention in the Home Falls can cause injuries. They can happen to people of all ages. There are many things you can do to make your home safe and to help prevent falls. What can I do on the outside of my home? Regularly fix the edges of walkways and driveways and fix any cracks. Remove anything that might make you trip as you walk through a door, such as a raised step or threshold. Trim any bushes or trees on the path to your home. Use bright outdoor lighting. Clear any walking paths of anything that might make someone trip, such as rocks or tools. Regularly check to see if handrails are loose or broken. Make sure that both sides of any steps have handrails. Any raised decks and porches should have guardrails on the edges. Have any leaves, snow, or ice cleared regularly. Use sand or salt on walking paths during winter. Clean up any spills in your garage right away. This includes oil or grease spills. What can I do in the bathroom? Use night lights. Install grab bars by the toilet and in the tub and shower. Do not use towel bars as grab bars. Use non-skid mats or decals in the tub or shower. If you need to sit down in the shower, use a plastic, non-slip stool. Keep the floor dry. Clean up any water that spills on the floor as soon as it happens. Remove soap buildup in the tub or shower regularly. Attach bath mats securely with double-sided non-slip rug tape. Do not have throw rugs and other things on the floor that can make you trip. What can I do in the bedroom? Use night lights. Make sure that you have a light by your bed that is easy to  reach. Do not use any sheets or blankets that are too big for your bed. They should not hang down onto the floor. Have a firm chair that has side arms. You can use this for support while you get dressed. Do not have throw rugs and other things on the floor that can make you trip. What can I do in the kitchen? Clean up any spills right away. Avoid walking on wet floors. Keep items that you use a lot in easy-to-reach places. If you need to reach something above you, use a strong step stool that has a grab bar. Keep electrical cords out of the way. Do not use floor polish or wax that makes floors slippery. If you must use wax, use non-skid floor wax. Do not have throw rugs and other things on the floor that can make you trip. What can I do with my stairs? Do not leave any items on the stairs. Make sure that there are handrails on both sides of the stairs and use them. Fix handrails that are broken or loose. Make sure that handrails are as long as the stairways. Check any carpeting to make sure that it is firmly attached to the stairs. Fix any carpet that is loose or worn. Avoid having throw rugs at the top or bottom of the stairs. If you do have throw rugs, attach them to the  floor with carpet tape. Make sure that you have a light switch at the top of the stairs and the bottom of the stairs. If you do not have them, ask someone to add them for you. What else can I do to help prevent falls? Wear shoes that: Do not have high heels. Have rubber bottoms. Are comfortable and fit you well. Are closed at the toe. Do not wear sandals. If you use a stepladder: Make sure that it is fully opened. Do not climb a closed stepladder. Make sure that both sides of the stepladder are locked into place. Ask someone to hold it for you, if possible. Clearly mark and make sure that you can see: Any grab bars or handrails. First and last steps. Where the edge of each step is. Use tools that help you move  around (mobility aids) if they are needed. These include: Canes. Walkers. Scooters. Crutches. Turn on the lights when you go into a dark area. Replace any light bulbs as soon as they burn out. Set up your furniture so you have a clear path. Avoid moving your furniture around. If any of your floors are uneven, fix them. If there are any pets around you, be aware of where they are. Review your medicines with your doctor. Some medicines can make you feel dizzy. This can increase your chance of falling. Ask your doctor what other things that you can do to help prevent falls. This information is not intended to replace advice given to you by your health care provider. Make sure you discuss any questions you have with your health care provider. Document Released: 07/03/2009 Document Revised: 02/12/2016 Document Reviewed: 10/11/2014 Elsevier Interactive Patient Education  2017 [employer].

## 2021-02-26 NOTE — Progress Notes (Deleted)
New Patient Office Visit  Subjective:  Patient ID: Trevor Wheeler, male    DOB: 06-16-1946  Age: 75 y.o. MRN: [id number]  CC: No chief complaint on file.   HPI Trevor Wheeler presents for ***  Past Medical History:  Diagnosis Date   Cataract    CVA (cerebral vascular accident) ([place]) 05/04/2015   L cerebellar, "only problem I have is w/balance" 05/05/2015)   Hypertension    Type II diabetes mellitus ([place]) dx'd 05/05/2015    Past Surgical History:  Procedure Laterality Date   Cataract Right 2018   EP IMPLANTABLE DEVICE N/A 05/07/2015   Procedure: Loop Recorder Insertion;  Surgeon: Sanda Klein, MD;  Location: [facility];  Service: Cardiovascular;  Laterality: N/A;   LOOP RECORDER REMOVAL N/A 09/28/2017   Procedure: LOOP RECORDER REMOVAL;  Surgeon: Sanda Klein, MD;  Location: [facility];  Service: Cardiovascular;  Laterality: N/A;   NO PAST SURGERIES     TEE WITHOUT CARDIOVERSION N/A 05/07/2015   Procedure: TRANSESOPHAGEAL ECHOCARDIOGRAM (TEE);  Surgeon: Skeet Latch, MD;  Location: [facility] ENDOSCOPY;  Service: Cardiovascular;  Laterality: N/A;    Family History  Problem Relation Age of Onset   Heart disease Mother    Colon cancer Brother    Heart disease Brother    Esophageal cancer Neg Hx    Rectal cancer Neg Hx    Stomach cancer Neg Hx     Social History   Socioeconomic History   Marital status: Married    Spouse name: Not on file   Number of children: Not on file   Years of education: Not on file   Highest education level: Not on file  Occupational History   Not on file  Tobacco Use   Smoking status: Former    Packs/day: 1.00    Years: 20.00    Pack years: 20.00    Types: Cigarettes    Quit date: 05/21/1974    Years since quitting: 46.8   Smokeless tobacco: Never   Tobacco comments:    quit smoking in the 1970's/ educated [employer]   Substance and Sexual Activity   Alcohol use: No    Alcohol/week: 0.0 standard drinks   Drug use: No   Sexual  activity: Not Currently  Other Topics Concern   Not on file  Social History Narrative   Employed as a Therapist, music.    Married for 30 years   Has two daughters who live locally.    Social Determinants of Health   Financial Resource Strain: Not on file  Food Insecurity: Not on file  Transportation Needs: Not on file  Physical Activity: Not on file  Stress: Not on file  Social Connections: Not on file  Intimate Partner Violence: Not on file    ROS Review of Systems  Objective:   Today's Vitals: There were no vitals taken for this visit.  Physical Exam  Assessment & Plan:   Problem List Items Addressed This Visit   None   Outpatient Encounter Medications as of 02/26/2021  Medication Sig   ACCU-CHEK GUIDE test strip USE TO CHECK BLOOD SUGAR 3 TIMES A DAY OR AS NEEDED   aspirin EC 325 MG tablet Take 1 tablet (325 mg total) by mouth daily.   atorvastatin (LIPITOR) 20 MG tablet Take 1 tablet (20 mg total) by mouth daily at 6 PM.   glipiZIDE (GLUCOTROL) 10 MG tablet TAKE 1 TABLET BY MOUTH 2 TIMES DAILY BEFORE EVENING MEAL   Lancets (ACCU-CHEK  SOFT TOUCH) lancets Used to check blood sugar 3 times daily   lisinopril (ZESTRIL) 20 MG tablet Take 1 tablet (20 mg total) by mouth daily.   metFORMIN (GLUCOPHAGE) 500 MG tablet Take 1 tablet (500 mg total) by mouth 2 (two) times daily with a meal.   Multiple Vitamin (MULTIVITAMIN) tablet Take 1 tablet by mouth daily.   naproxen sodium (ALEVE) 220 MG tablet Take 220 mg by mouth 2 (two) times daily as needed (for knee pain.).   OVER THE COUNTER MEDICATION Probiotic extra strength, one capsule daily.   OVER THE COUNTER MEDICATION ClearLax, One capful as needed. May take twice a week.   No facility-administered encounter medications on file as of 02/26/2021.    Follow-up: No follow-ups on file.   Randel Pigg, LPN

## 2021-02-26 NOTE — Progress Notes (Deleted)
Subjective:   Trevor Wheeler is a 75 y.o. male who presents for an Initial Medicare Annual Wellness Visit.  Review of Systems  n/a       Objective:    There were no vitals filed for this visit. There is no height or weight on file to calculate BMI.  Advanced Directives 07/11/2018 09/28/2017 07/08/2017 05/23/2015 05/05/2015  Does Patient Have a Medical Advance Directive? No No No No No  Would patient like information on creating a medical advance directive? - No - Patient declined - Yes - Educational materials given -    Current Medications (verified) Outpatient Encounter Medications as of 02/26/2021  Medication Sig   ACCU-CHEK GUIDE test strip USE TO CHECK BLOOD SUGAR 3 TIMES A DAY OR AS NEEDED   aspirin EC 325 MG tablet Take 1 tablet (325 mg total) by mouth daily.   atorvastatin (LIPITOR) 20 MG tablet Take 1 tablet (20 mg total) by mouth daily at 6 PM.   glipiZIDE (GLUCOTROL) 10 MG tablet TAKE 1 TABLET BY MOUTH 2 TIMES DAILY BEFORE EVENING MEAL   Lancets (ACCU-CHEK SOFT TOUCH) lancets Used to check blood sugar 3 times daily   lisinopril (ZESTRIL) 20 MG tablet Take 1 tablet (20 mg total) by mouth daily.   metFORMIN (GLUCOPHAGE) 500 MG tablet Take 1 tablet (500 mg total) by mouth 2 (two) times daily with a meal.   Multiple Vitamin (MULTIVITAMIN) tablet Take 1 tablet by mouth daily.   naproxen sodium (ALEVE) 220 MG tablet Take 220 mg by mouth 2 (two) times daily as needed (for knee pain.).   OVER THE COUNTER MEDICATION Probiotic extra strength, one capsule daily.   OVER THE COUNTER MEDICATION ClearLax, One capful as needed. May take twice a week.   No facility-administered encounter medications on file as of 02/26/2021.    Allergies (verified) Penicillins   History: Past Medical History:  Diagnosis Date   Cataract    CVA (cerebral vascular accident) ([place]) 05/04/2015   L cerebellar, "only problem I have is w/balance" 05/05/2015)   Hypertension    Type II diabetes mellitus ([place])  dx'd 05/05/2015   Past Surgical History:  Procedure Laterality Date   Cataract Right 2018   EP IMPLANTABLE DEVICE N/A 05/07/2015   Procedure: Loop Recorder Insertion;  Surgeon: Sanda Klein, MD;  Location: [facility];  Service: Cardiovascular;  Laterality: N/A;   LOOP RECORDER REMOVAL N/A 09/28/2017   Procedure: LOOP RECORDER REMOVAL;  Surgeon: Sanda Klein, MD;  Location: [facility];  Service: Cardiovascular;  Laterality: N/A;   NO PAST SURGERIES     TEE WITHOUT CARDIOVERSION N/A 05/07/2015   Procedure: TRANSESOPHAGEAL ECHOCARDIOGRAM (TEE);  Surgeon: Skeet Latch, MD;  Location: [facility] ENDOSCOPY;  Service: Cardiovascular;  Laterality: N/A;   Family History  Problem Relation Age of Onset   Heart disease Mother    Colon cancer Brother    Heart disease Brother    Esophageal cancer Neg Hx    Rectal cancer Neg Hx    Stomach cancer Neg Hx    Social History   Socioeconomic History   Marital status: Married    Spouse name: Not on file   Number of children: Not on file   Years of education: Not on file   Highest education level: Not on file  Occupational History   Not on file  Tobacco Use   Smoking status: Former    Packs/day: 1.00    Years: 20.00    Pack years: 20.00  Types: Cigarettes    Quit date: 05/21/1974    Years since quitting: 46.8   Smokeless tobacco: Never   Tobacco comments:    quit smoking in the 1970's/ educated [employer]   Substance and Sexual Activity   Alcohol use: No    Alcohol/week: 0.0 standard drinks   Drug use: No   Sexual activity: Not Currently  Other Topics Concern   Not on file  Social History Narrative   Employed as a Therapist, music.    Married for 30 years   Has two daughters who live locally.    Social Determinants of Health   Financial Resource Strain: Not on file  Food Insecurity: Not on file  Transportation Needs: Not on file  Physical Activity: Not on file  Stress: Not on file  Social Connections: Not on file     Tobacco Counseling Counseling given: Not Answered Tobacco comments: quit smoking in the 1970's/ educated [employer]    Clinical Intake:                 Diabetic?yes Nutrition Risk Assessment:  Has the patient had any N/V/D within the last 2 months?  {YES/NO:21197} Does the patient have any non-healing wounds?  {YES/NO:21197} Has the patient had any unintentional weight loss or weight gain?  {YES/NO:21197}  Diabetes:  Is the patient diabetic?  {YES/NO:21197} If diabetic, was a CBG obtained today?  {YES/NO:21197} Did the patient bring in their glucometer from home?  {YES/NO:21197} How often do you monitor your CBG's? ***.   Financial Strains and Diabetes Management:  Are you having any financial strains with the device, your supplies or your medication? {YES/NO:21197}.  Does the patient want to be seen by Chronic Care Management for management of their diabetes?  {YES/NO:21197} Would the patient like to be referred to a Nutritionist or for Diabetic Management?  {YES/NO:21197}  Diabetic Exams:  {Diabetic Eye Exam:2101801} {Diabetic Foot Exam:2101802}          Activities of Daily Living No flowsheet data found.  Patient Care Team: Dorothyann Peng, NP as PCP - General (Family Medicine) Croitoru, Dani Gobble, MD as Consulting Physician (Cardiology)  Indicate any recent Medical Services you may have received from other than [facility] providers in the past year (date may be approximate).     Assessment:   This is a routine wellness examination for Trevor Wheeler.  Hearing/Vision screen No results found.  Dietary issues and exercise activities discussed:     Goals Addressed   None    Depression Screen PHQ 2/9 Scores 07/30/2020 07/11/2018 07/04/2018 07/08/2017 06/28/2017 06/19/2015  PHQ - 2 Score 0 0 0 0 0 0  PHQ- 9 Score 0 - - - - -    Fall Risk Fall Risk  07/30/2020 07/11/2018 07/04/2018 07/08/2017 06/28/2017  Falls in the past year? 0 No No No No  Number falls in past  yr: 0 - - - -  Injury with Fall? 0 - - - -    FALL RISK PREVENTION PERTAINING TO THE HOME:  Any stairs in or around the home? {YES/NO:21197} If so, are there any without handrails? {YES/NO:21197} Home free of loose throw rugs in walkways, pet beds, electrical cords, etc? {YES/NO:21197} Adequate lighting in your home to reduce risk of falls? {YES/NO:21197}  ASSISTIVE DEVICES UTILIZED TO PREVENT FALLS:  Life alert? {YES/NO:21197} Use of a cane, walker or w/c? {YES/NO:21197} Grab bars in the bathroom? {YES/NO:21197} Shower chair or bench in shower? {YES/NO:21197} Elevated toilet seat or a handicapped toilet? {YES/NO:21197}  TIMED UP AND  GO:  Was the test performed? {YES/NO:21197}.  Length of time to ambulate 10 feet:  sec.   {Appearance of Gait:2101803}  Cognitive Function: MMSE - Mini Mental State Exam 07/11/2018 07/08/2017  Not completed: (No Data) (No Data)        Immunizations Immunization History  Administered Date(s) Administered   Fluad Quad(high Dose 65+) 05/15/2019   Influenza, High Dose Seasonal PF 06/19/2015, 06/23/2016, 05/19/2017, 05/03/2018, 06/23/2020   Influenza-Unspecified 05/21/2017   PFIZER Comirnaty(Gray Top)Covid-19 Tri-Sucrose Vaccine 02/03/2021   PFIZER(Purple Top)SARS-COV-2 Vaccination 10/27/2019, 11/17/2019, 06/23/2020   Pneumococcal Conjugate-13 06/23/2016   Pneumococcal Polysaccharide-23 05/06/2015   Td 10/14/2020    {TDAP status:2101805}  {Flu Vaccine status:2101806}  {Pneumococcal vaccine status:2101807}  {Covid-19 vaccine status:2101808}  Qualifies for Shingles Vaccine? {YES/NO:21197}  Zostavax completed {YES/NO:21197}  {Shingrix Completed?:2101804}  Screening Tests Health Maintenance  Topic Date Due   Zoster Vaccines- Shingrix (1 of 2) Never done   INFLUENZA VACCINE  04/20/2021   FOOT EXAM  07/30/2021   HEMOGLOBIN A1C  08/06/2021   OPHTHALMOLOGY EXAM  11/07/2021   TETANUS/TDAP  10/14/2030   COVID-19 Vaccine  Completed    Hepatitis C Screening  Completed   PNA vac Low Risk Adult  Completed   Pneumococcal Vaccine 96-23 Years old  Aged Out   HPV VACCINES  Aged Out    Health Maintenance  Health Maintenance Due  Topic Date Due   Zoster Vaccines- Shingrix (1 of 2) Never done    {Colorectal cancer screening:2101809}  Lung Cancer Screening: (Low Dose CT Chest recommended if Age 52-80 years, 30 pack-year currently smoking OR have quit w/in 15years.) {DOES NOT does:27190::"does not"} qualify.   Lung Cancer Screening Referral: ***  Additional Screening:  Hepatitis C Screening: {DOES NOT does:27190::"does not"} qualify; Completed ***  Vision Screening: Recommended annual ophthalmology exams for early detection of glaucoma and other disorders of the eye. Is the patient up to date with their annual eye exam?  {YES/NO:21197} Who is the provider or what is the name of the office in which the patient attends annual eye exams? *** If pt is not established with a provider, would they like to be referred to a provider to establish care? {YES/NO:21197}.   Dental Screening: Recommended annual dental exams for proper oral hygiene  Community Resource Referral / Chronic Care Management: CRR required this visit?  {YES/NO:21197}  CCM required this visit?  {YES/NO:21197}     Plan:     I have personally reviewed and noted the following in the patient's chart:   Medical and social history Use of alcohol, tobacco or illicit drugs  Current medications and supplements including opioid prescriptions. {Opioid Prescriptions:[phone]} Functional ability and status Nutritional status Physical activity Advanced directives List of other physicians Hospitalizations, surgeries, and ER visits in previous 12 months Vitals Screenings to include cognitive, depression, and falls Referrals and appointments  In addition, I have reviewed and discussed with patient certain preventive protocols, quality metrics, and best  practice recommendations. A written personalized care plan for preventive services as well as general preventive health recommendations were provided to patient.     Randel Pigg, LPN   10/25/9561   Nurse Notes: ***

## 2021-02-26 NOTE — Progress Notes (Addendum)
Subjective:   Trevor Wheeler is a 75 y.o. male who presents for an Initial Medicare Annual Wellness Visit.  Review of Systems  n/a       Objective:    There were no vitals filed for this visit. There is no height or weight on file to calculate BMI.  Advanced Directives 07/11/2018 09/28/2017 07/08/2017 05/23/2015 05/05/2015  Does Patient Have a Medical Advance Directive? No No No No No  Would patient like information on creating a medical advance directive? - No - Patient declined - Yes - Educational materials given -    Current Medications (verified) Outpatient Encounter Medications as of 02/26/2021  Medication Sig   ACCU-CHEK GUIDE test strip USE TO CHECK BLOOD SUGAR 3 TIMES A DAY OR AS NEEDED   aspirin EC 325 MG tablet Take 1 tablet (325 mg total) by mouth daily.   atorvastatin (LIPITOR) 20 MG tablet Take 1 tablet (20 mg total) by mouth daily at 6 PM.   glipiZIDE (GLUCOTROL) 10 MG tablet TAKE 1 TABLET BY MOUTH 2 TIMES DAILY BEFORE EVENING MEAL   Lancets (ACCU-CHEK SOFT TOUCH) lancets Used to check blood sugar 3 times daily   lisinopril (ZESTRIL) 20 MG tablet Take 1 tablet (20 mg total) by mouth daily.   metFORMIN (GLUCOPHAGE) 500 MG tablet Take 1 tablet (500 mg total) by mouth 2 (two) times daily with a meal.   Multiple Vitamin (MULTIVITAMIN) tablet Take 1 tablet by mouth daily.   naproxen sodium (ALEVE) 220 MG tablet Take 220 mg by mouth 2 (two) times daily as needed (for knee pain.).   OVER THE COUNTER MEDICATION Probiotic extra strength, one capsule daily.   OVER THE COUNTER MEDICATION ClearLax, One capful as needed. May take twice a week.   No facility-administered encounter medications on file as of 02/26/2021.    Allergies (verified) Penicillins   History: Past Medical History:  Diagnosis Date   Cataract    CVA (cerebral vascular accident) ([place]) 05/04/2015   L cerebellar, "only problem I have is w/balance" 05/05/2015)   Hypertension    Type II diabetes mellitus ([place])  dx'd 05/05/2015   Past Surgical History:  Procedure Laterality Date   Cataract Right 2018   EP IMPLANTABLE DEVICE N/A 05/07/2015   Procedure: Loop Recorder Insertion;  Surgeon: Sanda Klein, MD;  Location: [facility];  Service: Cardiovascular;  Laterality: N/A;   LOOP RECORDER REMOVAL N/A 09/28/2017   Procedure: LOOP RECORDER REMOVAL;  Surgeon: Sanda Klein, MD;  Location: [facility];  Service: Cardiovascular;  Laterality: N/A;   NO PAST SURGERIES     TEE WITHOUT CARDIOVERSION N/A 05/07/2015   Procedure: TRANSESOPHAGEAL ECHOCARDIOGRAM (TEE);  Surgeon: Skeet Latch, MD;  Location: [facility] ENDOSCOPY;  Service: Cardiovascular;  Laterality: N/A;   Family History  Problem Relation Age of Onset   Heart disease Mother    Colon cancer Brother    Heart disease Brother    Esophageal cancer Neg Hx    Rectal cancer Neg Hx    Stomach cancer Neg Hx    Social History   Socioeconomic History   Marital status: Married    Spouse name: Not on file   Number of children: Not on file   Years of education: Not on file   Highest education level: Not on file  Occupational History   Not on file  Tobacco Use   Smoking status: Former    Packs/day: 1.00    Years: 20.00    Pack years: 20.00  Types: Cigarettes    Quit date: 05/21/1974    Years since quitting: 46.8   Smokeless tobacco: Never   Tobacco comments:    quit smoking in the 1970's/ educated [employer]   Substance and Sexual Activity   Alcohol use: No    Alcohol/week: 0.0 standard drinks   Drug use: No   Sexual activity: Not Currently  Other Topics Concern   Not on file  Social History Narrative   Employed as a Therapist, music.    Married for 30 years   Has two daughters who live locally.    Social Determinants of Health   Financial Resource Strain: Not on file  Food Insecurity: Not on file  Transportation Needs: Not on file  Physical Activity: Not on file  Stress: Not on file  Social Connections: Not on file     Tobacco Counseling Counseling given: Not Answered Tobacco comments: quit smoking in the 1970's/ educated [employer]    Clinical Intake:                 Diabetic/ Yes Nutrition Risk Assessment:  Has the patient had any N/V/D within the last 2 months?  No  Does the patient have any non-healing wounds?  No  Has the patient had any unintentional weight loss or weight gain?  No   Diabetes:  Is the patient diabetic?  Yes  If diabetic, was a CBG obtained today?  No  Did the patient bring in their glucometer from home?  No  How often do you monitor your CBG's? 3x week.   Financial Strains and Diabetes Management:  Are you having any financial strains with the device, your supplies or your medication? No .  Does the patient want to be seen by Chronic Care Management for management of their diabetes?  No  Would the patient like to be referred to a Nutritionist or for Diabetic Management?  No   Diabetic Exams:  Diabetic Eye Exam: Completed 02/2021 Diabetic Foot Exam: Overdue, Pt has been advised about the importance in completing this exam. Pt is scheduled for diabetic foot exam on next office visit .          Activities of Daily Living No flowsheet data found.  Patient Care Team: Dorothyann Peng, NP as PCP - General (Family Medicine) Croitoru, Dani Gobble, MD as Consulting Physician (Cardiology)  Indicate any recent Medical Services you may have received from other than [facility] providers in the past year (date may be approximate).     Assessment:   This is a routine wellness examination for [place].  Hearing/Vision screen No results found.  Dietary issues and exercise activities discussed:     Goals Addressed   None    Depression Screen PHQ 2/9 Scores 07/30/2020 07/11/2018 07/04/2018 07/08/2017 06/28/2017 06/19/2015  PHQ - 2 Score 0 0 0 0 0 0  PHQ- 9 Score 0 - - - - -    Fall Risk Fall Risk  07/30/2020 07/11/2018 07/04/2018 07/08/2017 06/28/2017  Falls in the past  year? 0 No No No No  Number falls in past yr: 0 - - - -  Injury with Fall? 0 - - - -    FALL RISK PREVENTION PERTAINING TO THE HOME:  Any stairs in or around the home? Yes  If so, are there any without handrails? No  Home free of loose throw rugs in walkways, pet beds, electrical cords, etc? Yes  Adequate lighting in your home to reduce risk of falls? Yes   ASSISTIVE DEVICES  UTILIZED TO PREVENT FALLS:  Life alert? No  Use of a cane, walker or w/c? No  Grab bars in the bathroom? No  Shower chair or bench in shower? No  Elevated toilet seat or a handicapped toilet? No   T Cognitive Function: Normal cognitive status assessed by direct observation by this Nurse Health Advisor. No abnormalities found.   MMSE - Mini Mental State Exam 07/11/2018 07/08/2017  Not completed: (No Data) (No Data)        Immunizations Immunization History  Administered Date(s) Administered   Fluad Quad(high Dose 65+) 05/15/2019   Influenza, High Dose Seasonal PF 06/19/2015, 06/23/2016, 05/19/2017, 05/03/2018, 06/23/2020   Influenza-Unspecified 05/21/2017   PFIZER Comirnaty(Gray Top)Covid-19 Tri-Sucrose Vaccine 02/03/2021   PFIZER(Purple Top)SARS-COV-2 Vaccination 10/27/2019, 11/17/2019, 06/23/2020   Pneumococcal Conjugate-13 06/23/2016   Pneumococcal Polysaccharide-23 05/06/2015   Td 10/14/2020    TDAP status: Up to date  Flu Vaccine status: Up to date  Pneumococcal vaccine status: Up to date  Covid-19 vaccine status: Completed vaccines  Qualifies for Shingles Vaccine? Yes   Zostavax completed No   Shingrix Completed?: No.    Education has been provided regarding the importance of this vaccine. Patient has been advised to call insurance company to determine out of pocket expense if they have not yet received this vaccine. Advised may also receive vaccine at local pharmacy or Health Dept. Verbalized acceptance and understanding.  Screening Tests Health Maintenance  Topic Date Due   Zoster  Vaccines- Shingrix (1 of 2) Never done   INFLUENZA VACCINE  04/20/2021   FOOT EXAM  07/30/2021   HEMOGLOBIN A1C  08/06/2021   OPHTHALMOLOGY EXAM  11/07/2021   TETANUS/TDAP  10/14/2030   COVID-19 Vaccine  Completed   Hepatitis C Screening  Completed   PNA vac Low Risk Adult  Completed   Pneumococcal Vaccine 38-46 Years old  Aged Out   HPV VACCINES  Aged Out    Health Maintenance  Health Maintenance Due  Topic Date Due   Zoster Vaccines- Shingrix (1 of 2) Never done    Colorectal cancer screening: Type of screening: Colonoscopy. Completed 12/05/2018. Repeat every 10 years  Lung Cancer Screening: (Low Dose CT Chest recommended if Age 50-80 years, 30 pack-year currently smoking OR have quit w/in 15years.) does not qualify.   Lung Cancer Screening Referral: n/a  Additional Screening:  Hepatitis C Screening: does qualify; Completed 06/28/2017  Vision Screening: Recommended annual ophthalmology exams for early detection of glaucoma and other disorders of the eye. Is the patient up to date with their annual eye exam?  Yes  Who is the provider or what is the name of the office in which the patient attends annual eye exams? [facility]  If pt is not established with a provider, would they like to be referred to a provider to establish care? No .   Dental Screening: Recommended annual dental exams for proper oral hygiene  Community Resource Referral / Chronic Care Management: CRR required this visit?  No   CCM required this visit?  No      Plan:     I have personally reviewed and noted the following in the patient's chart:   Medical and social history Use of alcohol, tobacco or illicit drugs  Current medications and supplements including opioid prescriptions. Patient is not currently taking opioid prescriptions. Functional ability and status Nutritional status Physical activity Advanced directives List of other physicians Hospitalizations, surgeries, and ER  visits in previous 12 months Vitals Screenings to include cognitive,  depression, and falls Referrals and appointments  In addition, I have reviewed and discussed with patient certain preventive protocols, quality metrics, and best practice recommendations. A written personalized care plan for preventive services as well as general preventive health recommendations were provided to patient.     Randel Pigg, LPN   01/18/8334   Nurse Notes: none

## 2021-07-15 ENCOUNTER — Other Ambulatory Visit: Payer: Self-pay | Admitting: Adult Health

## 2021-07-15 DIAGNOSIS — E1159 Type 2 diabetes mellitus with other circulatory complications: Secondary | ICD-10-CM

## 2021-07-31 ENCOUNTER — Encounter: Payer: Self-pay | Admitting: Adult Health

## 2021-07-31 ENCOUNTER — Other Ambulatory Visit: Payer: Self-pay

## 2021-07-31 ENCOUNTER — Other Ambulatory Visit: Payer: Self-pay | Admitting: Adult Health

## 2021-07-31 ENCOUNTER — Ambulatory Visit (INDEPENDENT_AMBULATORY_CARE_PROVIDER_SITE_OTHER): Payer: Medicare PPO | Admitting: Adult Health

## 2021-07-31 VITALS — BP 122/82 | HR 75 | Temp 98.5°F | Ht 69.73 in | Wt 238.0 lb

## 2021-07-31 DIAGNOSIS — I10 Essential (primary) hypertension: Secondary | ICD-10-CM

## 2021-07-31 DIAGNOSIS — Z0000 Encounter for general adult medical examination without abnormal findings: Secondary | ICD-10-CM

## 2021-07-31 DIAGNOSIS — E782 Mixed hyperlipidemia: Secondary | ICD-10-CM | POA: Diagnosis not present

## 2021-07-31 DIAGNOSIS — E1159 Type 2 diabetes mellitus with other circulatory complications: Secondary | ICD-10-CM | POA: Diagnosis not present

## 2021-07-31 DIAGNOSIS — Z125 Encounter for screening for malignant neoplasm of prostate: Secondary | ICD-10-CM

## 2021-07-31 DIAGNOSIS — Z8673 Personal history of transient ischemic attack (TIA), and cerebral infarction without residual deficits: Secondary | ICD-10-CM

## 2021-07-31 DIAGNOSIS — I639 Cerebral infarction, unspecified: Secondary | ICD-10-CM

## 2021-07-31 LAB — CBC WITH DIFFERENTIAL/PLATELET
Basophils Absolute: 0 10*3/uL (ref 0.0–0.1)
Basophils Relative: 0.6 % (ref 0.0–3.0)
Eosinophils Absolute: 0.2 10*3/uL (ref 0.0–0.7)
Eosinophils Relative: 2.2 % (ref 0.0–5.0)
HCT: 42.9 % (ref 39.0–52.0)
Hemoglobin: 13.8 g/dL (ref 13.0–17.0)
Lymphocytes Relative: 36.7 % (ref 12.0–46.0)
Lymphs Abs: 2.5 10*3/uL (ref 0.7–4.0)
MCHC: 32.2 g/dL (ref 30.0–36.0)
MCV: 85.4 fl (ref 78.0–100.0)
Monocytes Absolute: 0.5 10*3/uL (ref 0.1–1.0)
Monocytes Relative: 7.5 % (ref 3.0–12.0)
Neutro Abs: 3.7 10*3/uL (ref 1.4–7.7)
Neutrophils Relative %: 53 % (ref 43.0–77.0)
Platelets: 186 10*3/uL (ref 150.0–400.0)
RBC: 5.03 Mil/uL (ref 4.22–5.81)
RDW: 13.6 % (ref 11.5–15.5)
WBC: 6.9 10*3/uL (ref 4.0–10.5)

## 2021-07-31 LAB — COMPREHENSIVE METABOLIC PANEL
ALT: 34 U/L (ref 0–53)
AST: 25 U/L (ref 0–37)
Albumin: 4.3 g/dL (ref 3.5–5.2)
Alkaline Phosphatase: 97 U/L (ref 39–117)
BUN: 14 mg/dL (ref 6–23)
CO2: 27 mEq/L (ref 19–32)
Calcium: 9.3 mg/dL (ref 8.4–10.5)
Chloride: 105 mEq/L (ref 96–112)
Creatinine, Ser: 0.87 mg/dL (ref 0.40–1.50)
GFR: 85.02 mL/min (ref >60.00)
Glucose, Bld: 161 mg/dL — ABNORMAL HIGH (ref 70–99)
Potassium: 4.6 mEq/L (ref 3.5–5.1)
Sodium: 138 mEq/L (ref 135–145)
Total Bilirubin: 0.7 mg/dL (ref 0.2–1.2)
Total Protein: 6.6 g/dL (ref 6.0–8.3)

## 2021-07-31 LAB — LIPID PANEL
Cholesterol: 140 mg/dL (ref 0–200)
HDL: 34.7 mg/dL — ABNORMAL LOW (ref >39.00)
LDL Cholesterol: 79 mg/dL (ref 0–99)
NonHDL: 105.38
Total CHOL/HDL Ratio: 4
Triglycerides: 132 mg/dL (ref 0.0–149.0)
VLDL: 26.4 mg/dL (ref 0.0–40.0)

## 2021-07-31 LAB — PSA: PSA: 0.95 ng/mL (ref 0.10–4.00)

## 2021-07-31 LAB — HEMOGLOBIN A1C: Hgb A1c MFr Bld: 7.4 % — ABNORMAL HIGH (ref 4.6–6.5)

## 2021-07-31 LAB — TSH: TSH: 0.7 u[IU]/mL (ref 0.35–5.50)

## 2021-07-31 MED ORDER — GLIPIZIDE 10 MG PO TABS: ORAL_TABLET | ORAL | 1 refills | Status: DC

## 2021-07-31 MED ORDER — METFORMIN HCL 500 MG PO TABS: 500.0000 mg | ORAL_TABLET | Freq: Two times a day (BID) | ORAL | 1 refills | Status: DC

## 2021-07-31 MED ORDER — EMPAGLIFLOZIN 10 MG PO TABS: 10.0000 mg | ORAL_TABLET | Freq: Every day | ORAL | 2 refills | Status: DC

## 2021-07-31 MED ORDER — LISINOPRIL 20 MG PO TABS
20.0000 mg | ORAL_TABLET | Freq: Every day | ORAL | 3 refills | Status: DC
Start: 2021-07-31 — End: 2022-07-13

## 2021-07-31 MED ORDER — ATORVASTATIN CALCIUM 20 MG PO TABS
20.0000 mg | ORAL_TABLET | Freq: Every day | ORAL | 3 refills | Status: DC
Start: 2021-07-31 — End: 2022-07-13

## 2021-07-31 NOTE — Patient Instructions (Addendum)
It was great seeing you today   We will follow up with you regarding your lab work   I will see you back in 6 months for diabetic check   Keep working on weight loss through diet and exericse

## 2021-07-31 NOTE — Progress Notes (Signed)
Subjective:    Patient ID: Trevor Wheeler, male    DOB: 03-19-1946, 75 y.o.   MRN: [id number]  HPI Patient presents for yearly preventative medicine examination. He is a pleasant 75 year old male who  has a past medical history of Cataract, CVA (cerebral vascular accident) ([place]) (05/04/2015), Hypertension, and Type II diabetes mellitus ([place]) (dx'd 05/05/2015).  DM -maintained on metformin 500 mg BID as well as glipizide 10 mg twice daily.  He does occasionally monitor his blood sugars at home and reports readings in the 100-160's.  Continues to walk on a daily basis and tries to stay active with his yard maintenance company.  He denies episodes of hypoglycemia Lab Results  Component Value Date   HGBA1C 7.1 (A) 02/03/2021   Hypertension-managed with lisinopril 20 mg.  He denies shortness of breath, dizziness, lightheadedness, or syncopal episodes BP Readings from Last 3 Encounters:  07/31/21 122/82  02/03/21 124/80  11/04/20 136/80   Hyperlipidemia-currently prescribed Lipitor 20 mg daily.  He has had excellent control of his lipids in the past.  He denies myalgia or fatigue Lab Results  Component Value Date   CHOL 132 07/30/2020   HDL 32.40 (L) 07/30/2020   LDLCALC 76 07/30/2020   LDLDIRECT 182.2 08/05/2010   TRIG 120.0 07/30/2020   CHOLHDL 4 07/30/2020   S/p cryptogenic stroke-currently on aspirin.  3 her loop recorder did not show atrial fibrillation and this was removed in 2019.  He has not had any neurologic or cardiac events since.   All immunizations and health maintenance protocols were reviewed with the patient and needed orders were placed.  Appropriate screening laboratory values were ordered for the patient including screening of hyperlipidemia, renal function and hepatic function. If indicated by BPH, a PSA was ordered.  Medication reconciliation,  past medical history, social history, problem list and allergies were reviewed in detail with the patient  Goals were  established with regard to weight loss, exercise, and  diet in compliance with medications  Wt Readings from Last 3 Encounters:  07/31/21 238 lb (108 kg)  02/03/21 233 lb 2 oz (105.7 kg)  11/04/20 241 lb (109.3 kg)   He is up to date on routine colon cancer screening   Has no acute complaints today   Review of Systems  Constitutional: Negative.   HENT: Negative.    Eyes: Negative.   Respiratory: Negative.    Cardiovascular: Negative.   Gastrointestinal: Negative.   Endocrine: Negative.   Genitourinary: Negative.   Musculoskeletal: Negative.   Skin: Negative.   Allergic/Immunologic: Negative.   Neurological: Negative.   Hematological: Negative.   Psychiatric/Behavioral: Negative.    All other systems reviewed and are negative.  Past Medical History:  Diagnosis Date   Cataract    CVA (cerebral vascular accident) ([place]) 05/04/2015   L cerebellar, "only problem I have is w/balance" 05/05/2015)   Hypertension    Type II diabetes mellitus ([place]) dx'd 05/05/2015    Social History   Socioeconomic History   Marital status: Married    Spouse name: Not on file   Number of children: Not on file   Years of education: Not on file   Highest education level: Not on file  Occupational History   Not on file  Tobacco Use   Smoking status: Former    Packs/day: 1.00    Years: 20.00    Pack years: 20.00    Types: Cigarettes    Quit date: 05/21/1974    Years since quitting:  47.2   Smokeless tobacco: Never   Tobacco comments:    quit smoking in the 1970's/ educated [employer]   Substance and Sexual Activity   Alcohol use: No    Alcohol/week: 0.0 standard drinks   Drug use: No   Sexual activity: Not Currently  Other Topics Concern   Not on file  Social History Narrative   Employed as a Therapist, music.    Married for 30 years   Has two daughters who live locally.    Social Determinants of Health   Financial Resource Strain: Low Risk    Difficulty of Paying Living Expenses:  Not hard at all  Food Insecurity: No Food Insecurity   Worried About Charity fundraiser in the Last Year: Never true   [place] in the Last Year: Never true  Transportation Needs: No Transportation Needs   Lack of Transportation (Medical): No   Lack of Transportation (Non-Medical): No  Physical Activity: Insufficiently Active   Days of Exercise per Week: 5 days   Minutes of Exercise per Session: 20 min  Stress: No Stress Concern Present   Feeling of Stress : Not at all  Social Connections: Socially Integrated   Frequency of Communication with Friends and Family: Twice a week   Frequency of Social Gatherings with Friends and Family: Twice a week   Attends Religious Services: More than 4 times per year   Active Member of Clubs or Organizations: Yes   Attends Archivist Meetings: 1 to 4 times per year   Marital Status: Married  Human resources officer Violence: Not At Risk   Fear of Current or Ex-Partner: No   Emotionally Abused: No   Physically Abused: No   Sexually Abused: No    Past Surgical History:  Procedure Laterality Date   Cataract Right 2018   EP IMPLANTABLE DEVICE N/A 05/07/2015   Procedure: Loop Recorder Insertion;  Surgeon: Sanda Klein, MD;  Location: [facility];  Service: Cardiovascular;  Laterality: N/A;   LOOP RECORDER REMOVAL N/A 09/28/2017   Procedure: LOOP RECORDER REMOVAL;  Surgeon: Sanda Klein, MD;  Location: [facility];  Service: Cardiovascular;  Laterality: N/A;   NO PAST SURGERIES     TEE WITHOUT CARDIOVERSION N/A 05/07/2015   Procedure: TRANSESOPHAGEAL ECHOCARDIOGRAM (TEE);  Surgeon: Skeet Latch, MD;  Location: [facility];  Service: Cardiovascular;  Laterality: N/A;    Family History  Problem Relation Age of Onset   Heart disease Mother    Colon cancer Brother    Heart disease Brother    Esophageal cancer Neg Hx    Rectal cancer Neg Hx    Stomach cancer Neg Hx     Allergies  Allergen Reactions   Penicillins  Hives    Has patient had a PCN reaction causing immediate rash, facial/tongue/throat swelling, SOB or lightheadedness with hypotension: No Has patient had a PCN reaction causing severe rash involving mucus membranes or skin necrosis: No Has patient had a PCN reaction that required hospitalization: No Has patient had a PCN reaction occurring within the last 10 years: No If all of the above answers are "NO", then may proceed with Cephalosporin use.     Current Outpatient Medications on File Prior to Visit  Medication Sig Dispense Refill   ACCU-CHEK GUIDE test strip USE TO CHECK BLOOD SUGAR 3 TIMES A DAY OR AS NEEDED 100 strip 3   aspirin EC 325 MG tablet Take 1 tablet (325 mg total) by mouth daily. 30 tablet  0   atorvastatin (LIPITOR) 20 MG tablet Take 1 tablet (20 mg total) by mouth daily at 6 PM. 90 tablet 3   glipiZIDE (GLUCOTROL) 10 MG tablet TAKE 1 TABLET BY MOUTH 2 TIMES DAILY BEFORE EVENING MEAL 60 tablet 0   Lancets (ACCU-CHEK SOFT TOUCH) lancets Used to check blood sugar 3 times daily 100 each 12   lisinopril (ZESTRIL) 20 MG tablet Take 1 tablet (20 mg total) by mouth daily. 90 tablet 3   metFORMIN (GLUCOPHAGE) 500 MG tablet Take 1 tablet (500 mg total) by mouth 2 (two) times daily with a meal. 180 tablet 1   Multiple Vitamin (MULTIVITAMIN) tablet Take 1 tablet by mouth daily.     naproxen sodium (ALEVE) 220 MG tablet Take 220 mg by mouth 2 (two) times daily as needed (for knee pain.).     OVER THE COUNTER MEDICATION Probiotic extra strength, one capsule daily.     OVER THE COUNTER MEDICATION ClearLax, One capful as needed. May take twice a week.     No current facility-administered medications on file prior to visit.    BP 122/82   Pulse 75   Temp 98.5 F (36.9 C) (Oral)   Ht 5' 9.73" (1.771 m)   Wt 238 lb (108 kg)   SpO2 98%   BMI 34.41 kg/m        Objective:   Physical Exam Vitals and nursing note reviewed.  Constitutional:      General: He is not in acute  distress.    Appearance: Normal appearance. He is well-developed and normal weight.  HENT:     Head: Normocephalic and atraumatic.     Right Ear: Tympanic membrane, ear canal and external ear normal. There is no impacted cerumen.     Left Ear: Tympanic membrane, ear canal and external ear normal. There is no impacted cerumen.     Nose: Nose normal. No congestion or rhinorrhea.     Mouth/Throat:     Mouth: Mucous membranes are moist.     Pharynx: Oropharynx is clear. No oropharyngeal exudate or posterior oropharyngeal erythema.  Eyes:     General:        Right eye: No discharge.        Left eye: No discharge.     Extraocular Movements: Extraocular movements intact.     Conjunctiva/sclera: Conjunctivae normal.     Pupils: Pupils are equal, round, and reactive to light.  Neck:     Vascular: No carotid bruit.     Trachea: No tracheal deviation.  Cardiovascular:     Rate and Rhythm: Normal rate and regular rhythm.     Pulses: Normal pulses.     Heart sounds: Normal heart sounds. No murmur heard.   No friction rub. No gallop.  Pulmonary:     Effort: Pulmonary effort is normal. No respiratory distress.     Breath sounds: Normal breath sounds. No stridor. No wheezing, rhonchi or rales.  Chest:     Chest wall: No tenderness.  Abdominal:     General: Bowel sounds are normal. There is no distension.     Palpations: Abdomen is soft. There is no mass.     Tenderness: There is no abdominal tenderness. There is no right CVA tenderness, left CVA tenderness, guarding or rebound.     Hernia: No hernia is present.  Musculoskeletal:        General: No swelling, tenderness, deformity or signs of injury. Normal range of motion.     Right  lower leg: No edema.     Left lower leg: No edema.  Lymphadenopathy:     Cervical: No cervical adenopathy.  Skin:    General: Skin is warm and dry.     Capillary Refill: Capillary refill takes less than 2 seconds.     Coloration: Skin is not jaundiced or pale.      Findings: No bruising, erythema, lesion or rash.  Neurological:     General: No focal deficit present.     Mental Status: He is alert and oriented to person, place, and time.     Cranial Nerves: No cranial nerve deficit.     Sensory: No sensory deficit.     Motor: No weakness.     Coordination: Coordination normal.     Gait: Gait normal.     Deep Tendon Reflexes: Reflexes normal.  Psychiatric:        Mood and Affect: Mood normal.        Behavior: Behavior normal.        Thought Content: Thought content normal.        Judgment: Judgment normal.      Assessment & Plan:  1. Routine general medical examination at a health care facility - Follow up in one year  - Continue to work on weight loss through diet and exercise  - CBC with Differential/Platelet; Future - Comprehensive metabolic panel; Future - Hemoglobin A1c; Future - Lipid panel; Future - TSH; Future  2. Controlled type 2 diabetes mellitus with other circulatory complication, without long-term current use of insulin ([place]) - Consider increasing metformin  - [place] 6 month follow up  - CBC with Differential/Platelet; Future - Comprehensive metabolic panel; Future - Hemoglobin A1c; Future - Lipid panel; Future - TSH; Future - metFORMIN (GLUCOPHAGE) 500 MG tablet; Take 1 tablet (500 mg total) by mouth 2 (two) times daily with a meal.  Dispense: 180 tablet; Refill: 1 - glipiZIDE (GLUCOTROL) 10 MG tablet; TAKE 1 TABLET BY MOUTH 2 TIMES DAILY BEFORE EVENING MEAL  Dispense: 180 tablet; Refill: 1  3. Essential hypertension - well controlled. No change in medications  - CBC with Differential/Platelet; Future - Comprehensive metabolic panel; Future - Hemoglobin A1c; Future - Lipid panel; Future - TSH; Future - lisinopril (ZESTRIL) 20 MG tablet; Take 1 tablet (20 mg total) by mouth daily.  Dispense: 90 tablet; Refill: 3  4. Mixed hyperlipidemia - Continue with Lipitor 20 mg  - CBC with Differential/Platelet; Future -  Comprehensive metabolic panel; Future - Hemoglobin A1c; Future - Lipid panel; Future - TSH; Future  5. Prostate cancer screening  - PSA; Future  6. Cryptogenic stroke ([place])  - CBC with Differential/Platelet; Future - Comprehensive metabolic panel; Future - Hemoglobin A1c; Future - Lipid panel; Future - TSH; Future  Dorothyann Peng, NP

## 2021-07-31 NOTE — Addendum Note (Signed)
Addended by: Amanda Cockayne on: 07/31/2021 07:22 AM   Modules accepted: Orders

## 2021-11-03 ENCOUNTER — Encounter: Payer: Self-pay | Admitting: Adult Health

## 2021-11-03 ENCOUNTER — Ambulatory Visit (INDEPENDENT_AMBULATORY_CARE_PROVIDER_SITE_OTHER): Payer: Medicare PPO | Admitting: Adult Health

## 2021-11-03 VITALS — BP 126/82 | HR 75 | Temp 98.6°F | Ht 69.75 in | Wt 232.0 lb

## 2021-11-03 DIAGNOSIS — E1159 Type 2 diabetes mellitus with other circulatory complications: Secondary | ICD-10-CM

## 2021-11-03 DIAGNOSIS — I10 Essential (primary) hypertension: Secondary | ICD-10-CM | POA: Diagnosis not present

## 2021-11-03 LAB — POCT GLYCOSYLATED HEMOGLOBIN (HGB A1C): Hemoglobin A1C: 6.5 % — AB (ref 4.0–5.6)

## 2021-11-03 NOTE — Progress Notes (Signed)
Subjective:    Patient ID: Trevor Wheeler, male    DOB: 07/23/1946, 76 y.o.   MRN: [id number]  HPI 76 year old male who  has a past medical history of Cataract, CVA (cerebral vascular accident) ([facility]) (05/04/2015), Hypertension, and Type II diabetes mellitus ([place]) (dx'd 05/05/2015).  He presents to the office today for 81-month follow-up regarding diabetes and hypertension.  Diabetes mellitus type II-maintained on metformin 500 mg twice daily as well as glipizide 10 mg twice daily and jardiance 10 mg was added 3 months ago.  He does occasionally monitor his blood sugars at home and reports readings in the 100s to 140's but has been having some intermittent episodes of hypoglycemia in the early morning.   Continues to walk on a daily basis and tries to stay active with his yard maintenance company. He is staying well hydrated while on Jardiance    Lab Results  Component Value Date   HGBA1C 7.4 (H) 07/31/2021   Wt Readings from Last 3 Encounters:  11/03/21 232 lb (105.2 kg)  07/31/21 238 lb (108 kg)  02/03/21 233 lb 2 oz (105.7 kg)   HTN -managed with lisinopril 20 mg daily.  He denies shortness of breath, dizziness, lightheadedness, chest pain, or syncopal episode BP Readings from Last 3 Encounters:  11/03/21 126/82  07/31/21 122/82  02/03/21 124/80    Review of Systems See HPI   Past Medical History:  Diagnosis Date   Cataract    CVA (cerebral vascular accident) ([place]) 05/04/2015   L cerebellar, "only problem I have is w/balance" 05/05/2015)   Hypertension    Type II diabetes mellitus ([place]) dx'd 05/05/2015    Social History   Socioeconomic History   Marital status: Married    Spouse name: Not on file   Number of children: Not on file   Years of education: Not on file   Highest education level: GED or equivalent  Occupational History   Not on file  Tobacco Use   Smoking status: Former    Packs/day: 1.00    Years: 20.00    Pack years: 20.00    Types: Cigarettes    Quit  date: 05/21/1974    Years since quitting: 47.4   Smokeless tobacco: Never   Tobacco comments:    quit smoking in the 1970's/ educated [employer]   Substance and Sexual Activity   Alcohol use: No    Alcohol/week: 0.0 standard drinks   Drug use: No   Sexual activity: Not Currently  Other Topics Concern   Not on file  Social History Narrative   Employed as a Therapist, music.    Married for 30 years   Has two daughters who live locally.    Social Determinants of Health   Financial Resource Strain: Low Risk    Difficulty of Paying Living Expenses: Not hard at all  Food Insecurity: No Food Insecurity   Worried About Charity fundraiser in the Last Year: Never true   [place] in the Last Year: Never true  Transportation Needs: No Transportation Needs   Lack of Transportation (Medical): No   Lack of Transportation (Non-Medical): No  Physical Activity: Sufficiently Active   Days of Exercise per Week: 7 days   Minutes of Exercise per Session: 40 min  Stress: No Stress Concern Present   Feeling of Stress : Not at all  Social Connections: Socially Integrated   Frequency of Communication with Friends and Family: More than three times a week  Frequency of Social Gatherings with Friends and Family: Three times a week   Attends Religious Services: 1 to 4 times per year   Active Member of Clubs or Organizations: Yes   Attends Music therapist: More than 4 times per year   Marital Status: Married  Human resources officer Violence: Not At Risk   Fear of Current or Ex-Partner: No   Emotionally Abused: No   Physically Abused: No   Sexually Abused: No    Past Surgical History:  Procedure Laterality Date   Cataract Right 2018   EP IMPLANTABLE DEVICE N/A 05/07/2015   Procedure: Loop Recorder Insertion;  Surgeon: Sanda Klein, MD;  Location: [facility];  Service: Cardiovascular;  Laterality: N/A;   LOOP RECORDER REMOVAL N/A 09/28/2017   Procedure: LOOP RECORDER REMOVAL;   Surgeon: Sanda Klein, MD;  Location: [facility];  Service: Cardiovascular;  Laterality: N/A;   NO PAST SURGERIES     TEE WITHOUT CARDIOVERSION N/A 05/07/2015   Procedure: TRANSESOPHAGEAL ECHOCARDIOGRAM (TEE);  Surgeon: Skeet Latch, MD;  Location: [facility] END[facility];  Service: Cardiovascular;  Laterality: N/A;    Family History  Problem Relation Age of Onset   Heart disease Mother    Colon cancer Brother    Heart disease Brother    Esophageal cancer Neg Hx    Rectal cancer Neg Hx    Stomach cancer Neg Hx     Allergies  Allergen Reactions   Penicillins Hives    Has patient had a PCN reaction causing immediate rash, facial/tongue/throat swelling, SOB or lightheadedness with hypotension: No Has patient had a PCN reaction causing severe rash involving mucus membranes or skin necrosis: No Has patient had a PCN reaction that required hospitalization: No Has patient had a PCN reaction occurring within the last 10 years: No If all of the above answers are "NO", then may proceed with Cephalosporin use.     Current Outpatient Medications on File Prior to Visit  Medication Sig Dispense Refill   ACCU-CHEK GUIDE test strip USE TO CHECK BLOOD SUGAR 3 TIMES A DAY OR AS NEEDED 100 strip 3   aspirin EC 325 MG tablet Take 1 tablet (325 mg total) by mouth daily. 30 tablet 0   atorvastatin (LIPITOR) 20 MG tablet Take 1 tablet (20 mg total) by mouth daily at 6 PM. 90 tablet 3   empagliflozin (JARDIANCE) 10 MG TABS tablet Take 1 tablet (10 mg total) by mouth daily before breakfast. 90 tablet 2   glipiZIDE (GLUCOTROL) 10 MG tablet TAKE 1 TABLET BY MOUTH 2 TIMES DAILY BEFORE EVENING MEAL 180 tablet 1   Lancets (ACCU-CHEK SOFT TOUCH) lancets Used to check blood sugar 3 times daily 100 each 12   lisinopril (ZESTRIL) 20 MG tablet Take 1 tablet (20 mg total) by mouth daily. 90 tablet 3   Multiple Vitamin (MULTIVITAMIN) tablet Take 1 tablet by mouth daily.     naproxen sodium (ALEVE) 220 MG tablet  Take 220 mg by mouth 2 (two) times daily as needed (for knee pain.).     OVER THE COUNTER MEDICATION Probiotic extra strength, one capsule daily.     OVER THE COUNTER MEDICATION ClearLax, One capful as needed. May take twice a week.     metFORMIN (GLUCOPHAGE) 500 MG tablet Take 1 tablet (500 mg total) by mouth 2 (two) times daily with a meal. 180 tablet 1   No current facility-administered medications on file prior to visit.    BP 126/82    Pulse  75    Temp 98.6 F (37 C) (Oral)    Ht 5' 9.75" (1.772 m)    Wt 232 lb (105.2 kg)    SpO2 96%    BMI 33.53 kg/m        Objective:   Physical Exam Vitals and nursing note reviewed.  Constitutional:      Appearance: Normal appearance.  Cardiovascular:     Rate and Rhythm: Normal rate and regular rhythm.     Pulses: Normal pulses.     Heart sounds: Normal heart sounds.  Pulmonary:     Effort: Pulmonary effort is normal.     Breath sounds: Normal breath sounds.  Musculoskeletal:        General: Normal range of motion.  Skin:    General: Skin is warm and dry.  Neurological:     General: No focal deficit present.     Mental Status: He is alert and oriented to person, place, and time.  Psychiatric:        Mood and Affect: Mood normal.        Behavior: Behavior normal.        Thought Content: Thought content normal.        Judgment: Judgment normal.      Assessment & Plan:  1. Controlled type 2 diabetes mellitus with other circulatory complication, without long-term current use of insulin ([facility])  - POC HgB A1c- 6.5 - has improved and at goal  - Will have him stop Glipizide 10 mg at bed time  - Continue with Metformin and Jardiance  - Follow up in 6 months   2. Essential hypertension - well controlled - No change   Dorothyann Peng, NP

## 2021-11-03 NOTE — Patient Instructions (Addendum)
I am going to have you stop the nighttime glipizide   Your A1c dropped from 7.4 to 6.5   Follow up in 6 months

## 2021-11-17 DIAGNOSIS — E113293 Type 2 diabetes mellitus with mild nonproliferative diabetic retinopathy without macular edema, bilateral: Secondary | ICD-10-CM | POA: Diagnosis not present

## 2021-11-17 DIAGNOSIS — H2512 Age-related nuclear cataract, left eye: Secondary | ICD-10-CM | POA: Diagnosis not present

## 2021-11-17 DIAGNOSIS — H524 Presbyopia: Secondary | ICD-10-CM | POA: Diagnosis not present

## 2021-11-17 LAB — HM DIABETES EYE EXAM

## 2021-11-18 ENCOUNTER — Encounter: Payer: Self-pay | Admitting: Adult Health

## 2022-01-22 ENCOUNTER — Other Ambulatory Visit: Payer: Self-pay | Admitting: Adult Health

## 2022-01-22 DIAGNOSIS — E1159 Type 2 diabetes mellitus with other circulatory complications: Secondary | ICD-10-CM

## 2022-01-28 ENCOUNTER — Encounter: Payer: Self-pay | Admitting: Adult Health

## 2022-01-28 ENCOUNTER — Ambulatory Visit: Payer: Medicare PPO | Admitting: Adult Health

## 2022-01-28 VITALS — BP 140/82 | HR 70 | Temp 98.5°F | Ht 69.75 in | Wt 234.0 lb

## 2022-01-28 DIAGNOSIS — E1159 Type 2 diabetes mellitus with other circulatory complications: Secondary | ICD-10-CM

## 2022-01-28 DIAGNOSIS — I10 Essential (primary) hypertension: Secondary | ICD-10-CM

## 2022-01-28 LAB — POCT GLYCOSYLATED HEMOGLOBIN (HGB A1C): HbA1c, POC (controlled diabetic range): 6.4 % (ref 0.0–7.0)

## 2022-01-28 LAB — BASIC METABOLIC PANEL
BUN: 12 mg/dL (ref 6–23)
CO2: 25 mEq/L (ref 19–32)
Calcium: 9.2 mg/dL (ref 8.4–10.5)
Chloride: 105 mEq/L (ref 96–112)
Creatinine, Ser: 0.88 mg/dL (ref 0.40–1.50)
GFR: 84.43 mL/min (ref >60.00)
Glucose, Bld: 123 mg/dL — ABNORMAL HIGH (ref 70–99)
Potassium: 4.2 mEq/L (ref 3.5–5.1)
Sodium: 139 mEq/L (ref 135–145)

## 2022-01-28 NOTE — Progress Notes (Signed)
? ?Subjective:  ? ? Patient ID: Trevor Wheeler, male    DOB: 1946-04-01, 76 y.o.   MRN: [id number] ? ?HPI ?76 year old male who is being evaluated today for 72-monthfollow-up regarding diabetes and hypertension. ? ?Diabetes mellitus type 2-he is currently maintained on metformin 500 mg twice daily, Jardiance 10 mg daily and glipizide 10 mg daily.  During his last visit 3 months ago we decreased his glipizide from twice daily dosing to daily dosing since his A1c dropped from 7.4-6.5.  He was having some intermittent episodes of hypoglycemia early in the morning. Today he reports that he did not stop taking the glipizide and continues to take two.   Today he reports that his blood sugars have been consistently in the 100s to 140s.  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  He is trying to eat healthy. ? ?Wt Readings from Last 3 Encounters:  ?01/28/22 234 lb (106.1 kg)  ?11/03/21 232 lb (105.2 kg)  ?07/31/21 238 lb (108 kg)  ? ?Hypertension-managed with lisinopril 20 mg daily. He denies dizziness, lightheadedness, or syncopal episodes ?BP Readings from Last 3 Encounters:  ?01/28/22 140/82  ?11/03/21 126/82  ?07/31/21 122/82  ? ? ?Review of Systems ?See HPI  ? ?Past Medical History:  ?Diagnosis Date  ? Cataract   ? CVA (cerebral vascular accident) ([facility] 05/04/2015  ? L cerebellar, "only problem I have is w/balance" 05/05/2015)  ? Hypertension   ? Type II diabetes mellitus ([facility] dx'd 05/05/2015  ? ? ?Social History  ? ?Socioeconomic History  ? Marital status: Married  ?  Spouse name: Not on file  ? Number of children: Not on file  ? Years of education: Not on file  ? Highest education level: GED or equivalent  ?Occupational History  ? Not on file  ?Tobacco Use  ? Smoking status: Former  ?  Packs/day: 1.00  ?  Years: 20.00  ?  Pack years: 20.00  ?  Types: Cigarettes  ?  Quit date: 05/21/1974  ?  Years since quitting: 47.7  ? Smokeless tobacco: Never  ? Tobacco comments:   ?  quit smoking in the 1970's/ educated [employer]   ?Substance and Sexual Activity  ? Alcohol use: No  ?  Alcohol/week: 0.0 standard drinks  ? Drug use: No  ? Sexual activity: Not Currently  ?Other Topics Concern  ? Not on file  ?Social History Narrative  ? Employed as a MTherapist, music   ? Married for 30 years  ? Has two daughters who live locally.   ? ?Social Determinants of Health  ? ?Financial Resource Strain: Low Risk   ? Difficulty of Paying Living Expenses: Not hard at all  ?Food Insecurity: No Food Insecurity  ? Worried About RCharity fundraiserin the Last Year: Never true  ? Ran Out of Food in the Last Year: Never true  ?Transportation Needs: No Transportation Needs  ? Lack of Transportation (Medical): No  ? Lack of Transportation (Non-Medical): No  ?Physical Activity: Sufficiently Active  ? Days of Exercise per Week: 7 days  ? Minutes of Exercise per Session: 40 min  ?Stress: No Stress Concern Present  ? Feeling of Stress : Not at all  ?Social Connections: Socially Integrated  ? Frequency of Communication with Friends and Family: More than three times a week  ? Frequency of Social Gatherings with Friends and Family: Three times a week  ? Attends Religious Services:  1 to 4 times per year  ? Active Member of Clubs or Organizations: Yes  ? Attends Archivist Meetings: More than 4 times per year  ? Marital Status: Married  ?Intimate Partner Violence: Not At Risk  ? Fear of Current or Ex-Partner: No  ? Emotionally Abused: No  ? Physically Abused: No  ? Sexually Abused: No  ? ? ?Past Surgical History:  ?Procedure Laterality Date  ? Cataract Right 2018  ? EP IMPLANTABLE DEVICE N/A 05/07/2015  ? Procedure: Loop Recorder Insertion;  Surgeon: Sanda Klein, MD;  Location: [facility];  Service: Cardiovascular;  Laterality: N/A;  ? LOOP RECORDER REMOVAL N/A 09/28/2017  ? Procedure: LOOP RECORDER REMOVAL;  Surgeon: Sanda Klein, MD;  Location: [facility];  Service: Cardiovascular;   Laterality: N/A;  ? NO PAST SURGERIES    ? TEE WITHOUT CARDIOVERSION N/A 05/07/2015  ? Procedure: TRANSESOPHAGEAL ECHOCARDIOGRAM (TEE);  Surgeon: Skeet Latch, MD;  Location: [facility];  Service: Cardiovascular;  Laterality: N/A;  ? ? ?Family History  ?Problem Relation Age of Onset  ? Heart disease Mother   ? Colon cancer Brother   ? Heart disease Brother   ? Esophageal cancer Neg Hx   ? Rectal cancer Neg Hx   ? Stomach cancer Neg Hx   ? ? ?Allergies  ?Allergen Reactions  ? Penicillins Hives  ?  Has patient had a PCN reaction causing immediate rash, facial/tongue/throat swelling, SOB or lightheadedness with hypotension: No ?Has patient had a PCN reaction causing severe rash involving mucus membranes or skin necrosis: No ?Has patient had a PCN reaction that required hospitalization: No ?Has patient had a PCN reaction occurring within the last 10 years: No ?If all of the above answers are "NO", then may proceed with Cephalosporin use. ?  ? ? ?Current Outpatient Medications on File Prior to Visit  ?Medication Sig Dispense Refill  ? ACCU-CHEK GUIDE test strip USE TO CHECK BLOOD SUGAR 3 TIMES A DAY OR AS NEEDED 100 strip 3  ? aspirin EC 325 MG tablet Take 1 tablet (325 mg total) by mouth daily. 30 tablet 0  ? atorvastatin (LIPITOR) 20 MG tablet Take 1 tablet (20 mg total) by mouth daily at 6 PM. 90 tablet 3  ? empagliflozin (JARDIANCE) 10 MG TABS tablet Take 1 tablet (10 mg total) by mouth daily before breakfast. 90 tablet 2  ? glipiZIDE (GLUCOTROL) 10 MG tablet TAKE 1 TABLET BY MOUTH 2 TIMES DAILY BEFORE EVENING MEAL (Patient taking differently: Take 10 mg by mouth daily before breakfast. TAKE 1 TABLET BY MOUTH 2 TIMES DAILY BEFORE EVENING MEAL) 180 tablet 1  ? Lancets (ACCU-CHEK SOFT TOUCH) lancets Used to check blood sugar 3 times daily 100 each 12  ? lisinopril (ZESTRIL) 20 MG tablet Take 1 tablet (20 mg total) by mouth daily. 90 tablet 3  ? metFORMIN (GLUCOPHAGE) 500 MG tablet TAKE 1 TABLET BY MOUTH 2 TIMES  DAILY WITH A MEAL. 180 tablet 1  ? Multiple Vitamin (MULTIVITAMIN) tablet Take 1 tablet by mouth daily.    ? naproxen sodium (ALEVE) 220 MG tablet Take 220 mg by mouth 2 (two) times daily as needed (for knee pain.).    ? OVER THE COUNTER MEDICATION Probiotic extra strength, one capsule daily.    ? OVER THE COUNTER MEDICATION ClearLax, One capful as needed. May take twice a week.    ? ?No current facility-administered medications on file prior to visit.  ? ? ?BP 140/82   Pulse  70   Temp 98.5 ?F (36.9 ?C) (Oral)   Ht 5' 9.75" (1.772 m)   Wt 234 lb (106.1 kg)   SpO2 97%   BMI 33.82 kg/m?  ? ? ?   ?Objective:  ? Physical Exam ?Vitals and nursing note reviewed.  ?Constitutional:   ?   Appearance: Normal appearance.  ?Cardiovascular:  ?   Rate and Rhythm: Normal rate and regular rhythm.  ?   Pulses: Normal pulses.  ?   Heart sounds: Normal heart sounds.  ?Pulmonary:  ?   Effort: Pulmonary effort is normal.  ?   Breath sounds: Normal breath sounds.  ?Neurological:  ?   General: No focal deficit present.  ?   Mental Status: He is alert and oriented to person, place, and time.  ?Psychiatric:     ?   Mood and Affect: Mood normal.     ?   Behavior: Behavior normal.     ?   Thought Content: Thought content normal.     ?   Judgment: Judgment normal.  ? ?   ?Assessment & Plan:  ?1. Controlled type 2 diabetes mellitus with other circulatory complication, without long-term current use of insulin ([place]) ? ?- POC HgB A1c- 6.4  ?- Will have him decrease glipizide to daily dosing  ?- Follow up in 6 months for CPE  ?- Basic Metabolic Panel; Future ? ?2. Essential hypertension ?- Controlled No change in medication  ?- Basic Metabolic Panel; Future ? ? ?Dorothyann Peng, NP ? ?

## 2022-01-28 NOTE — Patient Instructions (Addendum)
Cut back on Glipizide to only taking it at night  ? ?Follow up after November 11th for your physical exam  ? ?Your A1c 6.4  ? ? ?

## 2022-02-03 ENCOUNTER — Other Ambulatory Visit: Payer: Self-pay | Admitting: Adult Health

## 2022-02-03 DIAGNOSIS — E1159 Type 2 diabetes mellitus with other circulatory complications: Secondary | ICD-10-CM

## 2022-02-03 NOTE — Telephone Encounter (Signed)
Last OV: 01/28/22 ? ?1. Controlled type 2 diabetes mellitus with other circulatory complication, without long-term current use of insulin ([place]) ?  ?- POC HgB A1c- 6.4  ?- Will have him decrease glipizide to daily dosing  ?

## 2022-03-01 ENCOUNTER — Ambulatory Visit (INDEPENDENT_AMBULATORY_CARE_PROVIDER_SITE_OTHER): Payer: Medicare PPO

## 2022-03-01 VITALS — Ht 71.0 in | Wt 238.0 lb

## 2022-03-01 DIAGNOSIS — Z0000 Encounter for general adult medical examination without abnormal findings: Secondary | ICD-10-CM | POA: Diagnosis not present

## 2022-03-01 NOTE — Patient Instructions (Signed)
[name] , Thank you for taking time to come for your Medicare Wellness Visit. I appreciate your ongoing commitment to your health goals. Please review the following plan we discussed and let me know if I can assist you in the future.   Screening recommendations/referrals: Colonoscopy: not required Recommended yearly ophthalmology/optometry visit for glaucoma screening and checkup Recommended yearly dental visit for hygiene and checkup  Vaccinations: Influenza vaccine: due 04/20/2022 Pneumococcal vaccine: completed 06/23/2016 Tdap vaccine: completed 10/14/2020, due 10/14/2030 Shingles vaccine: discussed   Covid-19:  02/01/2022, 06/26/2021, 02/03/2021, 06/23/2020, 11/17/2019, 10/27/2019  Advanced directives: Advance directive discussed with you today.   Conditions/risks identified: none  Next appointment: Follow up in one year for your annual wellness visit.   Preventive Care 41 Years and Older, Male Preventive care refers to lifestyle choices and visits with your health care provider that can promote health and wellness. What does preventive care include? A yearly physical exam. This is also called an annual well check. Dental exams once or twice a year. Routine eye exams. Ask your health care provider how often you should have your eyes checked. Personal lifestyle choices, including: Daily care of your teeth and gums. Regular physical activity. Eating a healthy diet. Avoiding tobacco and drug use. Limiting alcohol use. Practicing safe sex. Taking low doses of aspirin every day. Taking vitamin and mineral supplements as recommended by your health care provider. What happens during an annual well check? The services and screenings done by your health care provider during your annual well check will depend on your age, overall health, lifestyle risk factors, and family history of disease. Counseling  Your health care provider may ask you questions about your: Alcohol use. Tobacco  use. Drug use. Emotional well-being. Home and relationship well-being. Sexual activity. Eating habits. History of falls. Memory and ability to understand (cognition). Work and work Statistician. Screening  You may have the following tests or measurements: Height, weight, and BMI. Blood pressure. Lipid and cholesterol levels. These may be checked every 5 years, or more frequently if you are over 61 years old. Skin check. Lung cancer screening. You may have this screening every year starting at age 79 if you have a 30-pack-year history of smoking and currently smoke or have quit within the past 15 years. Fecal occult blood test (FOBT) of the stool. You may have this test every year starting at age 40. Flexible sigmoidoscopy or colonoscopy. You may have a sigmoidoscopy every 5 years or a colonoscopy every 10 years starting at age 64. Prostate cancer screening. Recommendations will vary depending on your family history and other risks. Hepatitis C blood test. Hepatitis B blood test. Sexually transmitted disease (STD) testing. Diabetes screening. This is done by checking your blood sugar (glucose) after you have not eaten for a while (fasting). You may have this done every 1-3 years. Abdominal aortic aneurysm (AAA) screening. You may need this if you are a current or former smoker. Osteoporosis. You may be screened starting at age 69 if you are at high risk. Talk with your health care provider about your test results, treatment options, and if necessary, the need for more tests. Vaccines  Your health care provider may recommend certain vaccines, such as: Influenza vaccine. This is recommended every year. Tetanus, diphtheria, and acellular pertussis (Tdap, Td) vaccine. You may need a Td booster every 10 years. Zoster vaccine. You may need this after age 61. Pneumococcal 13-valent conjugate (PCV13) vaccine. One dose is recommended after age 57. Pneumococcal polysaccharide (PPSV23) vaccine.  One dose is recommended after age 55. Talk to your health care provider about which screenings and vaccines you need and how often you need them. This information is not intended to replace advice given to you by your health care provider. Make sure you discuss any questions you have with your health care provider. Document Released: 10/03/2015 Document Revised: 05/26/2016 Document Reviewed: 07/08/2015 Elsevier Interactive Patient Education  2017 [employer] Prevention in the Home Falls can cause injuries. They can happen to people of all ages. There are many things you can do to make your home safe and to help prevent falls. What can I do on the outside of my home? Regularly fix the edges of walkways and driveways and fix any cracks. Remove anything that might make you trip as you walk through a door, such as a raised step or threshold. Trim any bushes or trees on the path to your home. Use bright outdoor lighting. Clear any walking paths of anything that might make someone trip, such as rocks or tools. Regularly check to see if handrails are loose or broken. Make sure that both sides of any steps have handrails. Any raised decks and porches should have guardrails on the edges. Have any leaves, snow, or ice cleared regularly. Use sand or salt on walking paths during winter. Clean up any spills in your garage right away. This includes oil or grease spills. What can I do in the bathroom? Use night lights. Install grab bars by the toilet and in the tub and shower. Do not use towel bars as grab bars. Use non-skid mats or decals in the tub or shower. If you need to sit down in the shower, use a plastic, non-slip stool. Keep the floor dry. Clean up any water that spills on the floor as soon as it happens. Remove soap buildup in the tub or shower regularly. Attach bath mats securely with double-sided non-slip rug tape. Do not have throw rugs and other things on the floor that can make  you trip. What can I do in the bedroom? Use night lights. Make sure that you have a light by your bed that is easy to reach. Do not use any sheets or blankets that are too big for your bed. They should not hang down onto the floor. Have a firm chair that has side arms. You can use this for support while you get dressed. Do not have throw rugs and other things on the floor that can make you trip. What can I do in the kitchen? Clean up any spills right away. Avoid walking on wet floors. Keep items that you use a lot in easy-to-reach places. If you need to reach something above you, use a strong step stool that has a grab bar. Keep electrical cords out of the way. Do not use floor polish or wax that makes floors slippery. If you must use wax, use non-skid floor wax. Do not have throw rugs and other things on the floor that can make you trip. What can I do with my stairs? Do not leave any items on the stairs. Make sure that there are handrails on both sides of the stairs and use them. Fix handrails that are broken or loose. Make sure that handrails are as long as the stairways. Check any carpeting to make sure that it is firmly attached to the stairs. Fix any carpet that is loose or worn. Avoid having throw rugs at the top or bottom of the  stairs. If you do have throw rugs, attach them to the floor with carpet tape. Make sure that you have a light switch at the top of the stairs and the bottom of the stairs. If you do not have them, ask someone to add them for you. What else can I do to help prevent falls? Wear shoes that: Do not have high heels. Have rubber bottoms. Are comfortable and fit you well. Are closed at the toe. Do not wear sandals. If you use a stepladder: Make sure that it is fully opened. Do not climb a closed stepladder. Make sure that both sides of the stepladder are locked into place. Ask someone to hold it for you, if possible. Clearly mark and make sure that you can  see: Any grab bars or handrails. First and last steps. Where the edge of each step is. Use tools that help you move around (mobility aids) if they are needed. These include: Canes. Walkers. Scooters. Crutches. Turn on the lights when you go into a dark area. Replace any light bulbs as soon as they burn out. Set up your furniture so you have a clear path. Avoid moving your furniture around. If any of your floors are uneven, fix them. If there are any pets around you, be aware of where they are. Review your medicines with your doctor. Some medicines can make you feel dizzy. This can increase your chance of falling. Ask your doctor what other things that you can do to help prevent falls. This information is not intended to replace advice given to you by your health care provider. Make sure you discuss any questions you have with your health care provider. Document Released: 07/03/2009 Document Revised: 02/12/2016 Document Reviewed: 10/11/2014 Elsevier Interactive Patient Education  2017 [employer].

## 2022-03-01 NOTE — Progress Notes (Signed)
I connected with Trevor Wheeler today by telephone and verified that I am speaking with the correct person using two identifiers. Location patient: home Location provider: work Persons participating in the virtual visit: Kaylan Friedmann, Glenna Durand LPN.   I discussed the limitations, risks, security and privacy concerns of performing an evaluation and management service by telephone and the availability of in person appointments. I also discussed with the patient that there may be a patient responsible charge related to this service. The patient expressed understanding and verbally consented to this telephonic visit.    Interactive audio and video telecommunications were attempted between this provider and patient, however failed, due to patient having technical difficulties OR patient did not have access to video capability.  We continued and completed visit with audio only.     Vital signs may be patient reported or missing.  Subjective:   Trevor Wheeler is a 76 y.o. male who presents for Medicare Annual/Subsequent preventive examination.  Review of Systems     Cardiac Risk Factors include: advanced age (>65mn, >>22women);diabetes mellitus;dyslipidemia;hypertension;male gender;obesity (BMI >30kg/m2)     Objective:    Today's Vitals   03/01/22 0813 03/01/22 0814  Weight: 238 lb (108 kg)   Height: '5\' 11"'$  (1.803 m)   PainSc:  7    Body mass index is 33.19 kg/m.     03/01/2022    8:19 AM 02/26/2021    8:24 AM 07/11/2018    8:17 AM 09/28/2017   12:56 PM 07/08/2017    8:30 AM 05/23/2015   11:49 AM 05/05/2015    3:44 PM  Advanced Directives  Does Patient Have a Medical Advance Directive? No Yes No No No No No  Type of ASocial research officer, governmentLiving will       Copy of [place]ugbyin Chart?  No - copy requested       Would patient like information on creating a medical advance directive?    No - Patient declined  Yes - Educational materials  given     Current Medications (verified) Outpatient Encounter Medications as of 03/01/2022  Medication Sig   ACCU-CHEK GUIDE test strip USE TO CHECK BLOOD SUGAR 3 TIMES A DAY OR AS NEEDED   aspirin EC 325 MG tablet Take 1 tablet (325 mg total) by mouth daily.   atorvastatin (LIPITOR) 20 MG tablet Take 1 tablet (20 mg total) by mouth daily at 6 PM.   empagliflozin (JARDIANCE) 10 MG TABS tablet Take 1 tablet (10 mg total) by mouth daily before breakfast.   glipiZIDE (GLUCOTROL) 10 MG tablet Take 1 tablet (10 mg total) by mouth daily before breakfast. TAKE 1 TABLET BY MOUTH DAILY   Lancets (ACCU-CHEK SOFT TOUCH) lancets Used to check blood sugar 3 times daily   lisinopril (ZESTRIL) 20 MG tablet Take 1 tablet (20 mg total) by mouth daily.   metFORMIN (GLUCOPHAGE) 500 MG tablet TAKE 1 TABLET BY MOUTH 2 TIMES DAILY WITH A MEAL.   Multiple Vitamin (MULTIVITAMIN) tablet Take 1 tablet by mouth daily.   naproxen sodium (ALEVE) 220 MG tablet Take 220 mg by mouth 2 (two) times daily as needed (for knee pain.).   OVER THE COUNTER MEDICATION Probiotic extra strength, one capsule daily.   OVER THE COUNTER MEDICATION ClearLax, One capful as needed. May take twice a week.   No facility-administered encounter medications on file as of 03/01/2022.    Allergies (verified) Penicillins   History: Past Medical History:  Diagnosis  Date   Cataract    CVA (cerebral vascular accident) ([place]) 05/04/2015   L cerebellar, "only problem I have is w/balance" 05/05/2015)   Hypertension    Type II diabetes mellitus ([place]) dx'd 05/05/2015   Past Surgical History:  Procedure Laterality Date   Cataract Right 2018   EP IMPLANTABLE DEVICE N/A 05/07/2015   Procedure: Loop Recorder Insertion;  Surgeon: Sanda Klein, MD;  Location: [facility];  Service: Cardiovascular;  Laterality: N/A;   LOOP RECORDER REMOVAL N/A 09/28/2017   Procedure: LOOP RECORDER REMOVAL;  Surgeon: Sanda Klein, MD;  Location: [facility];  Service: Cardiovascular;  Laterality: N/A;   NO PAST SURGERIES     TEE WITHOUT CARDIOVERSION N/A 05/07/2015   Procedure: TRANSESOPHAGEAL ECHOCARDIOGRAM (TEE);  Surgeon: Skeet Latch, MD;  Location: [facility] ENDOS[facility];  Service: Cardiovascular;  Laterality: N/A;   Family History  Problem Relation Age of Onset   Heart disease Mother    Colon cancer Brother    Heart disease Brother    Esophageal cancer Neg Hx    Rectal cancer Neg Hx    Stomach cancer Neg Hx    Social History   Socioeconomic History   Marital status: Married    Spouse name: Not on file   Number of children: Not on file   Years of education: Not on file   Highest education level: GED or equivalent  Occupational History   Not on file  Tobacco Use   Smoking status: Former    Packs/day: 1.00    Years: 20.00    Total pack years: 20.00    Types: Cigarettes    Quit date: 05/21/1974    Years since quitting: 47.8   Smokeless tobacco: Never   Tobacco comments:    quit smoking in the 1970's/ educated [employer]   Substance and Sexual Activity   Alcohol use: No    Alcohol/week: 0.0 standard drinks of alcohol   Drug use: No   Sexual activity: Not Currently  Other Topics Concern   Not on file  Social History Narrative   Employed as a Therapist, music.    Married for 30 years   Has two daughters who live locally.    Social Determinants of Health   Financial Resource Strain: Low Risk  (03/01/2022)   Overall Financial Resource Strain (CARDIA)    Difficulty of Paying Living Expenses: Not hard at all  Food Insecurity: No Food Insecurity (03/01/2022)   Hunger Vital Sign    Worried About Running Out of Food in the Last Year: Never true    Ran Out of Food in the Last Year: Never true  Transportation Needs: No Transportation Needs (03/01/2022)   PRAPARE - Hydrologist (Medical): No    Lack of Transportation (Non-Medical): No  Physical Activity: Sufficiently Active (03/01/2022)   Exercise  Vital Sign    Days of Exercise per Week: 7 days    Minutes of Exercise per Session: 60 min  Stress: No Stress Concern Present (03/01/2022)   [place]    Feeling of Stress : Not at all  Social Connections: [place] (11/02/2021)   Social Connection and Isolation Panel [NHANES]    Frequency of Communication with Friends and Family: More than three times a week    Frequency of Social Gatherings with Friends and Family: Three times a week    Attends Religious Services: 1 to 4 times per year  Active Member of Clubs or Organizations: Yes    Attends Music therapist: More than 4 times per year    Marital Status: Married    Tobacco Counseling Counseling given: Not Answered Tobacco comments: quit smoking in the 1970's/ educated [employer]    Clinical Intake:  Pre-visit preparation completed: Yes  Pain : 0-10 Pain Score: 7  Pain Type: Chronic pain Pain Location: Knee Pain Orientation: Left Pain Descriptors / Indicators: Aching Pain Onset: More than a month ago Pain Frequency: Constant     Nutritional Status: BMI > 30  Obese Nutritional Risks: None Diabetes: Yes  How often do you need to have someone help you when you read instructions, pamphlets, or other written materials from your doctor or pharmacy?: 1 - Never What is the last grade level you completed in school?: 12th grade  Diabetic? Yes Nutrition Risk Assessment:  Has the patient had any N/V/D within the last 2 months?  No  Does the patient have any non-healing wounds?  No  Has the patient had any unintentional weight loss or weight gain?  No   Diabetes:  Is the patient diabetic?  Yes  If diabetic, was a CBG obtained today?  No  Did the patient bring in their glucometer from home?  No  How often do you monitor your CBG's? daily.   Financial Strains and Diabetes Management:  Are you having any financial strains with the device,  your supplies or your medication? No .  Does the patient want to be seen by Chronic Care Management for management of their diabetes?  No  Would the patient like to be referred to a Nutritionist or for Diabetic Management?  No   Diabetic Exams:  Diabetic Eye Exam: Completed 11/17/2021 Diabetic Foot Exam: Completed 07/31/2021   Interpreter Needed?: No  Information entered by :: NAllen LPN   Activities of Daily Living    03/01/2022    8:21 AM  In your present state of health, do you have any difficulty performing the following activities:  Hearing? 0  Vision? 0  Difficulty concentrating or making decisions? 0  Walking or climbing stairs? 0  Dressing or bathing? 0  Doing errands, shopping? 0  Preparing Food and eating ? N  Using the Toilet? N  In the past six months, have you accidently leaked urine? Y  Do you have problems with loss of bowel control? N  Managing your Medications? N  Managing your Finances? N  Housekeeping or managing your Housekeeping? N    Patient Care Team: Dorothyann Peng, NP as PCP - General (Family Medicine) Croitoru, Dani Gobble, MD as Consulting Physician (Cardiology)  Indicate any recent Medical Services you may have received from other than [facility] providers in the past year (date may be approximate).     Assessment:   This is a routine wellness examination for [place].  Hearing/Vision screen Vision Screening - Comments:: Regular eye exams, [facility]  Dietary issues and exercise activities discussed: Current Exercise Habits: Home exercise routine, Type of exercise: walking, Frequency (Times/Week): 7   Goals Addressed             This Visit's Progress    Patient Stated       03/01/2022, wants to get down to 200 pounds       Depression Screen    03/01/2022    8:21 AM 01/28/2022    7:01 AM 02/26/2021    8:25 AM 02/26/2021    8:21 AM 07/30/2020    6:58 AM  07/11/2018    8:18 AM 07/04/2018    7:20 AM  PHQ 2/9 Scores  PHQ - 2 Score 0 0 0 0  0 0 0  PHQ- 9 Score  0   0      Fall Risk    03/01/2022    8:21 AM 01/28/2022    7:03 AM 11/02/2021    9:57 AM 02/26/2021    8:25 AM 07/30/2020    6:58 AM  Fall Risk   Falls in the past year? 0 0 0 0 0  Number falls in past yr: 0 0  0 0  Injury with Fall? 0 0  0 0  Risk for fall due to : Medication side effect No Fall Risks  No Fall Risks   Follow up Falls evaluation completed;Education provided;Falls prevention discussed Falls evaluation completed       FALL RISK PREVENTION PERTAINING TO THE HOME:  Any stairs in or around the home? Yes  If so, are there any without handrails? No  Home free of loose throw rugs in walkways, pet beds, electrical cords, etc? Yes  Adequate lighting in your home to reduce risk of falls? Yes   ASSISTIVE DEVICES UTILIZED TO PREVENT FALLS:  Life alert? No  Use of a cane, walker or w/c? No  Grab bars in the bathroom? No  Shower chair or bench in shower? No  Elevated toilet seat or a handicapped toilet? No   TIMED UP AND GO:      Cognitive Function:        03/01/2022    8:23 AM  6CIT Screen  What Year? 0 points  What month? 0 points  What time? 0 points  Count back from 20 0 points  Months in reverse 2 points  Repeat phrase 2 points  Total Score 4 points    Immunizations Immunization History  Administered Date(s) Administered   Fluad Quad(high Dose 65+) 05/15/2019   Influenza, High Dose Seasonal PF 06/19/2015, 06/23/2016, 05/19/2017, 05/03/2018, 06/23/2020, 06/26/2021   Influenza-Unspecified 05/21/2017   PFIZER Comirnaty(Gray Top)Covid-19 Tri-Sucrose Vaccine 02/03/2021   PFIZER(Purple Top)SARS-COV-2 Vaccination 10/27/2019, 11/17/2019, 06/23/2020   Pfizer Covid-19 Vaccine Bivalent Booster 44yr & up 06/26/2021   Pneumococcal Conjugate-13 06/23/2016   Pneumococcal Polysaccharide-23 05/06/2015   Td 10/14/2020    TDAP status: Up to date  Flu Vaccine status: Up to date  Pneumococcal vaccine status: Up to date  Covid-19 vaccine  status: Completed vaccines  Qualifies for Shingles Vaccine? Yes   Zostavax completed No   Shingrix Completed?: No.    Education has been provided regarding the importance of this vaccine. Patient has been advised to call insurance company to determine out of pocket expense if they have not yet received this vaccine. Advised may also receive vaccine at local pharmacy or Health Dept. Verbalized acceptance and understanding.  Screening Tests Health Maintenance  Topic Date Due   Zoster Vaccines- Shingrix (1 of 2) 07/21/2022 (Originally 02/07/1997)   INFLUENZA VACCINE  04/20/2022   FOOT EXAM  07/31/2022   HEMOGLOBIN A1C  07/31/2022   OPHTHALMOLOGY EXAM  11/17/2022   TETANUS/TDAP  10/14/2030   Pneumonia Vaccine 75 Years old  Completed   COVID-19 Vaccine  Completed   Hepatitis C Screening  Completed   HPV VACCINES  Aged Out    Health Maintenance  There are no preventive care reminders to display for this patient.  Colorectal cancer screening: No longer required.   Lung Cancer Screening: (Low Dose CT Chest recommended if Age 75-80years, 357  pack-year currently smoking OR have quit w/in 15years.) does not qualify.   Lung Cancer Screening Referral: no  Additional Screening:  Hepatitis C Screening: does qualify; Completed 06/28/2017  Vision Screening: Recommended annual ophthalmology exams for early detection of glaucoma and other disorders of the eye. Is the patient up to date with their annual eye exam?  Yes  Who is the provider or what is the name of the office in which the patient attends annual eye exams? [facility] If pt is not established with a provider, would they like to be referred to a provider to establish care? No .   Dental Screening: Recommended annual dental exams for proper oral hygiene  Community Resource Referral / Chronic Care Management: CRR required this visit?  No   CCM required this visit?  No      Plan:     I have personally reviewed and noted  the following in the patient's chart:   Medical and social history Use of alcohol, tobacco or illicit drugs  Current medications and supplements including opioid prescriptions. Patient is not currently taking opioid prescriptions. Functional ability and status Nutritional status Physical activity Advanced directives List of other physicians Hospitalizations, surgeries, and ER visits in previous 12 months Vitals Screenings to include cognitive, depression, and falls Referrals and appointments  In addition, I have reviewed and discussed with patient certain preventive protocols, quality metrics, and best practice recommendations. A written personalized care plan for preventive services as well as general preventive health recommendations were provided to patient.     Kellie Simmering, LPN   6/76/1949   Nurse Notes: none  Due to this being a virtual visit, the after visit summary with patients personalized plan was offered to patient via mail or my-chart. Patient would like to access on my-chart

## 2022-04-21 ENCOUNTER — Other Ambulatory Visit: Payer: Self-pay | Admitting: Adult Health

## 2022-05-05 ENCOUNTER — Ambulatory Visit: Payer: Medicare PPO | Admitting: Adult Health

## 2022-07-13 ENCOUNTER — Other Ambulatory Visit: Payer: Self-pay | Admitting: Adult Health

## 2022-07-13 DIAGNOSIS — I10 Essential (primary) hypertension: Secondary | ICD-10-CM

## 2022-07-13 DIAGNOSIS — E782 Mixed hyperlipidemia: Secondary | ICD-10-CM

## 2022-07-18 ENCOUNTER — Other Ambulatory Visit: Payer: Self-pay | Admitting: Adult Health

## 2022-07-18 DIAGNOSIS — E1159 Type 2 diabetes mellitus with other circulatory complications: Secondary | ICD-10-CM

## 2022-07-28 ENCOUNTER — Other Ambulatory Visit: Payer: Self-pay | Admitting: Adult Health

## 2022-07-28 DIAGNOSIS — E1159 Type 2 diabetes mellitus with other circulatory complications: Secondary | ICD-10-CM

## 2022-08-05 ENCOUNTER — Ambulatory Visit (INDEPENDENT_AMBULATORY_CARE_PROVIDER_SITE_OTHER): Payer: Medicare PPO | Admitting: Adult Health

## 2022-08-05 VITALS — BP 120/80 | HR 73 | Temp 97.7°F | Ht 69.0 in | Wt 230.0 lb

## 2022-08-05 DIAGNOSIS — Z125 Encounter for screening for malignant neoplasm of prostate: Secondary | ICD-10-CM

## 2022-08-05 DIAGNOSIS — E1159 Type 2 diabetes mellitus with other circulatory complications: Secondary | ICD-10-CM

## 2022-08-05 DIAGNOSIS — I10 Essential (primary) hypertension: Secondary | ICD-10-CM

## 2022-08-05 DIAGNOSIS — E782 Mixed hyperlipidemia: Secondary | ICD-10-CM

## 2022-08-05 DIAGNOSIS — Z0000 Encounter for general adult medical examination without abnormal findings: Secondary | ICD-10-CM | POA: Diagnosis not present

## 2022-08-05 LAB — LIPID PANEL
Cholesterol: 150 mg/dL (ref 0–200)
HDL: 34.6 mg/dL — ABNORMAL LOW (ref >39.00)
LDL Cholesterol: 91 mg/dL (ref 0–99)
NonHDL: 115.71
Total CHOL/HDL Ratio: 4
Triglycerides: 123 mg/dL (ref 0.0–149.0)
VLDL: 24.6 mg/dL (ref 0.0–40.0)

## 2022-08-05 LAB — CBC WITH DIFFERENTIAL/PLATELET
Basophils Absolute: 0.1 10*3/uL (ref 0.0–0.1)
Basophils Relative: 0.7 % (ref 0.0–3.0)
Eosinophils Absolute: 0.2 10*3/uL (ref 0.0–0.7)
Eosinophils Relative: 2.5 % (ref 0.0–5.0)
HCT: 47.6 % (ref 39.0–52.0)
Hemoglobin: 15.4 g/dL (ref 13.0–17.0)
Lymphocytes Relative: 33.4 % (ref 12.0–46.0)
Lymphs Abs: 2.5 10*3/uL (ref 0.7–4.0)
MCHC: 32.4 g/dL (ref 30.0–36.0)
MCV: 85.1 fl (ref 78.0–100.0)
Monocytes Absolute: 0.6 10*3/uL (ref 0.1–1.0)
Monocytes Relative: 8.4 % (ref 3.0–12.0)
Neutro Abs: 4.1 10*3/uL (ref 1.4–7.7)
Neutrophils Relative %: 55 % (ref 43.0–77.0)
Platelets: 198 10*3/uL (ref 150.0–400.0)
RBC: 5.59 Mil/uL (ref 4.22–5.81)
RDW: 14.1 % (ref 11.5–15.5)
WBC: 7.5 10*3/uL (ref 4.0–10.5)

## 2022-08-05 LAB — COMPREHENSIVE METABOLIC PANEL
ALT: 25 U/L (ref 0–53)
AST: 25 U/L (ref 0–37)
Albumin: 4.4 g/dL (ref 3.5–5.2)
Alkaline Phosphatase: 103 U/L (ref 39–117)
BUN: 13 mg/dL (ref 6–23)
CO2: 27 mEq/L (ref 19–32)
Calcium: 9.4 mg/dL (ref 8.4–10.5)
Chloride: 105 mEq/L (ref 96–112)
Creatinine, Ser: 0.89 mg/dL (ref 0.40–1.50)
GFR: 83.84 mL/min (ref >60.00)
Glucose, Bld: 141 mg/dL — ABNORMAL HIGH (ref 70–99)
Potassium: 4.3 mEq/L (ref 3.5–5.1)
Sodium: 139 mEq/L (ref 135–145)
Total Bilirubin: 0.8 mg/dL (ref 0.2–1.2)
Total Protein: 6.9 g/dL (ref 6.0–8.3)

## 2022-08-05 LAB — MICROALBUMIN / CREATININE URINE RATIO
Creatinine,U: 120.1 mg/dL
Microalb Creat Ratio: 0.6 mg/g (ref 0.0–30.0)
Microalb, Ur: <0.7 mg/dL (ref 0.0–1.9)

## 2022-08-05 LAB — TSH: TSH: 0.86 u[IU]/mL (ref 0.35–5.50)

## 2022-08-05 LAB — HEMOGLOBIN A1C: Hgb A1c MFr Bld: 7.1 % — ABNORMAL HIGH (ref 4.6–6.5)

## 2022-08-05 LAB — PSA: PSA: 1.2 ng/mL (ref 0.10–4.00)

## 2022-08-05 NOTE — Progress Notes (Signed)
Subjective:    Patient ID: Trevor Wheeler, male    DOB: Sep 19, 1946, 76 y.o.   MRN: [id number]  HPI Patient presents for yearly preventative medicine examination. He is a pleasant 76 year old male who  has a past medical history of Cataract, CVA (cerebral vascular accident) ([place]) (05/04/2015), Hypertension, and Type II diabetes mellitus ([place]) (dx'd 05/05/2015).  Diabetes mellitus type 2-maintained on metformin 500 mg twice daily as well as glipizide 10 mg twice daily and Jardiance 10 mg.  He periodically monitors his blood sugar at home and reports readings in the 90's - 140.   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  He denies episodes of hypoglycemia Lab Results  Component Value Date   HGBA1C 6.4 01/28/2022   Hypertension-managed with lisinopril 20 mg daily.  He has not experienced any shortness of breath, dizziness, lightheadedness, chest pain, or shortness of breath BP Readings from Last 3 Encounters:  08/05/22 120/80  01/28/22 140/82  11/03/21 126/82   Hyperlipidemia-managed with Lipitor 20 mg.  He has had excellent control of his lipids in the past.  He denies myalgia or fatigue Lab Results  Component Value Date   CHOL 140 07/31/2021   HDL 34.70 (L) 07/31/2021   LDLCALC 79 07/31/2021   LDLDIRECT 182.2 08/05/2010   TRIG 132.0 07/31/2021   CHOLHDL 4 07/31/2021   History of cryptogenic stroke-currently on aspirin 325 mg daily.  He did have a loop recorder placed but this did not show any atrial fibrillation thankfully was removed in 2019.  He has not had any neurologic or cardiac events since  All immunizations and health maintenance protocols were reviewed with the patient and needed orders were placed.  Appropriate screening laboratory values were ordered for the patient including screening of hyperlipidemia, renal function and hepatic function. If indicated by BPH, a PSA was ordered.  Medication reconciliation,  past  medical history, social history, problem list and allergies were reviewed in detail with the patient  Goals were established with regard to weight loss, exercise, and  diet in compliance with medications. Wt Readings from Last 3 Encounters:  08/05/22 230 lb (104.3 kg)  03/01/22 238 lb (108 kg)  01/28/22 234 lb (106.1 kg)   Review of Systems  Constitutional: Negative.   HENT: Negative.    Eyes: Negative.   Respiratory: Negative.    Cardiovascular: Negative.   Gastrointestinal: Negative.   Endocrine: Negative.   Genitourinary: Negative.   Musculoskeletal: Negative.   Skin: Negative.   Allergic/Immunologic: Negative.   Neurological: Negative.   Hematological: Negative.   Psychiatric/Behavioral: Negative.    All other systems reviewed and are negative.  Past Medical History:  Diagnosis Date   Cataract    CVA (cerebral vascular accident) ([place]) 05/04/2015   L cerebellar, "only problem I have is w/balance" 05/05/2015)   Hypertension    Type II diabetes mellitus ([place]) dx'd 05/05/2015    Social History   Socioeconomic History   Marital status: Married    Spouse name: Not on file   Number of children: Not on file   Years of education: Not on file   Highest education level: GED or equivalent  Occupational History   Not on file  Tobacco Use   Smoking status: Former    Packs/day: 1.00    Years: 20.00    Total pack years: 20.00    Types: Cigarettes    Quit date: 05/21/1974    Years  since quitting: 48.2   Smokeless tobacco: Never   Tobacco comments:    quit smoking in the 1970's/ educated [employer]   Substance and Sexual Activity   Alcohol use: No    Alcohol/week: 0.0 standard drinks of alcohol   Drug use: No   Sexual activity: Not Currently  Other Topics Concern   Not on file  Social History Narrative   Employed as a Therapist, music.    Married for 30 years   Has two daughters who live locally.    Social Determinants of Health   Financial Resource Strain: Low Risk   (03/01/2022)   Overall Financial Resource Strain (CARDIA)    Difficulty of Paying Living Expenses: Not hard at all  Food Insecurity: No Food Insecurity (03/01/2022)   Hunger Vital Sign    Worried About Running Out of Food in the Last Year: Never true    Ran Out of Food in the Last Year: Never true  Transportation Needs: No Transportation Needs (03/01/2022)   PRAPARE - Hydrologist (Medical): No    Lack of Transportation (Non-Medical): No  Physical Activity: Sufficiently Active (03/01/2022)   Exercise Vital Sign    Days of Exercise per Week: 7 days    Minutes of Exercise per Session: 60 min  Stress: No Stress Concern Present (03/01/2022)   [place]    Feeling of Stress : Not at all  Social Connections: [place] (11/02/2021)   Social Connection and Isolation Panel [NHANES]    Frequency of Communication with Friends and Family: More than three times a week    Frequency of Social Gatherings with Friends and Family: Three times a week    Attends Religious Services: 1 to 4 times per year    Active Member of Clubs or Organizations: Yes    Attends Archivist Meetings: More than 4 times per year    Marital Status: Married  Human resources officer Violence: Not At Risk (02/26/2021)   Humiliation, Afraid, Rape, and Kick questionnaire    Fear of Current or Ex-Partner: No    Emotionally Abused: No    Physically Abused: No    Sexually Abused: No    Past Surgical History:  Procedure Laterality Date   Cataract Right 2018   EP IMPLANTABLE DEVICE N/A 05/07/2015   Procedure: Loop Recorder Insertion;  Surgeon: Sanda Klein, MD;  Location: [facility];  Service: Cardiovascular;  Laterality: N/A;   LOOP RECORDER REMOVAL N/A 09/28/2017   Procedure: LOOP RECORDER REMOVAL;  Surgeon: Sanda Klein, MD;  Location: [facility];  Service: Cardiovascular;  Laterality: N/A;   NO PAST  SURGERIES     TEE WITHOUT CARDIOVERSION N/A 05/07/2015   Procedure: TRANSESOPHAGEAL ECHOCARDIOGRAM (TEE);  Surgeon: Skeet Latch, MD;  Location: [facility];  Service: Cardiovascular;  Laterality: N/A;    Family History  Problem Relation Age of Onset   Heart disease Mother    Colon cancer Brother    Heart disease Brother    Esophageal cancer Neg Hx    Rectal cancer Neg Hx    Stomach cancer Neg Hx     Allergies  Allergen Reactions   Penicillins Hives    Has patient had a PCN reaction causing immediate rash, facial/tongue/throat swelling, SOB or lightheadedness with hypotension: No Has patient had a PCN reaction causing severe rash involving mucus membranes or skin necrosis: No Has patient had a PCN reaction that required hospitalization: No Has  patient had a PCN reaction occurring within the last 10 years: No If all of the above answers are "NO", then may proceed with Cephalosporin use.     Current Outpatient Medications on File Prior to Visit  Medication Sig Dispense Refill   ACCU-CHEK GUIDE test strip USE TO CHECK BLOOD SUGAR 3 TIMES A DAY OR AS NEEDED 100 strip 3   aspirin EC 325 MG tablet Take 1 tablet (325 mg total) by mouth daily. 30 tablet 0   atorvastatin (LIPITOR) 20 MG tablet TAKE 1 TABLET BY MOUTH DAILY AT 6 PM. 90 tablet 3   empagliflozin (JARDIANCE) 10 MG TABS tablet TAKE 1 TABLET BY MOUTH DAILY BEFORE BREAKFAST. 90 tablet 1   glipiZIDE (GLUCOTROL) 10 MG tablet TAKE 1 TABLET (10 MG TOTAL) BY MOUTH DAILY BEFORE BREAKFAST. TAKE 1 TABLET BY MOUTH DAILY 90 tablet 1   Lancets (ACCU-CHEK SOFT TOUCH) lancets Used to check blood sugar 3 times daily 100 each 12   lisinopril (ZESTRIL) 20 MG tablet TAKE 1 TABLET BY MOUTH EVERY DAY 90 tablet 3   metFORMIN (GLUCOPHAGE) 500 MG tablet TAKE 1 TABLET BY MOUTH TWICE A DAY WITH MEALS 180 tablet 1   Misc Natural Products ([employer]S PROSTATE MAX + PO) Take by mouth.     Multiple Vitamin (MULTIVITAMIN) tablet Take 1 tablet by mouth daily.      naproxen sodium (ALEVE) 220 MG tablet Take 220 mg by mouth 2 (two) times daily as needed (for knee pain.).     OVER THE COUNTER MEDICATION Probiotic extra strength, one capsule daily.     OVER THE COUNTER MEDICATION ClearLax, One capful as needed. May take twice a week.     No current facility-administered medications on file prior to visit.    BP 120/80   Pulse 73   Temp 97.7 F (36.5 C) (Oral)   Ht '5\' 9"'$  (1.753 m)   Wt 230 lb (104.3 kg)   SpO2 99%   BMI 33.97 kg/m       Objective:   Physical Exam Vitals and nursing note reviewed.  Constitutional:      General: He is not in acute distress.    Appearance: Normal appearance. He is well-developed and normal weight.  HENT:     Head: Normocephalic and atraumatic.     Right Ear: Tympanic membrane, ear canal and external ear normal. There is no impacted cerumen.     Left Ear: Tympanic membrane, ear canal and external ear normal. There is no impacted cerumen.     Nose: Nose normal. No congestion or rhinorrhea.     Mouth/Throat:     Mouth: Mucous membranes are moist.     Pharynx: Oropharynx is clear. No oropharyngeal exudate or posterior oropharyngeal erythema.  Eyes:     General:        Right eye: No discharge.        Left eye: No discharge.     Extraocular Movements: Extraocular movements intact.     Conjunctiva/sclera: Conjunctivae normal.     Pupils: Pupils are equal, round, and reactive to light.  Neck:     Vascular: No carotid bruit.     Trachea: No tracheal deviation.  Cardiovascular:     Rate and Rhythm: Normal rate and regular rhythm.     Pulses: Normal pulses.     Heart sounds: Normal heart sounds. No murmur heard.    No friction rub. No gallop.  Pulmonary:     Effort: Pulmonary effort is normal. No respiratory  distress.     Breath sounds: Normal breath sounds. No stridor. No wheezing, rhonchi or rales.  Chest:     Chest wall: No tenderness.  Abdominal:     General: Bowel sounds are normal. There is no  distension.     Palpations: Abdomen is soft. There is no mass.     Tenderness: There is no abdominal tenderness. There is no right CVA tenderness, left CVA tenderness, guarding or rebound.     Hernia: No hernia is present.  Musculoskeletal:        General: No swelling, tenderness, deformity or signs of injury. Normal range of motion.     Right lower leg: No edema.     Left lower leg: No edema.  Lymphadenopathy:     Cervical: No cervical adenopathy.  Skin:    General: Skin is warm and dry.     Capillary Refill: Capillary refill takes less than 2 seconds.     Coloration: Skin is not jaundiced or pale.     Findings: No bruising, erythema, lesion or rash.  Neurological:     General: No focal deficit present.     Mental Status: He is alert and oriented to person, place, and time.     Cranial Nerves: No cranial nerve deficit.     Sensory: No sensory deficit.     Motor: No weakness.     Coordination: Coordination normal.     Gait: Gait normal.     Deep Tendon Reflexes: Reflexes normal.  Psychiatric:        Mood and Affect: Mood normal.        Behavior: Behavior normal.        Thought Content: Thought content normal.        Judgment: Judgment normal.       Assessment & Plan:  1. Routine general medical examination at a health care facility - Continue to work on weight loss - he has lost 8 pounds since his last visit  - Follow up in one year or sooner if needed - CBC with Differential/Platelet; Future - Comprehensive metabolic panel; Future - Hemoglobin A1c; Future - Lipid panel; Future - TSH; Future  2. Controlled type 2 diabetes mellitus with other circulatory complication, without long-term current use of insulin ([place]) - Consider increase in Jardiance  - Likely six month follow up  - CBC with Differential/Platelet; Future - Comprehensive metabolic panel; Future - Hemoglobin A1c; Future - Lipid panel; Future - TSH; Future - Microalbumin/Creatinine Ratio, Urine; Future  3.  Essential hypertension - well controlled. No change in medications  - CBC with Differential/Platelet; Future - Comprehensive metabolic panel; Future - Hemoglobin A1c; Future - Lipid panel; Future - TSH; Future - Microalbumin/Creatinine Ratio, Urine; Future  4. Mixed hyperlipidemia - Consider increase in statin  - CBC with Differential/Platelet; Future - Comprehensive metabolic panel; Future - Hemoglobin A1c; Future - Lipid panel; Future - TSH; Future  5. Prostate cancer screening  - PSA; Future  Dorothyann Peng, NP

## 2022-08-05 NOTE — Patient Instructions (Addendum)
It was great seeing you today   We will follow up with you regarding your lab work   Please let me know if you need anything   

## 2022-10-11 ENCOUNTER — Other Ambulatory Visit: Payer: Self-pay | Admitting: Adult Health

## 2022-11-23 DIAGNOSIS — E119 Type 2 diabetes mellitus without complications: Secondary | ICD-10-CM | POA: Diagnosis not present

## 2022-11-23 LAB — HM DIABETES EYE EXAM

## 2023-01-12 ENCOUNTER — Other Ambulatory Visit: Payer: Self-pay | Admitting: Adult Health

## 2023-01-12 DIAGNOSIS — E1159 Type 2 diabetes mellitus with other circulatory complications: Secondary | ICD-10-CM

## 2023-01-20 ENCOUNTER — Other Ambulatory Visit: Payer: Self-pay | Admitting: Adult Health

## 2023-01-20 DIAGNOSIS — E1159 Type 2 diabetes mellitus with other circulatory complications: Secondary | ICD-10-CM

## 2023-02-03 ENCOUNTER — Ambulatory Visit: Payer: Medicare PPO | Admitting: Adult Health

## 2023-02-03 VITALS — BP 130/70 | HR 77 | Temp 98.4°F | Ht 69.0 in | Wt 228.0 lb

## 2023-02-03 DIAGNOSIS — Z7984 Long term (current) use of oral hypoglycemic drugs: Secondary | ICD-10-CM | POA: Diagnosis not present

## 2023-02-03 DIAGNOSIS — I10 Essential (primary) hypertension: Secondary | ICD-10-CM

## 2023-02-03 DIAGNOSIS — E1159 Type 2 diabetes mellitus with other circulatory complications: Secondary | ICD-10-CM

## 2023-02-03 DIAGNOSIS — E119 Type 2 diabetes mellitus without complications: Secondary | ICD-10-CM | POA: Diagnosis not present

## 2023-02-03 LAB — POCT GLYCOSYLATED HEMOGLOBIN (HGB A1C): Hemoglobin A1C: 6.6 % — AB (ref 4.0–5.6)

## 2023-02-03 MED ORDER — EMPAGLIFLOZIN 10 MG PO TABS: ORAL_TABLET | ORAL | 1 refills | Status: DC

## 2023-02-03 NOTE — Patient Instructions (Signed)
Health Maintenance Due  Topic Date Due   Zoster Vaccines- Shingrix (1 of 2) Never done   Medicare Annual Wellness (AWV)  03/02/2023      Row Labels 03/01/2022    8:21 AM 01/28/2022    7:01 AM 02/26/2021    8:25 AM  Depression screen PHQ 2/9   Section Header. No data exists in this row.     Decreased Interest   0 0   Down, Depressed, Hopeless   0 0 0  PHQ - 2 Score   0 0 0  Altered sleeping    0   Tired, decreased energy    0   Change in appetite    0   Feeling bad or failure about yourself     0   Trouble concentrating    0   Moving slowly or fidgety/restless    0   Suicidal thoughts    0   PHQ-9 Score    0   Difficult doing work/chores    Not difficult at all

## 2023-02-03 NOTE — Progress Notes (Signed)
Subjective:    Patient ID: Trevor Wheeler, male    DOB: 30-Nov-1945, 77 y.o.   MRN: [id number]  HPI  77 year old male who  has a past medical history of Cataract, CVA (cerebral vascular accident) ([facility]) (05/04/2015), Hypertension, and Type II diabetes mellitus ([facility]) (dx'd 05/05/2015).  He presents to the office today for follow up regarding DM and HTN   Diabetes mellitus type 2-maintained on metformin 500 mg twice daily as well as glipizide 10 mg twice daily and Jardiance 10 mg.  He periodically monitors his blood sugar at home and reports readings in the 90's - 140.   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  He denies episodes of hypoglycemia Lab Results  Component Value Date   HGBA1C 6.6 (A) 02/03/2023   Hypertension-managed with lisinopril 20 mg daily.  He has not experienced any shortness of breath, dizziness, lightheadedness, chest pain, or shortness of breath BP Readings from Last 3 Encounters:  02/03/23 130/70  08/05/22 120/80  01/28/22 140/82   Review of Systems See HPI   Past Medical History:  Diagnosis Date   Cataract    CVA (cerebral vascular accident) ([facility]) 05/04/2015   L cerebellar, "only problem I have is w/balance" 05/05/2015)   Hypertension    Type II diabetes mellitus ([facility]) dx'd 05/05/2015    Social History   Socioeconomic History   Marital status: Married    Spouse name: Not on file   Number of children: Not on file   Years of education: Not on file   Highest education level: GED or equivalent  Occupational History   Not on file  Tobacco Use   Smoking status: Former    Packs/day: 1.00    Years: 20.00    Additional pack years: 0.00    Total pack years: 20.00    Types: Cigarettes    Quit date: 05/21/1974    Years since quitting: 48.7   Smokeless tobacco: Never   Tobacco comments:    quit smoking in the 1970's/ educated [employer]   Substance and Sexual Activity   Alcohol use: No    Alcohol/week: 0.0  standard drinks of alcohol   Drug use: No   Sexual activity: Not Currently  Other Topics Concern   Not on file  Social History Narrative   Employed as a Teaching laboratory technician.    Married for 30 years   Has two daughters who live locally.    Social Determinants of Health   Financial Resource Strain: Low Risk  (02/03/2023)   Overall Financial Resource Strain (CARDIA)    Difficulty of Paying Living Expenses: Not hard at all  Food Insecurity: No Food Insecurity (02/03/2023)   Hunger Vital Sign    Worried About Running Out of Food in the Last Year: Never true    Ran Out of Food in the Last Year: Never true  Transportation Needs: No Transportation Needs (02/03/2023)   PRAPARE - Administrator, Civil Service (Medical): No    Lack of Transportation (Non-Medical): No  Physical Activity: Sufficiently Active (02/03/2023)   Exercise Vital Sign    Days of Exercise per Week: 7 days    Minutes of Exercise per Session: 60 min  Stress: No Stress Concern Present (02/03/2023)   [employer] of Occupational Health - Occupational Stress Questionnaire    Feeling of Stress : Not at all  Social Connections: Unknown (02/03/2023)   Social Connection and Isolation  Panel [NHANES]    Frequency of Communication with Friends and Family: More than three times a week    Frequency of Social Gatherings with Friends and Family: More than three times a week    Attends Religious Services: Patient declined    Active Member of Clubs or Organizations: Yes    Attends Banker Meetings: More than 4 times per year    Marital Status: Married  Catering manager Violence: Not At Risk (02/26/2021)   Humiliation, Afraid, Rape, and Kick questionnaire    Fear of Current or Ex-Partner: No    Emotionally Abused: No    Physically Abused: No    Sexually Abused: No    Past Surgical History:  Procedure Laterality Date   Cataract Right 2018   EP IMPLANTABLE DEVICE N/A 05/07/2015   Procedure: Loop Recorder  Insertion;  Surgeon: Thurmon Fair, MD;  Location: [facility];  Service: Cardiovascular;  Laterality: N/A;   LOOP RECORDER REMOVAL N/A 09/28/2017   Procedure: LOOP RECORDER REMOVAL;  Surgeon: Thurmon Fair, MD;  Location: [facility];  Service: Cardiovascular;  Laterality: N/A;   NO PAST SURGERIES     TEE WITHOUT CARDIOVERSION N/A 05/07/2015   Procedure: TRANSESOPHAGEAL ECHOCARDIOGRAM (TEE);  Surgeon: Chilton Si, MD;  Location: [facility] END[facility];  Service: Cardiovascular;  Laterality: N/A;    Family History  Problem Relation Age of Onset   Heart disease Mother    Colon cancer Brother    Heart disease Brother    Esophageal cancer Neg Hx    Rectal cancer Neg Hx    Stomach cancer Neg Hx     Allergies  Allergen Reactions   Penicillins Hives    Has patient had a PCN reaction causing immediate rash, facial/tongue/throat swelling, SOB or lightheadedness with hypotension: No Has patient had a PCN reaction causing severe rash involving mucus membranes or skin necrosis: No Has patient had a PCN reaction that required hospitalization: No Has patient had a PCN reaction occurring within the last 10 years: No If all of the above answers are "NO", then may proceed with Cephalosporin use.     Current Outpatient Medications on File Prior to Visit  Medication Sig Dispense Refill   ACCU-CHEK GUIDE test strip USE TO CHECK BLOOD SUGAR 3 TIMES A DAY OR AS NEEDED 100 strip 3   aspirin EC 325 MG tablet Take 1 tablet (325 mg total) by mouth daily. 30 tablet 0   atorvastatin (LIPITOR) 20 MG tablet TAKE 1 TABLET BY MOUTH DAILY AT 6 PM. 90 tablet 3   glipiZIDE (GLUCOTROL) 10 MG tablet TAKE 1 TABLET (10 MG TOTAL) BY MOUTH DAILY BEFORE BREAKFAST. TAKE 1 TABLET BY MOUTH DAILY 90 tablet 1   JARDIANCE 10 MG TABS tablet TAKE 1 TABLET BY MOUTH EVERY DAY BEFORE BREAKFAST 90 tablet 1   Lancets (ACCU-CHEK SOFT TOUCH) lancets Used to check blood sugar 3 times daily 100 each 12   lisinopril (ZESTRIL) 20  MG tablet TAKE 1 TABLET BY MOUTH EVERY DAY 90 tablet 3   metFORMIN (GLUCOPHAGE) 500 MG tablet TAKE 1 TABLET BY MOUTH TWICE A DAY WITH FOOD 180 tablet 1   Misc Natural Products (CVS PROSTATE MAX + PO) Take by mouth.     Multiple Vitamin (MULTIVITAMIN) tablet Take 1 tablet by mouth daily.     naproxen sodium (ALEVE) 220 MG tablet Take 220 mg by mouth 2 (two) times daily as needed (for knee pain.).     OVER THE COUNTER MEDICATION Probiotic extra  strength, one capsule daily.     OVER THE COUNTER MEDICATION ClearLax, One capful as needed. May take twice a week.     No current facility-administered medications on file prior to visit.    BP 130/70   Pulse 77   Temp 98.4 F (36.9 C) (Oral)   Ht 5\' 9"  (1.753 m)   Wt 228 lb (103.4 kg)   SpO2 95%   BMI 33.67 kg/m       Objective:   Physical Exam Vitals and nursing note reviewed.  Constitutional:      Appearance: Normal appearance. He is obese.  Cardiovascular:     Rate and Rhythm: Normal rate and regular rhythm.     Pulses: Normal pulses.     Heart sounds: Normal heart sounds.  Pulmonary:     Effort: Pulmonary effort is normal.     Breath sounds: Normal breath sounds.  Skin:    General: Skin is warm and dry.  Neurological:     General: No focal deficit present.     Mental Status: He is alert and oriented to person, place, and time.  Psychiatric:        Mood and Affect: Mood normal.        Behavior: Behavior normal.        Thought Content: Thought content normal.        Judgment: Judgment normal.        Assessment & Plan:  1. Controlled type 2 diabetes mellitus with other circulatory complication, without long-term current use of insulin ([facility])  - POC HgB A1c- 6.6. - has improved and at goal - No change in medication therapy  - Follow up in 6 months for CPE or sooner if needed  2. Essential hypertension - Controlled. No change in medication   Shirline Frees, NP

## 2023-03-07 ENCOUNTER — Ambulatory Visit (INDEPENDENT_AMBULATORY_CARE_PROVIDER_SITE_OTHER): Payer: Medicare PPO

## 2023-03-07 VITALS — Ht 69.0 in | Wt 228.0 lb

## 2023-03-07 DIAGNOSIS — Z0000 Encounter for general adult medical examination without abnormal findings: Secondary | ICD-10-CM | POA: Diagnosis not present

## 2023-03-07 NOTE — Patient Instructions (Addendum)
Trevor Wheeler , Thank you for taking time to come for your Medicare Wellness Visit. I appreciate your ongoing commitment to your health goals. Please review the following plan we discussed and let me know if I can assist you in the future.   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      Weight (lb) < 205 lb (93 kg)      Start going to the gym- x2 per week Cut back on fried foods to 2 times a week         This is a list of the screening recommended for you and due dates:  Health Maintenance  Topic Date Due   Zoster (Shingles) Vaccine (1 of 2) 06/07/2023*   Flu Shot  04/21/2023   Yearly kidney function blood test for diabetes  08/06/2023   Yearly kidney health urinalysis for diabetes  08/06/2023   Complete foot exam   08/06/2023   Hemoglobin A1C  08/06/2023   Eye exam for diabetics  11/23/2023   Medicare Annual Wellness Visit  03/06/2024   DTaP/Tdap/Td vaccine (2 - Tdap) 10/14/2030   Pneumonia Vaccine  Completed   COVID-19 Vaccine  Completed   Hepatitis C Screening  Completed   HPV Vaccine  Aged Out  *Topic was postponed. The date shown is not the original due date.    Advanced directives: Advance directive discussed with you today. Even though you declined this today, please call our office should you change your mind, and we can give you the proper paperwork for you to fill out.   Conditions/risks identified: None  Next appointment: Follow up in one year for your annual wellness visit.   Preventive Care 76 Years and Older, Male  Preventive care refers to lifestyle choices and visits with your health care provider that can promote health and wellness. What does preventive care include? A yearly physical exam. This is also called an annual well check. Dental exams once or twice a year. Routine eye  exams. Ask your health care provider how often you should have your eyes checked. Personal lifestyle choices, including: Daily care of your teeth and gums. Regular physical activity. Eating a healthy diet. Avoiding tobacco and drug use. Limiting alcohol use. Practicing safe sex. Taking low doses of aspirin every day. Taking vitamin and mineral supplements as recommended by your health care provider. What happens during an annual well check? The services and screenings done by your health care provider during your annual well check will depend on your age, overall health, lifestyle risk factors, and family history of disease. Counseling  Your health care provider may ask you questions about your: Alcohol use. Tobacco use. Drug use. Emotional well-being. Home and relationship well-being. Sexual activity. Eating habits. History of falls. Memory and ability to understand (cognition). Work and work Astronomer. Screening  You may have the following tests or measurements: Height, weight, and BMI. Blood pressure. Lipid and cholesterol levels. These may be checked every 5 years, or more frequently if you are over 76 years old. Skin check. Lung cancer screening. You may have this screening every year starting at age 76 if you have a 30-pack-year history of smoking and currently smoke or have  quit within the past 15 years. Fecal occult blood test (FOBT) of the stool. You may have this test every year starting at age 76. Flexible sigmoidoscopy or colonoscopy. You may have a sigmoidoscopy every 5 years or a colonoscopy every 10 years starting at age 76. Prostate cancer screening. Recommendations will vary depending on your family history and other risks. Hepatitis C blood test. Hepatitis B blood test. Sexually transmitted disease (STD) testing. Diabetes screening. This is done by checking your blood sugar (glucose) after you have not eaten for a while (fasting). You may have this done every  1-3 years. Abdominal aortic aneurysm (AAA) screening. You may need this if you are a current or former smoker. Osteoporosis. You may be screened starting at age 76 if you are at high risk. Talk with your health care provider about your test results, treatment options, and if necessary, the need for more tests. Vaccines  Your health care provider may recommend certain vaccines, such as: Influenza vaccine. This is recommended every year. Tetanus, diphtheria, and acellular pertussis (Tdap, Td) vaccine. You may need a Td booster every 10 years. Zoster vaccine. You may need this after age 76. Pneumococcal 13-valent conjugate (PCV13) vaccine. One dose is recommended after age 76. Pneumococcal polysaccharide (PPSV23) vaccine. One dose is recommended after age 76. Talk to your health care provider about which screenings and vaccines you need and how often you need them. This information is not intended to replace advice given to you by your health care provider. Make sure you discuss any questions you have with your health care provider. Document Released: 10/03/2015 Document Revised: 05/26/2016 Document Reviewed: 07/08/2015 Elsevier Interactive Patient Education  2017 [employer].  Fall Prevention in the Home Falls can cause injuries. They can happen to people of all ages. There are many things you can do to make your home safe and to help prevent falls. What can I do on the outside of my home? Regularly fix the edges of walkways and driveways and fix any cracks. Remove anything that might make you trip as you walk through a door, such as a raised step or threshold. Trim any bushes or trees on the path to your home. Use bright outdoor lighting. Clear any walking paths of anything that might make someone trip, such as rocks or tools. Regularly check to see if handrails are loose or broken. Make sure that both sides of any steps have handrails. Any raised decks and porches should have guardrails on  the edges. Have any leaves, snow, or ice cleared regularly. Use sand or salt on walking paths during winter. Clean up any spills in your garage right away. This includes oil or grease spills. What can I do in the bathroom? Use night lights. Install grab bars by the toilet and in the tub and shower. Do not use towel bars as grab bars. Use non-skid mats or decals in the tub or shower. If you need to sit down in the shower, use a plastic, non-slip stool. Keep the floor dry. Clean up any water that spills on the floor as soon as it happens. Remove soap buildup in the tub or shower regularly. Attach bath mats securely with double-sided non-slip rug tape. Do not have throw rugs and other things on the floor that can make you trip. What can I do in the bedroom? Use night lights. Make sure that you have a light by your bed that is easy to reach. Do not use any sheets or blankets that are  too big for your bed. They should not hang down onto the floor. Have a firm chair that has side arms. You can use this for support while you get dressed. Do not have throw rugs and other things on the floor that can make you trip. What can I do in the kitchen? Clean up any spills right away. Avoid walking on wet floors. Keep items that you use a lot in easy-to-reach places. If you need to reach something above you, use a strong step stool that has a grab bar. Keep electrical cords out of the way. Do not use floor polish or wax that makes floors slippery. If you must use wax, use non-skid floor wax. Do not have throw rugs and other things on the floor that can make you trip. What can I do with my stairs? Do not leave any items on the stairs. Make sure that there are handrails on both sides of the stairs and use them. Fix handrails that are broken or loose. Make sure that handrails are as long as the stairways. Check any carpeting to make sure that it is firmly attached to the stairs. Fix any carpet that is loose  or worn. Avoid having throw rugs at the top or bottom of the stairs. If you do have throw rugs, attach them to the floor with carpet tape. Make sure that you have a light switch at the top of the stairs and the bottom of the stairs. If you do not have them, ask someone to add them for you. What else can I do to help prevent falls? Wear shoes that: Do not have high heels. Have rubber bottoms. Are comfortable and fit you well. Are closed at the toe. Do not wear sandals. If you use a stepladder: Make sure that it is fully opened. Do not climb a closed stepladder. Make sure that both sides of the stepladder are locked into place. Ask someone to hold it for you, if possible. Clearly mark and make sure that you can see: Any grab bars or handrails. First and last steps. Where the edge of each step is. Use tools that help you move around (mobility aids) if they are needed. These include: Canes. Walkers. Scooters. Crutches. Turn on the lights when you go into a dark area. Replace any light bulbs as soon as they burn out. Set up your furniture so you have a clear path. Avoid moving your furniture around. If any of your floors are uneven, fix them. If there are any pets around you, be aware of where they are. Review your medicines with your doctor. Some medicines can make you feel dizzy. This can increase your chance of falling. Ask your doctor what other things that you can do to help prevent falls. This information is not intended to replace advice given to you by your health care provider. Make sure you discuss any questions you have with your health care provider. Document Released: 07/03/2009 Document Revised: 02/12/2016 Document Reviewed: 10/11/2014 Elsevier Interactive Patient Education  2017 [employer].

## 2023-03-07 NOTE — Progress Notes (Cosign Needed Addendum)
Subjective:   Trevor Wheeler is a 77 y.o. male who presents for Medicare Annual/Subsequent preventive examination.  Review of Systems    Virtual Visit via Telephone Note  I connected with  Trevor Wheeler on 03/07/23 at 10:00 AM EDT by telephone and verified that I am speaking with the correct person using two identifiers.  Location: Patient: Home Provider: Office Persons participating in the virtual visit: patient/Nurse Health Advisor   I discussed the limitations, risks, security and privacy concerns of performing an evaluation and management service by telephone and the availability of in person appointments. The patient expressed understanding and agreed to proceed.  Interactive audio and video telecommunications were attempted between this nurse and patient, however failed, due to patient having technical difficulties OR patient did not have access to video capability.  We continued and completed visit with audio only.  Some vital signs may be absent or patient reported.   [name], LPN  Cardiac Risk Factors include: advanced age (>5men, >54 women);male gender;diabetes mellitus     Objective:    Today's Vitals   03/07/23 1027  Weight: 228 lb (103.4 kg)  Height: 5\' 9"  (1.753 m)   Body mass index is 33.67 kg/m.     03/07/2023   10:38 AM 03/01/2022    8:19 AM 02/26/2021    8:24 AM 07/11/2018    8:17 AM 09/28/2017   12:56 PM 07/08/2017    8:30 AM 05/23/2015   11:49 AM  Advanced Directives  Does Patient Have a Medical Advance Directive? No No Yes No No No No  Type of Surveyor, minerals;Living will      Copy of Healthcare Power of Attorney in Chart?   No - copy requested      Would patient like information on creating a medical advance directive? No - Patient declined    No - Patient declined  Yes - Educational materials given    Current Medications (verified) Outpatient Encounter Medications as of 03/07/2023  Medication Sig    ACCU-CHEK GUIDE test strip USE TO CHECK BLOOD SUGAR 3 TIMES A DAY OR AS NEEDED   aspirin EC 325 MG tablet Take 1 tablet (325 mg total) by mouth daily.   atorvastatin (LIPITOR) 20 MG tablet TAKE 1 TABLET BY MOUTH DAILY AT 6 PM.   empagliflozin (JARDIANCE) 10 MG TABS tablet TAKE 1 TABLET BY MOUTH EVERY DAY BEFORE BREAKFAST   glipiZIDE (GLUCOTROL) 10 MG tablet TAKE 1 TABLET (10 MG TOTAL) BY MOUTH DAILY BEFORE BREAKFAST. TAKE 1 TABLET BY MOUTH DAILY   Lancets (ACCU-CHEK SOFT TOUCH) lancets Used to check blood sugar 3 times daily   lisinopril (ZESTRIL) 20 MG tablet TAKE 1 TABLET BY MOUTH EVERY DAY   metFORMIN (GLUCOPHAGE) 500 MG tablet TAKE 1 TABLET BY MOUTH TWICE A DAY WITH FOOD   Misc Natural Products ([employer] PROSTATE MAX + PO) Take by mouth.   Multiple Vitamin (MULTIVITAMIN) tablet Take 1 tablet by mouth daily.   naproxen sodium (ALEVE) 220 MG tablet Take 220 mg by mouth 2 (two) times daily as needed (for knee pain.).   OVER THE COUNTER MEDICATION Probiotic extra strength, one capsule daily.   OVER THE COUNTER MEDICATION ClearLax, One capful as needed. May take twice a week.   No facility-administered encounter medications on file as of 03/07/2023.    Allergies (verified) Penicillins   History: Past Medical History:  Diagnosis Date   Cataract    CVA (cerebral vascular accident) (HCC)  05/04/2015   L cerebellar, "only problem I have is w/balance" 05/05/2015)   Hypertension    Type II diabetes mellitus (HCC) dx'd 05/05/2015   Past Surgical History:  Procedure Laterality Date   Cataract Right 2018   EP IMPLANTABLE DEVICE N/A 05/07/2015   Procedure: Loop Recorder Insertion;  Surgeon: Thurmon Fair, MD;  Location: [facility] INVASIVE CV [facility];  Service: Cardiovascular;  Laterality: N/A;   LOOP RECORDER REMOVAL N/A 09/28/2017   Procedure: LOOP RECORDER REMOVAL;  Surgeon: Thurmon Fair, MD;  Location: [facility]AS[facility] CV [facility];  Service: Cardiovascular;  Laterality: N/A;   NO PAST SURGERIES     TEE WITHOUT  CARDIOVERSION N/A 05/07/2015   Procedure: TRANSESOPHAGEAL ECHOCARDIOGRAM (TEE);  Surgeon: Chilton Si, MD;  Location: [facility];  Service: Cardiovascular;  Laterality: N/A;   Family History  Problem Relation Age of Onset   Heart disease Mother    Colon cancer Brother    Heart disease Brother    Esophageal cancer Neg Hx    Rectal cancer Neg Hx    Stomach cancer Neg Hx    Social History   Socioeconomic History   Marital status: Married    Spouse name: Not on file   Number of children: Not on file   Years of education: Not on file   Highest education level: GED or equivalent  Occupational History   Not on file  Tobacco Use   Smoking status: Former    Packs/day: 1.00    Years: 20.00    Additional pack years: 0.00    Total pack years: 20.00    Types: Cigarettes    Quit date: 05/21/1974    Years since quitting: 48.8   Smokeless tobacco: Never   Tobacco comments:    quit smoking in the 1970's/ educated [employer]   Substance and Sexual Activity   Alcohol use: No    Alcohol/week: 0.0 standard drinks of alcohol   Drug use: No   Sexual activity: Not Currently  Other Topics Concern   Not on file  Social History Narrative   Employed as a Teaching laboratory technician.    Married for 30 years   Has two daughters who live locally.    Social Determinants of Health   Financial Resource Strain: Low Risk  (03/07/2023)   Overall Financial Resource Strain (CARDIA)    Difficulty of Paying Living Expenses: Not hard at all  Food Insecurity: No Food Insecurity (03/07/2023)   Hunger Vital Sign    Worried About Running Out of Food in the Last Year: Never true    Ran Out of Food in the Last Year: Never true  Transportation Needs: No Transportation Needs (03/07/2023)   PRAPARE - Administrator, Civil Service (Medical): No    Lack of Transportation (Non-Medical): No  Physical Activity: Sufficiently Active (03/07/2023)   Exercise Vital Sign    Days of Exercise per Week: 7 days     Minutes of Exercise per Session: 120 min  Stress: No Stress Concern Present (03/07/2023)   [employer] of Occupational Health - Occupational Stress Questionnaire    Feeling of Stress : Not at all  Social Connections: Socially Integrated (03/07/2023)   Social Connection and Isolation Panel [NHANES]    Frequency of Communication with Friends and Family: More than three times a week    Frequency of Social Gatherings with Friends and Family: More than three times a week    Attends Religious Services: More than 4 times per year    Active Member of  Clubs or Organizations: Yes    Attends Engineer, structural: More than 4 times per year    Marital Status: Married    Tobacco Counseling Counseling given: Not Answered Tobacco comments: quit smoking in the 1970's/ educated [employer]    Clinical Intake:  Pre-visit preparation completed: Yes  Pain : No/denies pain   Nutrition Risk Assessment:  Has the patient had any N/V/D within the last 2 months?  No  Does the patient have any non-healing wounds?  No  Has the patient had any unintentional weight loss or weight gain?  No   Diabetes:  Is the patient diabetic?  Yes  If diabetic, was a CBG obtained today?  Yes CBG 140 Taken by patient Did the patient bring in their glucometer from home?  No  How often do you monitor your CBG's? Daily.   Financial Strains and Diabetes Management:  Are you having any financial strains with the device, your supplies or your medication? No .  Does the patient want to be seen by Chronic Care Management for management of their diabetes?  No  Would the patient like to be referred to a Nutritionist or for Diabetic Management?  No   Diabetic Exams:  Diabetic Eye Exam: Completed . Overdue for diabetic eye exam. Pt has been advised about the importance in completing this exam. A referral has been placed today. Message sent to referral coordinator for scheduling purposes. Advised pt to expect a call from  office referred to regarding appt.  Diabetic Foot Exam: Completed . Pt has been advised about the importance in completing this exam. Pt is scheduled for diabetic foot exam on Followed by PCP.    BMI - recorded: 33.67 Nutritional Risks: None Diabetes: Yes CBG done?: Yes (CBG 140 Taken by patient) CBG resulted in Enter/ Edit results?: Yes Did pt. bring in CBG monitor from home?: No  How often do you need to have someone help you when you read instructions, pamphlets, or other written materials from your doctor or pharmacy?: 1 - Never  Diabetic?  Yes  Interpreter Needed?: No  Information entered by :: Theresa Mulligan LPN   Activities of Daily Living    03/07/2023   10:37 AM 03/07/2023   10:36 AM  In your present state of health, do you have any difficulty performing the following activities:  Hearing? 0 0  Vision? 0 0  Difficulty concentrating or making decisions? 0 0  Walking or climbing stairs? 0 0  Dressing or bathing? 0 0  Doing errands, shopping? 0 0  Preparing Food and eating ? N N  Using the Toilet? N N  In the past six months, have you accidently leaked urine? N N  Do you have problems with loss of bowel control? N N  Managing your Medications? N N  Managing your Finances? N N  Housekeeping or managing your Housekeeping? N N    Patient Care Team: Shirline Frees, NP as PCP - General (Family Medicine) Croitoru, Rachelle Hora, MD as Consulting Physician (Cardiology)  Indicate any recent Medical Services you may have received from other than [facility] providers in the past year (date may be approximate).     Assessment:   This is a routine wellness examination for [place].  Hearing/Vision screen Hearing Screening - Comments:: Denies hearing difficulties   Vision Screening - Comments:: Wears rx glasses - up to date with routine eye exams with  Maggie Schwalbe  Dietary issues and exercise activities discussed: Current Exercise Habits: Home exercise routine, Type  of exercise:  walking, Time (Minutes): > 60, Frequency (Times/Week): 7, Weekly Exercise (Minutes/Week): 0, Intensity: Moderate, Exercise limited by: None identified   Goals Addressed               This Visit's Progress     Patient stated (pt-stated)        I want to get better at fling my airplane.       Depression Screen    03/07/2023   10:34 AM 03/01/2022    8:21 AM 01/28/2022    7:01 AM 02/26/2021    8:25 AM 02/26/2021    8:21 AM 07/30/2020    6:58 AM 07/11/2018    8:18 AM  PHQ 2/9 Scores  PHQ - 2 Score 0 0 0 0 0 0 0  PHQ- 9 Score   0   0     Fall Risk    03/07/2023   10:38 AM 03/07/2023    7:07 AM 02/03/2023    6:45 AM 03/01/2022    8:21 AM 01/28/2022    7:03 AM  Fall Risk   Falls in the past year? 0 0 0 0 0  Number falls in past yr: 0   0 0  Injury with Fall? 0   0 0  Risk for fall due to : No Fall Risks   Medication side effect No Fall Risks  Follow up Falls prevention discussed   Falls evaluation completed;Education provided;Falls prevention discussed Falls evaluation completed    FALL RISK PREVENTION PERTAINING TO THE HOME:  Any stairs in or around the home? Yes  If so, are there any without handrails? No  Home free of loose throw rugs in walkways, pet beds, electrical cords, etc? Yes  Adequate lighting in your home to reduce risk of falls? Yes   ASSISTIVE DEVICES UTILIZED TO PREVENT FALLS:  Life alert? No  Use of a cane, walker or w/c? No  Grab bars in the bathroom? No  Shower chair or bench in shower? No  Elevated toilet seat or a handicapped toilet? No   TIMED UP AND GO:  Was the test performed? No . Audio visit   Cognitive Function:        03/07/2023   10:39 AM 03/01/2022    8:23 AM  6CIT Screen  What Year? 0 points 0 points  What month? 0 points 0 points  What time? 0 points 0 points  Count back from 20 0 points 0 points  Months in reverse 0 points 2 points  Repeat phrase 0 points 2 points  Total Score 0 points 4 points     Immunizations Immunization History  Administered Date(s) Administered   COVID-19, mRNA, vaccine(Comirnaty)12 years and older 06/29/2022   Fluad Quad(high Dose 65+) 05/15/2019, 06/28/2022   Influenza, High Dose Seasonal PF 06/19/2015, 06/23/2016, 05/19/2017, 05/03/2018, 06/23/2020, 06/26/2021   Influenza-Unspecified 05/21/2017   PFIZER Comirnaty(Gray Top)Covid-19 Tri-Sucrose Vaccine 02/03/2021   PFIZER(Purple Top)SARS-COV-2 Vaccination 10/27/2019, 11/17/2019, 06/23/2020   PNEUMOCOCCAL CONJUGATE-20 02/01/2022   Pfizer Covid-19 Vaccine Bivalent Booster 19yrs & up 06/26/2021, 02/01/2022   Pneumococcal Conjugate-13 06/23/2016   Pneumococcal Polysaccharide-23 05/06/2015   Td 10/14/2020    TDAP status: Up to date  Flu Vaccine status: Up to date  Pneumococcal vaccine status: Up to date  Covid-19 vaccine status: Completed vaccines  Qualifies for Shingles Vaccine? Yes   Zostavax completed No   Shingrix Completed?: No.    Education has been provided regarding the importance of this vaccine. Patient has been advised to call insurance  company to determine out of pocket expense if they have not yet received this vaccine. Advised may also receive vaccine at local pharmacy or Health Dept. Verbalized acceptance and understanding.  Screening Tests Health Maintenance  Topic Date Due   Zoster Vaccines- Shingrix (1 of 2) 06/07/2023 (Originally [birth year]/1997)   INFLUENZA VACCINE  04/21/2023   Diabetic kidney evaluation - eGFR measurement  08/06/2023   Diabetic kidney evaluation - Urine ACR  08/06/2023   FOOT EXAM  08/06/2023   HEMOGLOBIN A1C  08/06/2023   OPHTHALMOLOGY EXAM  11/23/2023   Medicare Annual Wellness (AWV)  03/06/2024   DTaP/Tdap/Td (2 - Tdap) 10/14/2030   Pneumonia Vaccine 4+ Years old  Completed   COVID-19 Vaccine  Completed   Hepatitis C Screening  Completed   HPV VACCINES  Aged Out    Health Maintenance  There are no preventive care reminders to display for this  patient.   Colorectal cancer screening: No longer required.   Lung Cancer Screening: (Low Dose CT Chest recommended if Age 49-80 years, 30 pack-year currently smoking OR have quit w/in 15years.) does not qualify.     Additional Screening:  Hepatitis C Screening: does qualify; Completed 06/28/17  Vision Screening: Recommended annual ophthalmology exams for early detection of glaucoma and other disorders of the eye. Is the patient up to date with their annual eye exam?  Yes  Who is the provider or what is the name of the office in which the patient attends annual eye exams? [facility] If pt is not established with a provider, would they like to be referred to a provider to establish care? No .   Dental Screening: Recommended annual dental exams for proper oral hygiene  Community Resource Referral / Chronic Care Management:  CRR required this visit?  No   CCM required this visit?  No      Plan:     I have personally reviewed and noted the following in the patient's chart:   Medical and social history Use of alcohol, tobacco or illicit drugs  Current medications and supplements including opioid prescriptions. Patient is not currently taking opioid prescriptions. Functional ability and status Nutritional status Physical activity Advanced directives List of other physicians Hospitalizations, surgeries, and ER visits in previous 12 months Vitals Screenings to include cognitive, depression, and falls Referrals and appointments  In addition, I have reviewed and discussed with patient certain preventive protocols, quality metrics, and best practice recommendations. A written personalized care plan for preventive services as well as general preventive health recommendations were provided to patient.     [name], LPN   [id number]/61/0960   Nurse Notes:   None

## 2023-07-02 ENCOUNTER — Other Ambulatory Visit: Payer: Self-pay | Admitting: Adult Health

## 2023-07-02 DIAGNOSIS — E782 Mixed hyperlipidemia: Secondary | ICD-10-CM

## 2023-07-02 DIAGNOSIS — I10 Essential (primary) hypertension: Secondary | ICD-10-CM

## 2023-07-03 ENCOUNTER — Other Ambulatory Visit: Payer: Self-pay | Admitting: Adult Health

## 2023-07-13 ENCOUNTER — Other Ambulatory Visit: Payer: Self-pay | Admitting: Adult Health

## 2023-07-13 DIAGNOSIS — E1159 Type 2 diabetes mellitus with other circulatory complications: Secondary | ICD-10-CM

## 2023-08-09 ENCOUNTER — Ambulatory Visit (INDEPENDENT_AMBULATORY_CARE_PROVIDER_SITE_OTHER): Payer: Medicare PPO | Admitting: Adult Health

## 2023-08-09 ENCOUNTER — Encounter: Payer: Self-pay | Admitting: Adult Health

## 2023-08-09 VITALS — BP 138/88 | HR 68 | Temp 98.2°F | Ht 69.5 in | Wt 243.0 lb

## 2023-08-09 DIAGNOSIS — Z7984 Long term (current) use of oral hypoglycemic drugs: Secondary | ICD-10-CM

## 2023-08-09 DIAGNOSIS — E782 Mixed hyperlipidemia: Secondary | ICD-10-CM | POA: Diagnosis not present

## 2023-08-09 DIAGNOSIS — Z125 Encounter for screening for malignant neoplasm of prostate: Secondary | ICD-10-CM

## 2023-08-09 DIAGNOSIS — E119 Type 2 diabetes mellitus without complications: Secondary | ICD-10-CM

## 2023-08-09 DIAGNOSIS — I10 Essential (primary) hypertension: Secondary | ICD-10-CM | POA: Diagnosis not present

## 2023-08-09 DIAGNOSIS — Z0000 Encounter for general adult medical examination without abnormal findings: Secondary | ICD-10-CM | POA: Diagnosis not present

## 2023-08-09 LAB — CBC
HCT: 47.6 % (ref 39.0–52.0)
Hemoglobin: 15.1 g/dL (ref 13.0–17.0)
MCHC: 31.7 g/dL (ref 30.0–36.0)
MCV: 86.6 fL (ref 78.0–100.0)
Platelets: 190 10*3/uL (ref 150.0–400.0)
RBC: 5.5 Mil/uL (ref 4.22–5.81)
RDW: 14.2 % (ref 11.5–15.5)
WBC: 7.7 10*3/uL (ref 4.0–10.5)

## 2023-08-09 LAB — COMPREHENSIVE METABOLIC PANEL
ALT: 21 U/L (ref 0–53)
AST: 23 U/L (ref 0–37)
Albumin: 4.3 g/dL (ref 3.5–5.2)
Alkaline Phosphatase: 94 U/L (ref 39–117)
BUN: 11 mg/dL (ref 6–23)
CO2: 28 meq/L (ref 19–32)
Calcium: 9.4 mg/dL (ref 8.4–10.5)
Chloride: 104 meq/L (ref 96–112)
Creatinine, Ser: 0.9 mg/dL (ref 0.40–1.50)
GFR: 82.97 mL/min (ref >60.00)
Glucose, Bld: 127 mg/dL — ABNORMAL HIGH (ref 70–99)
Potassium: 4.2 meq/L (ref 3.5–5.1)
Sodium: 142 meq/L (ref 135–145)
Total Bilirubin: 0.9 mg/dL (ref 0.2–1.2)
Total Protein: 6.6 g/dL (ref 6.0–8.3)

## 2023-08-09 LAB — LIPID PANEL
Cholesterol: 145 mg/dL (ref 0–200)
HDL: 32 mg/dL — ABNORMAL LOW (ref >39.00)
LDL Cholesterol: 86 mg/dL (ref 0–99)
NonHDL: 113.2
Total CHOL/HDL Ratio: 5
Triglycerides: 138 mg/dL (ref 0.0–149.0)
VLDL: 27.6 mg/dL (ref 0.0–40.0)

## 2023-08-09 LAB — HEMOGLOBIN A1C: Hgb A1c MFr Bld: 7 % — ABNORMAL HIGH (ref 4.6–6.5)

## 2023-08-09 LAB — MICROALBUMIN / CREATININE URINE RATIO
Creatinine,U: 115.1 mg/dL
Microalb Creat Ratio: 0.8 mg/g (ref 0.0–30.0)
Microalb, Ur: 0.9 mg/dL (ref 0.0–1.9)

## 2023-08-09 LAB — PSA: PSA: 1.39 ng/mL (ref 0.10–4.00)

## 2023-08-09 LAB — TSH: TSH: 0.84 u[IU]/mL (ref 0.35–5.50)

## 2023-08-09 NOTE — Patient Instructions (Signed)
It was great seeing you today   We will follow up with you regarding your lab work   Please let me know if you need anything   

## 2023-08-09 NOTE — Progress Notes (Signed)
Subjective:    Patient ID: Trevor Wheeler, male    DOB: [birth year], 77 y.o.   MRN: [id number]  HPI Patient presents for yearly preventative medicine examination. He is a pleasant 77 year old male who  has a past medical history of Cataract, CVA (cerebral vascular accident) (HCC) (05/04/2015), Hypertension, and Type II diabetes mellitus (HCC) (dx'd 05/05/2015).  Diabetes mellitus type 2-maintained on metformin 500 mg twice daily as well as glipizide 10 mg twice daily and Jardiance 10 mg.  He periodically monitors his blood sugar at home and reports readings in the 90's - 140.   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  He denies episodes of hypoglycemia Lab Results  Component Value Date   HGBA1C 6.6 (A) 02/03/2023   HGBA1C 7.1 (H) 08/05/2022   HGBA1C 6.4 01/28/2022   Hypertension-managed with lisinopril 20 mg daily.  He has not experienced any shortness of breath, dizziness, lightheadedness, chest pain, or shortness of breath BP Readings from Last 3 Encounters:  08/09/23 (!) 160/80  02/03/23 130/70  08/05/22 120/80   Hyperlipidemia-managed with Lipitor 20 mg.  He has had excellent control of his lipids in the past.  He denies myalgia or fatigue Lab Results  Component Value Date   CHOL 150 08/05/2022   HDL 34.60 (L) 08/05/2022   LDLCALC 91 08/05/2022   LDLDIRECT 182.2 08/05/2010   TRIG 123.0 08/05/2022   CHOLHDL 4 08/05/2022    History of cryptogenic stroke-currently on aspirin 325 mg daily.  He did have a loop recorder placed but this did not show any atrial fibrillation thankfully was removed in 2019.  He has not had any neurologic or cardiac events since   All immunizations and health maintenance protocols were reviewed with the patient and needed orders were placed.  Appropriate screening laboratory values were ordered for the patient including screening of hyperlipidemia, renal function and hepatic function. If indicated  by BPH, a PSA was ordered.  Medication reconciliation,  past medical history, social history, problem list and allergies were reviewed in detail with the patient  Goals were established with regard to weight loss, exercise, and  diet in compliance with medications  Wt Readings from Last 3 Encounters:  08/09/23 243 lb (110.2 kg)  03/07/23 228 lb (103.4 kg)  02/03/23 228 lb (103.4 kg)   Review of Systems  Constitutional: Negative.   HENT: Negative.    Eyes: Negative.   Respiratory: Negative.    Cardiovascular: Negative.   Gastrointestinal: Negative.   Endocrine: Negative.   Genitourinary: Negative.   Musculoskeletal: Negative.   Skin: Negative.   Allergic/Immunologic: Negative.   Neurological: Negative.   Hematological: Negative.   Psychiatric/Behavioral: Negative.    All other systems reviewed and are negative.  Past Medical History:  Diagnosis Date   Cataract    CVA (cerebral vascular accident) (HCC) 05/04/2015   L cerebellar, "only problem I have is w/balance" 05/05/2015)   Hypertension    Type II diabetes mellitus (HCC) dx'd 05/05/2015    Social History   Socioeconomic History   Marital status: Married    Spouse name: Not on file   Number of children: Not on file   Years of education: Not on file   Highest education level: GED or equivalent  Occupational History   Not on file  Tobacco Use   Smoking status: Former    Current packs/day: 0.00    Average packs/day: 1 pack/day for 20.0 years (  20.0 ttl pk-yrs)    Types: Cigarettes    Start date: 05/21/1954    Quit date: 05/21/1974    Years since quitting: 49.2   Smokeless tobacco: Never   Tobacco comments:    quit smoking in the 1970's/ educated [employer]   Substance and Sexual Activity   Alcohol use: No    Alcohol/week: 0.0 standard drinks of alcohol   Drug use: No   Sexual activity: Not Currently  Other Topics Concern   Not on file  Social History Narrative   Employed as a Teaching laboratory technician.    Married for 30  years   Has two daughters who live locally.    Social Determinants of Health   Financial Resource Strain: Low Risk  (03/07/2023)   Overall Financial Resource Strain (CARDIA)    Difficulty of Paying Living Expenses: Not hard at all  Food Insecurity: No Food Insecurity (03/07/2023)   Hunger Vital Sign    Worried About Running Out of Food in the Last Year: Never true    Ran Out of Food in the Last Year: Never true  Transportation Needs: No Transportation Needs (03/07/2023)   PRAPARE - Administrator, Civil Service (Medical): No    Lack of Transportation (Non-Medical): No  Physical Activity: Sufficiently Active (03/07/2023)   Exercise Vital Sign    Days of Exercise per Week: 7 days    Minutes of Exercise per Session: 120 min  Stress: No Stress Concern Present (03/07/2023)   [employer] of Occupational Health - Occupational Stress Questionnaire    Feeling of Stress : Not at all  Social Connections: Socially Integrated (03/07/2023)   Social Connection and Isolation Panel [NHANES]    Frequency of Communication with Friends and Family: More than three times a week    Frequency of Social Gatherings with Friends and Family: More than three times a week    Attends Religious Services: More than 4 times per year    Active Member of Clubs or Organizations: Yes    Attends Banker Meetings: More than 4 times per year    Marital Status: Married  Catering manager Violence: Not At Risk (03/07/2023)   Humiliation, Afraid, Rape, and Kick questionnaire    Fear of Current or Ex-Partner: No    Emotionally Abused: No    Physically Abused: No    Sexually Abused: No    Past Surgical History:  Procedure Laterality Date   Cataract Right 2018   EP IMPLANTABLE DEVICE N/A 05/07/2015   Procedure: Loop Recorder Insertion;  Surgeon: Thurmon Fair, MD;  Location: [facility];  Service: Cardiovascular;  Laterality: N/A;   LOOP RECORDER REMOVAL N/A 09/28/2017   Procedure: LOOP  RECORDER REMOVAL;  Surgeon: Thurmon Fair, MD;  Location: [facility];  Service: Cardiovascular;  Laterality: N/A;   NO PAST SURGERIES     TEE WITHOUT CARDIOVERSION N/A 05/07/2015   Procedure: TRANSESOPHAGEAL ECHOCARDIOGRAM (TEE);  Surgeon: Chilton Si, MD;  Location: [facility] ENDOSCOPY;  Service: Cardiovascular;  Laterality: N/A;    Family History  Problem Relation Age of Onset   Heart disease Mother    Colon cancer Brother    Heart disease Brother    Esophageal cancer Neg Hx    Rectal cancer Neg Hx    Stomach cancer Neg Hx     Allergies  Allergen Reactions   Penicillins Hives    Has patient had a PCN reaction causing immediate rash, facial/tongue/throat swelling, SOB or lightheadedness with hypotension: No Has  patient had a PCN reaction causing severe rash involving mucus membranes or skin necrosis: No Has patient had a PCN reaction that required hospitalization: No Has patient had a PCN reaction occurring within the last 10 years: No If all of the above answers are "NO", then may proceed with Cephalosporin use.     Current Outpatient Medications on File Prior to Visit  Medication Sig Dispense Refill   ACCU-CHEK GUIDE test strip USE TO CHECK BLOOD SUGAR 3 TIMES A DAY OR AS NEEDED 100 strip 3   aspirin EC 325 MG tablet Take 1 tablet (325 mg total) by mouth daily. 30 tablet 0   atorvastatin (LIPITOR) 20 MG tablet TAKE 1 TABLET BY MOUTH DAILY AT 6PM 90 tablet 3   glipiZIDE (GLUCOTROL) 10 MG tablet TAKE 1 TABLET (10 MG TOTAL) BY MOUTH DAILY BEFORE BREAKFAST. 90 tablet 1   JARDIANCE 10 MG TABS tablet TAKE 1 TABLET BY MOUTH EVERY DAY BEFORE BREAKFAST 90 tablet 1   Lancets (ACCU-CHEK SOFT TOUCH) lancets Used to check blood sugar 3 times daily 100 each 12   lisinopril (ZESTRIL) 20 MG tablet TAKE 1 TABLET BY MOUTH EVERY DAY 90 tablet 3   metFORMIN (GLUCOPHAGE) 500 MG tablet TAKE 1 TABLET BY MOUTH TWICE A DAY WITH FOOD 180 tablet 1   Misc Natural Products ([employer] PROSTATE MAX + PO)  Take by mouth.     Multiple Vitamin (MULTIVITAMIN) tablet Take 1 tablet by mouth daily.     naproxen sodium (ALEVE) 220 MG tablet Take 220 mg by mouth 2 (two) times daily as needed (for knee pain.).     OVER THE COUNTER MEDICATION Probiotic extra strength, one capsule daily.     OVER THE COUNTER MEDICATION ClearLax, One capful as needed. May take twice a week.     No current facility-administered medications on file prior to visit.    BP (!) 160/80   Pulse 68   Temp 98.2 F (36.8 C) (Oral)   Ht 5' 9.5" (1.765 m)   Wt 243 lb (110.2 kg)   SpO2 97%   BMI 35.37 kg/m       Objective:   Physical Exam Vitals and nursing note reviewed.  Constitutional:      General: He is not in acute distress.    Appearance: Normal appearance. He is obese. He is not ill-appearing.  HENT:     Head: Normocephalic and atraumatic.     Right Ear: Tympanic membrane, ear canal and external ear normal. There is no impacted cerumen.     Left Ear: Tympanic membrane, ear canal and external ear normal. There is no impacted cerumen.     Nose: Nose normal. No congestion or rhinorrhea.     Mouth/Throat:     Mouth: Mucous membranes are moist.     Pharynx: Oropharynx is clear.  Eyes:     Extraocular Movements: Extraocular movements intact.     Conjunctiva/sclera: Conjunctivae normal.     Pupils: Pupils are equal, round, and reactive to light.  Neck:     Vascular: No carotid bruit.  Cardiovascular:     Rate and Rhythm: Normal rate and regular rhythm.     Pulses: Normal pulses.     Heart sounds: No murmur heard.    No friction rub. No gallop.  Pulmonary:     Effort: Pulmonary effort is normal.     Breath sounds: Normal breath sounds.  Abdominal:     General: Abdomen is flat. Bowel sounds are normal. There is no  distension.     Palpations: Abdomen is soft. There is no mass.     Tenderness: There is no abdominal tenderness. There is no guarding or rebound.     Hernia: No hernia is present.  Musculoskeletal:         General: Normal range of motion.     Cervical back: Normal range of motion and neck supple.  Lymphadenopathy:     Cervical: No cervical adenopathy.  Skin:    General: Skin is warm and dry.     Capillary Refill: Capillary refill takes less than 2 seconds.  Neurological:     General: No focal deficit present.     Mental Status: He is alert and oriented to person, place, and time.  Psychiatric:        Mood and Affect: Mood normal.        Behavior: Behavior normal.        Thought Content: Thought content normal.        Judgment: Judgment normal.        Assessment & Plan:  1. Routine general medical examination at a health care facility Today patient counseled on age appropriate routine health concerns for screening and prevention, each reviewed and up to date or declined. Immunizations reviewed and up to date or declined. Labs ordered and reviewed. Risk factors for depression reviewed and negative. Hearing function and visual acuity are intact. ADLs screened and addressed as needed. Functional ability and level of safety reviewed and appropriate. Education, counseling and referrals performed based on assessed risks today. Patient provided with a copy of personalized plan for preventive services. - Follow up in one year for CPE  - Follow up sooner if needed   2. Diabetes mellitus treated with oral medication (HCC) - Consider increase in Jardiance - Needs weight loss.  - Follow up in 3-6 months depending on A1c  - Lipid panel; Future - TSH; Future - CBC; Future - Comprehensive metabolic panel; Future - Hemoglobin A1c; Future - Microalbumin/Creatinine Ratio, Urine; Future  3. Essential hypertension - No change in medication  - Lipid panel; Future - TSH; Future - CBC; Future - Comprehensive metabolic panel; Future  4. Mixed hyperlipidemia - Consider increase in statin  - Lipid panel; Future - TSH; Future - CBC; Future - Comprehensive metabolic panel; Future  5.  Prostate cancer screening  - PSA; Future

## 2023-08-10 ENCOUNTER — Other Ambulatory Visit: Payer: Self-pay

## 2023-08-10 MED ORDER — EMPAGLIFLOZIN 25 MG PO TABS: 25.0000 mg | ORAL_TABLET | Freq: Every day | ORAL | 0 refills | Status: DC

## 2023-11-09 ENCOUNTER — Ambulatory Visit: Payer: Medicare PPO | Admitting: Adult Health

## 2023-11-11 ENCOUNTER — Ambulatory Visit: Payer: Medicare PPO | Admitting: Adult Health

## 2023-11-13 ENCOUNTER — Other Ambulatory Visit: Payer: Self-pay | Admitting: Adult Health

## 2023-11-16 ENCOUNTER — Encounter: Payer: Self-pay | Admitting: Adult Health

## 2023-11-16 ENCOUNTER — Ambulatory Visit: Payer: Medicare PPO | Admitting: Adult Health

## 2023-11-16 VITALS — BP 126/62 | HR 73 | Temp 98.6°F | Ht 69.5 in | Wt 234.0 lb

## 2023-11-16 DIAGNOSIS — Z7984 Long term (current) use of oral hypoglycemic drugs: Secondary | ICD-10-CM

## 2023-11-16 DIAGNOSIS — E119 Type 2 diabetes mellitus without complications: Secondary | ICD-10-CM

## 2023-11-16 DIAGNOSIS — I10 Essential (primary) hypertension: Secondary | ICD-10-CM

## 2023-11-16 LAB — BASIC METABOLIC PANEL
BUN: 15 mg/dL (ref 6–23)
CO2: 26 meq/L (ref 19–32)
Calcium: 9.2 mg/dL (ref 8.4–10.5)
Chloride: 106 meq/L (ref 96–112)
Creatinine, Ser: 0.96 mg/dL (ref 0.40–1.50)
GFR: 76.64 mL/min (ref >60.00)
Glucose, Bld: 114 mg/dL — ABNORMAL HIGH (ref 70–99)
Potassium: 4.3 meq/L (ref 3.5–5.1)
Sodium: 142 meq/L (ref 135–145)

## 2023-11-16 LAB — POCT GLYCOSYLATED HEMOGLOBIN (HGB A1C): Hemoglobin A1C: 7 % — AB (ref 4.0–5.6)

## 2023-11-16 MED ORDER — EMPAGLIFLOZIN 25 MG PO TABS: 25.0000 mg | ORAL_TABLET | Freq: Every day | ORAL | 0 refills | Status: DC

## 2023-11-16 NOTE — Progress Notes (Signed)
 Subjective:    Patient ID: Trevor Wheeler, male    DOB: 08-Nov-1945, 78 y.o.   MRN: [id number]  HPI 78 year old male who  has a past medical history of Cataract, CVA (cerebral vascular accident) (HCC) (05/04/2015), Hypertension, and Type II diabetes mellitus (HCC) (dx'd 05/05/2015).  Diabetes mellitus type 2-maintained on metformin 500 mg twice daily as well as glipizide 10 mg twice daily and Jardiance 25 mg ( increased three months ago).  He periodically monitors his blood sugar at home and reports readings in the 90's - 140.   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  He denies episodes of hypoglycemia. He has been eating more vegetables and less carbs or sugars.  Lab Results  Component Value Date   HGBA1C 7.0 (A) 11/16/2023   HGBA1C 7.0 (H) 08/09/2023   HGBA1C 6.6 (A) 02/03/2023   Hypertension-managed with lisinopril 20 mg daily.  He has not experienced any shortness of breath, dizziness, lightheadedness, chest pain, or shortness of breath BP Readings from Last 3 Encounters:  11/16/23 126/62  08/09/23 138/88  02/03/23 130/70   Wt Readings from Last 3 Encounters:  11/16/23 234 lb (106.1 kg)  08/09/23 243 lb (110.2 kg)  03/07/23 228 lb (103.4 kg)    Review of Systems See HPI   Past Medical History:  Diagnosis Date   Cataract    CVA (cerebral vascular accident) (HCC) 05/04/2015   L cerebellar, "only problem I have is w/balance" 05/05/2015)   Hypertension    Type II diabetes mellitus (HCC) dx'd 05/05/2015    Social History   Socioeconomic History   Marital status: Married    Spouse name: Not on file   Number of children: Not on file   Years of education: Not on file   Highest education level: GED or equivalent  Occupational History   Not on file  Tobacco Use   Smoking status: Former    Current packs/day: 0.00    Average packs/day: 1 pack/day for 20.0 years (20.0 ttl pk-yrs)    Types: Cigarettes    Start date:  05/21/1954    Quit date: 05/21/1974    Years since quitting: 49.5   Smokeless tobacco: Never   Tobacco comments:    quit smoking in the 1970's/ educated [employer]   Substance and Sexual Activity   Alcohol use: No    Alcohol/week: 0.0 standard drinks of alcohol   Drug use: No   Sexual activity: Not Currently  Other Topics Concern   Not on file  Social History Narrative   Employed as a Teaching laboratory technician.    Married for 30 years   Has two daughters who live locally.    Social Drivers of Corporate investment banker Strain: Low Risk  (11/07/2023)   Overall Financial Resource Strain (CARDIA)    Difficulty of Paying Living Expenses: Not hard at all  Food Insecurity: No Food Insecurity (11/07/2023)   Hunger Vital Sign    Worried About Running Out of Food in the Last Year: Never true    Ran Out of Food in the Last Year: Never true  Transportation Needs: No Transportation Needs (11/07/2023)   PRAPARE - Administrator, Civil Service (Medical): No    Lack of Transportation (Non-Medical): No  Physical Activity: Sufficiently Active (11/07/2023)   Exercise Vital Sign    Days of Exercise per Week: 7 days    Minutes of Exercise per Session:  40 min  Stress: No Stress Concern Present (11/07/2023)   [employer] of Occupational Health - Occupational Stress Questionnaire    Feeling of Stress : Not at all  Social Connections: Unknown (11/07/2023)   Social Connection and Isolation Panel [NHANES]    Frequency of Communication with Friends and Family: Twice a week    Frequency of Social Gatherings with Friends and Family: Twice a week    Attends Religious Services: Patient declined    Database administrator or Organizations: Yes    Attends Engineer, structural: More than 4 times per year    Marital Status: Married  Catering manager Violence: Not At Risk (03/07/2023)   Humiliation, Afraid, Rape, and Kick questionnaire    Fear of Current or Ex-Partner: No    Emotionally Abused: No     Physically Abused: No    Sexually Abused: No    Past Surgical History:  Procedure Laterality Date   Cataract Right 2018   EP IMPLANTABLE DEVICE N/A 05/07/2015   Procedure: Loop Recorder Insertion;  Surgeon: Thurmon Fair, MD;  Location: [facility];  Service: Cardiovascular;  Laterality: N/A;   LOOP RECORDER REMOVAL N/A 09/28/2017   Procedure: LOOP RECORDER REMOVAL;  Surgeon: Thurmon Fair, MD;  Location: [facility];  Service: Cardiovascular;  Laterality: N/A;   NO PAST SURGERIES     TEE WITHOUT CARDIOVERSION N/A 05/07/2015   Procedure: TRANSESOPHAGEAL ECHOCARDIOGRAM (TEE);  Surgeon: Chilton Si, MD;  Location: [facility] ENDOSCOPY;  Service: Cardiovascular;  Laterality: N/A;    Family History  Problem Relation Age of Onset   Heart disease Mother    Colon cancer Brother    Heart disease Brother    Esophageal cancer Neg Hx    Rectal cancer Neg Hx    Stomach cancer Neg Hx     Allergies  Allergen Reactions   Penicillins Hives    Has patient had a PCN reaction causing immediate rash, facial/tongue/throat swelling, SOB or lightheadedness with hypotension: No Has patient had a PCN reaction causing severe rash involving mucus membranes or skin necrosis: No Has patient had a PCN reaction that required hospitalization: No Has patient had a PCN reaction occurring within the last 10 years: No If all of the above answers are "NO", then may proceed with Cephalosporin use.     Current Outpatient Medications on File Prior to Visit  Medication Sig Dispense Refill   ACCU-CHEK GUIDE test strip USE TO CHECK BLOOD SUGAR 3 TIMES A DAY OR AS NEEDED 100 strip 3   aspirin EC 325 MG tablet Take 1 tablet (325 mg total) by mouth daily. 30 tablet 0   atorvastatin (LIPITOR) 20 MG tablet TAKE 1 TABLET BY MOUTH DAILY AT 6PM 90 tablet 3   empagliflozin (JARDIANCE) 25 MG TABS tablet Take 1 tablet (25 mg total) by mouth daily before breakfast. 90 tablet 0   glipiZIDE (GLUCOTROL) 10 MG tablet  TAKE 1 TABLET (10 MG TOTAL) BY MOUTH DAILY BEFORE BREAKFAST. 90 tablet 1   Lancets (ACCU-CHEK SOFT TOUCH) lancets Used to check blood sugar 3 times daily 100 each 12   lisinopril (ZESTRIL) 20 MG tablet TAKE 1 TABLET BY MOUTH EVERY DAY 90 tablet 3   metFORMIN (GLUCOPHAGE) 500 MG tablet TAKE 1 TABLET BY MOUTH TWICE A DAY WITH FOOD 180 tablet 1   Misc Natural Products (CVS PROSTATE MAX + PO) Take by mouth.     Multiple Vitamin (MULTIVITAMIN) tablet Take 1 tablet by mouth daily.  naproxen sodium (ALEVE) 220 MG tablet Take 220 mg by mouth 2 (two) times daily as needed (for knee pain.).     OVER THE COUNTER MEDICATION Probiotic extra strength, one capsule daily.     OVER THE COUNTER MEDICATION ClearLax, One capful as needed. May take twice a week.     No current facility-administered medications on file prior to visit.    BP 126/62   Pulse 73   Temp 98.6 F (37 C) (Oral)   Ht 5' 9.5" (1.765 m)   Wt 234 lb (106.1 kg)   SpO2 97%   BMI 34.06 kg/m       Objective:   Physical Exam Vitals and nursing note reviewed.  Constitutional:      Appearance: Normal appearance. He is obese.  Cardiovascular:     Rate and Rhythm: Normal rate and regular rhythm.     Pulses: Normal pulses.     Heart sounds: Normal heart sounds.  Pulmonary:     Effort: Pulmonary effort is normal.     Breath sounds: Normal breath sounds.  Skin:    General: Skin is warm and dry.  Neurological:     General: No focal deficit present.     Mental Status: He is alert and oriented to person, place, and time.  Psychiatric:        Mood and Affect: Mood normal.        Behavior: Behavior normal.        Thought Content: Thought content normal.        Judgment: Judgment normal.       Assessment & Plan:   1. Diabetes mellitus treated with oral medication (HCC) (Primary)  - POC HgB A1c- 7.0. Has not changed. - Will check BMP today. Continue with current therapy and get back into exercising - Follow up in three months   - Basic Metabolic Panel; Future - empagliflozin (JARDIANCE) 25 MG TABS tablet; Take 1 tablet (25 mg total) by mouth daily before breakfast.  Dispense: 90 tablet; Refill: 0  2. Essential hypertension  - Basic Metabolic Panel; Future   Shirline Frees, NP

## 2023-12-05 DIAGNOSIS — H25012 Cortical age-related cataract, left eye: Secondary | ICD-10-CM | POA: Diagnosis not present

## 2023-12-05 DIAGNOSIS — E103291 Type 1 diabetes mellitus with mild nonproliferative diabetic retinopathy without macular edema, right eye: Secondary | ICD-10-CM | POA: Diagnosis not present

## 2023-12-05 LAB — HM DIABETES EYE EXAM

## 2023-12-22 DIAGNOSIS — M1712 Unilateral primary osteoarthritis, left knee: Secondary | ICD-10-CM | POA: Diagnosis not present

## 2023-12-29 ENCOUNTER — Other Ambulatory Visit: Payer: Self-pay | Admitting: Adult Health

## 2023-12-29 DIAGNOSIS — E1159 Type 2 diabetes mellitus with other circulatory complications: Secondary | ICD-10-CM

## 2024-01-10 DIAGNOSIS — M1712 Unilateral primary osteoarthritis, left knee: Secondary | ICD-10-CM | POA: Diagnosis not present

## 2024-01-10 DIAGNOSIS — M25662 Stiffness of left knee, not elsewhere classified: Secondary | ICD-10-CM | POA: Diagnosis not present

## 2024-01-10 DIAGNOSIS — M6281 Muscle weakness (generalized): Secondary | ICD-10-CM | POA: Diagnosis not present

## 2024-01-13 ENCOUNTER — Telehealth: Payer: Self-pay

## 2024-01-13 NOTE — Telephone Encounter (Signed)
 Called pt to schedule medical clearance. Pt has been scheduled.

## 2024-01-17 DIAGNOSIS — M6281 Muscle weakness (generalized): Secondary | ICD-10-CM | POA: Diagnosis not present

## 2024-01-17 DIAGNOSIS — M25662 Stiffness of left knee, not elsewhere classified: Secondary | ICD-10-CM | POA: Diagnosis not present

## 2024-01-17 DIAGNOSIS — M1712 Unilateral primary osteoarthritis, left knee: Secondary | ICD-10-CM | POA: Diagnosis not present

## 2024-01-19 ENCOUNTER — Ambulatory Visit: Admitting: Adult Health

## 2024-01-19 ENCOUNTER — Encounter: Payer: Self-pay | Admitting: Adult Health

## 2024-01-19 VITALS — BP 124/80 | HR 77 | Temp 98.2°F | Ht 69.0 in | Wt 240.0 lb

## 2024-01-19 DIAGNOSIS — Z01818 Encounter for other preprocedural examination: Secondary | ICD-10-CM

## 2024-01-19 NOTE — Progress Notes (Signed)
 Subjective:    Patient ID: Trevor Wheeler, male    DOB: [birth year], 78 y.o.   MRN: [id number]  HPI 78 year old male who  has a past medical history of Cataract, CVA (cerebral vascular accident) (HCC) (05/04/2015), Hypertension, and Type II diabetes mellitus (HCC) (dx'd 05/05/2015).  He presents to the office today for preoperative clearance. He is having a left total knee arthroplasty done by Dr. Claud Crumb at [facility] orth[facility]. He is ready to have the surgery done. He does not have any questions   Lab Results  Component Value Date   HGBA1C 7.0 (A) 11/16/2023   HGBA1C 7.0 (H) 08/09/2023   HGBA1C 6.6 (A) 02/03/2023   BP Readings from Last 3 Encounters:  01/19/24 124/80  11/16/23 126/62  08/09/23 138/88    Review of Systems See HPI   Past Medical History:  Diagnosis Date   Cataract    CVA (cerebral vascular accident) (HCC) 05/04/2015   L cerebellar, "only problem I have is w/balance" 05/05/2015)   Hypertension    Type II diabetes mellitus (HCC) dx'd 05/05/2015    Social History   Socioeconomic History   Marital status: Married    Spouse name: Not on file   Number of children: Not on file   Years of education: Not on file   Highest education level: GED or equivalent  Occupational History   Not on file  Tobacco Use   Smoking status: Former    Current packs/day: 0.00    Average packs/day: 1 pack/day for 20.0 years (20.0 ttl pk-yrs)    Types: Cigarettes    Start date: 05/21/1954    Quit date: 05/21/1974    Years since quitting: 49.6   Smokeless tobacco: Never   Tobacco comments:    quit smoking in the 1970's/ educated [employer]   Substance and Sexual Activity   Alcohol use: No    Alcohol/week: 0.0 standard drinks of alcohol   Drug use: No   Sexual activity: Not Currently  Other Topics Concern   Not on file  Social History Narrative   Employed as a Teaching laboratory technician.    Married for 30 years   Has two daughters who live locally.    Social Drivers of Research scientist (physical sciences) Strain: Low Risk  (11/07/2023)   Overall Financial Resource Strain (CARDIA)    Difficulty of Paying Living Expenses: Not hard at all  Food Insecurity: No Food Insecurity (11/07/2023)   Hunger Vital Sign    Worried About Running Out of Food in the Last Year: Never true    Ran Out of Food in the Last Year: Never true  Transportation Needs: No Transportation Needs (11/07/2023)   PRAPARE - Administrator, Civil Service (Medical): No    Lack of Transportation (Non-Medical): No  Physical Activity: Sufficiently Active (11/07/2023)   Exercise Vital Sign    Days of Exercise per Week: 7 days    Minutes of Exercise per Session: 40 min  Stress: No Stress Concern Present (11/07/2023)   [employer] of Occupational Health - Occupational Stress Questionnaire    Feeling of Stress : Not at all  Social Connections: Unknown (11/07/2023)   Social Connection and Isolation Panel [NHANES]    Frequency of Communication with Friends and Family: Twice a week    Frequency of Social Gatherings with Friends and Family: Twice a week    Attends Religious Services: Patient declined    Database administrator or Organizations: Yes  Attends Banker Meetings: More than 4 times per year    Marital Status: Married  Catering manager Violence: Not At Risk (03/07/2023)   Humiliation, Afraid, Rape, and Kick questionnaire    Fear of Current or Ex-Partner: No    Emotionally Abused: No    Physically Abused: No    Sexually Abused: No    Past Surgical History:  Procedure Laterality Date   Cataract Right 2018   EP IMPLANTABLE DEVICE N/A 05/07/2015   Procedure: Loop Recorder Insertion;  Surgeon: Luana Rumple, MD;  Location: [facility];  Service: Cardiovascular;  Laterality: N/A;   LOOP RECORDER REMOVAL N/A 09/28/2017   Procedure: LOOP RECORDER REMOVAL;  Surgeon: Luana Rumple, MD;  Location: [facility];  Service: Cardiovascular;  Laterality: N/A;   NO PAST SURGERIES     TEE  WITHOUT CARDIOVERSION N/A 05/07/2015   Procedure: TRANSESOPHAGEAL ECHOCARDIOGRAM (TEE);  Surgeon: Maudine Sos, MD;  Location: [facility];  Service: Cardiovascular;  Laterality: N/A;    Family History  Problem Relation Age of Onset   Heart disease Mother    Colon cancer Brother    Heart disease Brother    Esophageal cancer Neg Hx    Rectal cancer Neg Hx    Stomach cancer Neg Hx     Allergies  Allergen Reactions   Penicillins Hives    Has patient had a PCN reaction causing immediate rash, facial/tongue/throat swelling, SOB or lightheadedness with hypotension: No Has patient had a PCN reaction causing severe rash involving mucus membranes or skin necrosis: No Has patient had a PCN reaction that required hospitalization: No Has patient had a PCN reaction occurring within the last 10 years: No If all of the above answers are "NO", then may proceed with Cephalosporin use.     Current Outpatient Medications on File Prior to Visit  Medication Sig Dispense Refill   ACCU-CHEK GUIDE test strip USE TO CHECK BLOOD SUGAR 3 TIMES A DAY OR AS NEEDED 100 strip 3   aspirin  EC 325 MG tablet Take 1 tablet (325 mg total) by mouth daily. 30 tablet 0   atorvastatin  (LIPITOR) 20 MG tablet TAKE 1 TABLET BY MOUTH DAILY AT 6PM 90 tablet 3   empagliflozin  (JARDIANCE ) 25 MG TABS tablet Take 1 tablet (25 mg total) by mouth daily before breakfast. 90 tablet 0   glipiZIDE  (GLUCOTROL ) 10 MG tablet TAKE 1 TABLET (10 MG TOTAL) BY MOUTH DAILY BEFORE BREAKFAST. 90 tablet 1   Lancets (ACCU-CHEK SOFT TOUCH) lancets Used to check blood sugar 3 times daily 100 each 12   lisinopril  (ZESTRIL ) 20 MG tablet TAKE 1 TABLET BY MOUTH EVERY DAY 90 tablet 3   metFORMIN  (GLUCOPHAGE ) 500 MG tablet TAKE 1 TABLET BY MOUTH TWICE A DAY WITH FOOD 180 tablet 1   Misc Natural Products ([employer]S PROSTATE MAX + PO) Take by mouth.     Multiple Vitamin (MULTIVITAMIN) tablet Take 1 tablet by mouth daily.     naproxen sodium (ALEVE) 220 MG  tablet Take 220 mg by mouth 2 (two) times daily as needed (for knee pain.).     OVER THE COUNTER MEDICATION Probiotic extra strength, one capsule daily.     OVER THE COUNTER MEDICATION ClearLax, One capful as needed. May take twice a week.     No current facility-administered medications on file prior to visit.    BP 124/80   Pulse 77   Temp 98.2 F (36.8 C) (Oral)   Ht 5\' 9"  (1.753 m)  Wt 240 lb (108.9 kg)   SpO2 97%   BMI 35.44 kg/m       Objective:   Physical Exam Vitals and nursing note reviewed.  Constitutional:      Appearance: Normal appearance. He is obese.  Cardiovascular:     Rate and Rhythm: Normal rate and regular rhythm.     Pulses: Normal pulses.     Heart sounds: Normal heart sounds.  Pulmonary:     Effort: Pulmonary effort is normal.     Breath sounds: Normal breath sounds.  Abdominal:     General: Abdomen is flat. Bowel sounds are normal.     Palpations: Abdomen is soft.  Musculoskeletal:        General: Tenderness present. No swelling.  Skin:    General: Skin is warm and dry.     Capillary Refill: Capillary refill takes less than 2 seconds.  Neurological:     General: No focal deficit present.     Mental Status: He is alert and oriented to person, place, and time.  Psychiatric:        Mood and Affect: Mood normal.        Behavior: Behavior normal.        Thought Content: Thought content normal.        Judgment: Judgment normal.        Assessment & Plan:  1. Preoperative clearance (Primary) - Cleared for left total knee replacement.  - EKG 12-Lead- NSR, - SR, left axis, Rate 72  - Lab work to be done prior to surgery by [employer]  - Hold ASA 10 days prior to surgery  - Follow up as needed   Alto Atta, NP

## 2024-01-24 DIAGNOSIS — M6281 Muscle weakness (generalized): Secondary | ICD-10-CM | POA: Diagnosis not present

## 2024-01-24 DIAGNOSIS — M1712 Unilateral primary osteoarthritis, left knee: Secondary | ICD-10-CM | POA: Diagnosis not present

## 2024-01-24 DIAGNOSIS — M25662 Stiffness of left knee, not elsewhere classified: Secondary | ICD-10-CM | POA: Diagnosis not present

## 2024-02-01 DIAGNOSIS — M1712 Unilateral primary osteoarthritis, left knee: Secondary | ICD-10-CM | POA: Diagnosis not present

## 2024-02-01 DIAGNOSIS — M25562 Pain in left knee: Secondary | ICD-10-CM | POA: Diagnosis not present

## 2024-02-01 DIAGNOSIS — R7303 Prediabetes: Secondary | ICD-10-CM | POA: Diagnosis not present

## 2024-02-14 ENCOUNTER — Ambulatory Visit: Payer: Medicare PPO | Admitting: Adult Health

## 2024-02-14 ENCOUNTER — Encounter: Payer: Self-pay | Admitting: Adult Health

## 2024-02-14 VITALS — BP 120/68 | HR 108 | Temp 100.6°F | Ht 69.0 in | Wt 238.0 lb

## 2024-02-14 DIAGNOSIS — J069 Acute upper respiratory infection, unspecified: Secondary | ICD-10-CM

## 2024-02-14 DIAGNOSIS — Z7984 Long term (current) use of oral hypoglycemic drugs: Secondary | ICD-10-CM

## 2024-02-14 DIAGNOSIS — I10 Essential (primary) hypertension: Secondary | ICD-10-CM

## 2024-02-14 DIAGNOSIS — E119 Type 2 diabetes mellitus without complications: Secondary | ICD-10-CM

## 2024-02-14 LAB — POCT GLYCOSYLATED HEMOGLOBIN (HGB A1C): Hemoglobin A1C: 7.1 % — AB (ref 4.0–5.6)

## 2024-02-14 LAB — POC COVID19 BINAXNOW: SARS Coronavirus 2 Ag: NEGATIVE

## 2024-02-14 MED ORDER — AZITHROMYCIN 250 MG PO TABS: ORAL_TABLET | ORAL | 0 refills | Status: AC

## 2024-02-14 NOTE — Patient Instructions (Signed)
 Health Maintenance Due  Topic Date Due   COVID-19 Vaccine (9 - 2024-25 season) 01/13/2024   Medicare Annual Wellness (AWV)  03/06/2024       08/09/2023    7:45 AM 03/07/2023   10:34 AM 03/01/2022    8:21 AM  Depression screen PHQ 2/9  Decreased Interest 0 0 0  Down, Depressed, Hopeless 0 0 0  PHQ - 2 Score 0 0 0  Altered sleeping 0    Tired, decreased energy 0    Change in appetite 0    Feeling bad or failure about yourself  0    Trouble concentrating 0    Moving slowly or fidgety/restless 0    Suicidal thoughts 0    PHQ-9 Score 0    Difficult doing work/chores Not difficult at all

## 2024-02-14 NOTE — Progress Notes (Signed)
 Subjective:    Patient ID: Trevor Wheeler, male    DOB: [birth year], 78 y.o.   MRN: [id number]  HPI 78 year old male who  has a past medical history of Cataract, CVA (cerebral vascular accident) (HCC) (05/04/2015), Hypertension, and Type II diabetes mellitus (HCC) (dx'd 05/05/2015).  He presents to the office today for three month follow up regarding DM and HTN  Diabetes mellitus type 2-maintained on metformin  500 mg twice daily as well as glipizide  10 mg twice daily and Jardiance  25 mg ( increased three months ago).  He periodically monitors his blood sugar at home and reports readings in the 90's - 140.   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  He denies episodes of hypoglycemia. He has been eating more vegetables and less carbs or sugars.   Lab Results  Component Value Date   HGBA1C 7.1 (A) 02/14/2024   HGBA1C 7.0 (A) 11/16/2023   HGBA1C 7.0 (H) 08/09/2023   Hypertension-managed with lisinopril  20 mg daily.  He has not experienced any shortness of breath, dizziness, lightheadedness, chest pain, or shortness of breath BP Readings from Last 3 Encounters:  02/14/24 120/68  01/19/24 124/80  11/16/23 126/62   Acutely he has been experiencing a cough for the last two days, cough is dry. He denies sinus pain or pressure, shortness of breath or wheezing. He is having left sided knee replacement surgery in three days.   Review of Systems See HPI   Past Medical History:  Diagnosis Date   Cataract    CVA (cerebral vascular accident) (HCC) 05/04/2015   L cerebellar, "only problem I have is w/balance" 05/05/2015)   Hypertension    Type II diabetes mellitus (HCC) dx'd 05/05/2015    Social History   Socioeconomic History   Marital status: Married    Spouse name: Not on file   Number of children: Not on file   Years of education: Not on file   Highest education level: GED or equivalent  Occupational History   Not on file   Tobacco Use   Smoking status: Former    Current packs/day: 0.00    Average packs/day: 1 pack/day for 20.0 years (20.0 ttl pk-yrs)    Types: Cigarettes    Start date: 05/21/1954    Quit date: 05/21/1974    Years since quitting: 49.7   Smokeless tobacco: Never   Tobacco comments:    quit smoking in the 1970's/ educated [employer]   Substance and Sexual Activity   Alcohol use: No    Alcohol/week: 0.0 standard drinks of alcohol   Drug use: No   Sexual activity: Not Currently  Other Topics Concern   Not on file  Social History Narrative   Employed as a Teaching laboratory technician.    Married for 30 years   Has two daughters who live locally.    Social Drivers of Corporate investment banker Strain: Low Risk  (11/07/2023)   Overall Financial Resource Strain (CARDIA)    Difficulty of Paying Living Expenses: Not hard at all  Food Insecurity: No Food Insecurity (11/07/2023)   Hunger Vital Sign    Worried About Running Out of Food in the Last Year: Never true    Ran Out of Food in the Last Year: Never true  Transportation Needs: No Transportation Needs (11/07/2023)   PRAPARE - Administrator, Civil Service (Medical): No    Lack of Transportation (Non-Medical): No  Physical Activity: Sufficiently Active (11/07/2023)   Exercise Vital Sign    Days of Exercise per Week: 7 days    Minutes of Exercise per Session: 40 min  Stress: No Stress Concern Present (11/07/2023)   [employer] of Occupational Health - Occupational Stress Questionnaire    Feeling of Stress : Not at all  Social Connections: Unknown (11/07/2023)   Social Connection and Isolation Panel [NHANES]    Frequency of Communication with Friends and Family: Twice a week    Frequency of Social Gatherings with Friends and Family: Twice a week    Attends Religious Services: Patient declined    Database administrator or Organizations: Yes    Attends Engineer, structural: More than 4 times per year    Marital Status:  Married  Catering manager Violence: Not At Risk (03/07/2023)   Humiliation, Afraid, Rape, and Kick questionnaire    Fear of Current or Ex-Partner: No    Emotionally Abused: No    Physically Abused: No    Sexually Abused: No    Past Surgical History:  Procedure Laterality Date   Cataract Right 2018   EP IMPLANTABLE DEVICE N/A 05/07/2015   Procedure: Loop Recorder Insertion;  Surgeon: Luana Rumple, MD;  Location: [facility];  Service: Cardiovascular;  Laterality: N/A;   LOOP RECORDER REMOVAL N/A 09/28/2017   Procedure: LOOP RECORDER REMOVAL;  Surgeon: Luana Rumple, MD;  Location: [facility];  Service: Cardiovascular;  Laterality: N/A;   NO PAST SURGERIES     TEE WITHOUT CARDIOVERSION N/A 05/07/2015   Procedure: TRANSESOPHAGEAL ECHOCARDIOGRAM (TEE);  Surgeon: Maudine Sos, MD;  Location: [facility];  Service: Cardiovascular;  Laterality: N/A;    Family History  Problem Relation Age of Onset   Heart disease Mother    Colon cancer Brother    Heart disease Brother    Esophageal cancer Neg Hx    Rectal cancer Neg Hx    Stomach cancer Neg Hx     Allergies  Allergen Reactions   Penicillins Hives    Has patient had a PCN reaction causing immediate rash, facial/tongue/throat swelling, SOB or lightheadedness with hypotension: No Has patient had a PCN reaction causing severe rash involving mucus membranes or skin necrosis: No Has patient had a PCN reaction that required hospitalization: No Has patient had a PCN reaction occurring within the last 10 years: No If all of the above answers are "NO", then may proceed with Cephalosporin use.     Current Outpatient Medications on File Prior to Visit  Medication Sig Dispense Refill   lisinopril  (ZESTRIL ) 20 MG tablet TAKE 1 TABLET BY MOUTH EVERY DAY 90 tablet 3   OVER THE COUNTER MEDICATION Probiotic extra strength, one capsule daily.     ACCU-CHEK GUIDE test strip USE TO CHECK BLOOD SUGAR 3 TIMES A DAY OR AS NEEDED  (Patient not taking: Reported on 02/14/2024) 100 strip 3   aspirin  EC 325 MG tablet Take 1 tablet (325 mg total) by mouth daily. (Patient not taking: Reported on 02/14/2024) 30 tablet 0   atorvastatin  (LIPITOR) 20 MG tablet TAKE 1 TABLET BY MOUTH DAILY AT 6PM (Patient not taking: Reported on 02/14/2024) 90 tablet 3   empagliflozin  (JARDIANCE ) 25 MG TABS tablet Take 1 tablet (25 mg total) by mouth daily before breakfast. (Patient not taking: Reported on 02/14/2024) 90 tablet 0   glipiZIDE  (GLUCOTROL ) 10 MG tablet TAKE 1 TABLET (10 MG TOTAL) BY MOUTH DAILY BEFORE BREAKFAST. (Patient not taking: Reported on  02/14/2024) 90 tablet 1   Lancets (ACCU-CHEK SOFT TOUCH) lancets Used to check blood sugar 3 times daily (Patient not taking: Reported on 02/14/2024) 100 each 12   metFORMIN  (GLUCOPHAGE ) 500 MG tablet TAKE 1 TABLET BY MOUTH TWICE A DAY WITH FOOD (Patient not taking: Reported on 02/14/2024) 180 tablet 1   Misc Natural Products (CVS PROSTATE MAX + PO) Take by mouth. (Patient not taking: Reported on 02/14/2024)     Multiple Vitamin (MULTIVITAMIN) tablet Take 1 tablet by mouth daily. (Patient not taking: Reported on 02/14/2024)     naproxen sodium (ALEVE) 220 MG tablet Take 220 mg by mouth 2 (two) times daily as needed (for knee pain.). (Patient not taking: Reported on 02/14/2024)     OVER THE COUNTER MEDICATION ClearLax, One capful as needed. May take twice a week. (Patient not taking: Reported on 02/14/2024)     No current facility-administered medications on file prior to visit.    BP 120/68   Pulse (!) 108   Temp (!) 100.6 F (38.1 C) (Oral)   Ht 5\' 9"  (1.753 m)   Wt 238 lb (108 kg)   SpO2 95%   BMI 35.15 kg/m       Objective:   Physical Exam Vitals and nursing note reviewed.  Constitutional:      Appearance: Normal appearance.  Cardiovascular:     Rate and Rhythm: Normal rate and regular rhythm.     Pulses: Normal pulses.     Heart sounds: Normal heart sounds.  Pulmonary:     Effort:  Pulmonary effort is normal.     Breath sounds: Normal breath sounds.  Musculoskeletal:        General: No swelling or deformity. Normal range of motion.  Skin:    General: Skin is warm and dry.     Findings: No erythema.  Neurological:     General: No focal deficit present.     Mental Status: He is alert and oriented to person, place, and time.  Psychiatric:        Mood and Affect: Mood normal.        Behavior: Behavior normal.        Thought Content: Thought content normal.        Judgment: Judgment normal.       Assessment & Plan:  1. Diabetes mellitus treated with oral medication (HCC) (Primary)  - POC HgB A1c- 7.1 - He has had to stop his medication except glipizide  prior to surgery  - Follow up in 3 months   2. Essential hypertension - Well controlled. No change in medication   3. Upper respiratory tract infection, unspecified type - POC COVID-19 - negative.  - Since he has surgery this week and has a low grade fever will treat with Azithromyin.  - Follow up if symptoms worsen - azithromycin (ZITHROMAX) 250 MG tablet; Take 2 tablets on day 1, then 1 tablet daily on days 2 through 5  Dispense: 6 tablet; Refill: 0  Alto Atta, NP

## 2024-02-17 DIAGNOSIS — G8918 Other acute postprocedural pain: Secondary | ICD-10-CM | POA: Diagnosis not present

## 2024-02-17 DIAGNOSIS — M21162 Varus deformity, not elsewhere classified, left knee: Secondary | ICD-10-CM | POA: Diagnosis not present

## 2024-02-17 DIAGNOSIS — M1712 Unilateral primary osteoarthritis, left knee: Secondary | ICD-10-CM | POA: Diagnosis not present

## 2024-02-17 DIAGNOSIS — Z96652 Presence of left artificial knee joint: Secondary | ICD-10-CM | POA: Diagnosis not present

## 2024-02-20 DIAGNOSIS — R531 Weakness: Secondary | ICD-10-CM | POA: Diagnosis not present

## 2024-02-20 DIAGNOSIS — R262 Difficulty in walking, not elsewhere classified: Secondary | ICD-10-CM | POA: Diagnosis not present

## 2024-02-20 DIAGNOSIS — M25662 Stiffness of left knee, not elsewhere classified: Secondary | ICD-10-CM | POA: Diagnosis not present

## 2024-02-20 DIAGNOSIS — Z96652 Presence of left artificial knee joint: Secondary | ICD-10-CM | POA: Diagnosis not present

## 2024-02-23 DIAGNOSIS — Z96652 Presence of left artificial knee joint: Secondary | ICD-10-CM | POA: Diagnosis not present

## 2024-02-23 DIAGNOSIS — M25662 Stiffness of left knee, not elsewhere classified: Secondary | ICD-10-CM | POA: Diagnosis not present

## 2024-02-23 DIAGNOSIS — R531 Weakness: Secondary | ICD-10-CM | POA: Diagnosis not present

## 2024-02-23 DIAGNOSIS — R262 Difficulty in walking, not elsewhere classified: Secondary | ICD-10-CM | POA: Diagnosis not present

## 2024-02-24 HISTORY — PX: KNEE SURGERY: SHX244

## 2024-02-28 DIAGNOSIS — R531 Weakness: Secondary | ICD-10-CM | POA: Diagnosis not present

## 2024-02-28 DIAGNOSIS — Z96652 Presence of left artificial knee joint: Secondary | ICD-10-CM | POA: Diagnosis not present

## 2024-02-28 DIAGNOSIS — M25662 Stiffness of left knee, not elsewhere classified: Secondary | ICD-10-CM | POA: Diagnosis not present

## 2024-02-28 DIAGNOSIS — R262 Difficulty in walking, not elsewhere classified: Secondary | ICD-10-CM | POA: Diagnosis not present

## 2024-03-01 DIAGNOSIS — R531 Weakness: Secondary | ICD-10-CM | POA: Diagnosis not present

## 2024-03-01 DIAGNOSIS — R262 Difficulty in walking, not elsewhere classified: Secondary | ICD-10-CM | POA: Diagnosis not present

## 2024-03-01 DIAGNOSIS — M25662 Stiffness of left knee, not elsewhere classified: Secondary | ICD-10-CM | POA: Diagnosis not present

## 2024-03-01 DIAGNOSIS — Z96652 Presence of left artificial knee joint: Secondary | ICD-10-CM | POA: Diagnosis not present

## 2024-03-05 DIAGNOSIS — Z96652 Presence of left artificial knee joint: Secondary | ICD-10-CM | POA: Diagnosis not present

## 2024-03-05 DIAGNOSIS — R262 Difficulty in walking, not elsewhere classified: Secondary | ICD-10-CM | POA: Diagnosis not present

## 2024-03-05 DIAGNOSIS — R531 Weakness: Secondary | ICD-10-CM | POA: Diagnosis not present

## 2024-03-05 DIAGNOSIS — M25662 Stiffness of left knee, not elsewhere classified: Secondary | ICD-10-CM | POA: Diagnosis not present

## 2024-03-08 DIAGNOSIS — R262 Difficulty in walking, not elsewhere classified: Secondary | ICD-10-CM | POA: Diagnosis not present

## 2024-03-08 DIAGNOSIS — M25662 Stiffness of left knee, not elsewhere classified: Secondary | ICD-10-CM | POA: Diagnosis not present

## 2024-03-08 DIAGNOSIS — R531 Weakness: Secondary | ICD-10-CM | POA: Diagnosis not present

## 2024-03-08 DIAGNOSIS — Z96652 Presence of left artificial knee joint: Secondary | ICD-10-CM | POA: Diagnosis not present

## 2024-03-09 ENCOUNTER — Ambulatory Visit: Payer: Medicare PPO

## 2024-03-09 VITALS — Ht 71.0 in | Wt 238.0 lb

## 2024-03-09 DIAGNOSIS — Z0000 Encounter for general adult medical examination without abnormal findings: Secondary | ICD-10-CM | POA: Diagnosis not present

## 2024-03-09 NOTE — Progress Notes (Signed)
 Subjective:   Trevor Wheeler is a 78 y.o. who presents for a Medicare Wellness preventive visit.  As a reminder, Annual Wellness Visits don't include a physical exam, and some assessments may be limited, especially if this visit is performed virtually. We may recommend an in-person follow-up visit with your provider if needed.  Visit Complete: Virtual I connected with  Trevor Wheeler on 03/09/24 by a audio enabled telemedicine application and verified that I am speaking with the correct person using two identifiers.  Patient Location: Home  Provider Location: Home Office  I discussed the limitations of evaluation and management by telemedicine. The patient expressed understanding and agreed to proceed.  Vital Signs: Because this visit was a virtual/telehealth visit, some criteria may be missing or patient reported. Any vitals not documented were not able to be obtained and vitals that have been documented are patient reported.    Persons Participating in Visit: Patient.  AWV Questionnaire: Yes: Patient Medicare AWV questionnaire was completed by the patient on 03/08/24; I have confirmed that all information answered by patient is correct and no changes since this date.  Cardiac Risk Factors include: advanced age (>55men, >73 women);male gender;diabetes mellitus;hypertension     Objective:    Today's Vitals   03/09/24 1006  Weight: 238 lb (108 kg)  Height: 5' 11 (1.803 m)   Body mass index is 33.19 kg/m.     03/09/2024   10:20 AM 03/07/2023   10:38 AM 03/01/2022    8:19 AM 02/26/2021    8:24 AM 07/11/2018    8:17 AM 09/28/2017   12:56 PM 07/08/2017    8:30 AM  Advanced Directives  Does Patient Have a Medical Advance Directive? No No No Yes No  No  No   Type of Theme park manager;Living will     Copy of Healthcare Power of Attorney in Chart?    No - copy requested     Would patient like information on creating a medical advance directive? No -  Patient declined No - Patient declined    No - Patient declined       Data saved with a previous flowsheet row definition    Current Medications (verified) Outpatient Encounter Medications as of 03/09/2024  Medication Sig   ACCU-CHEK GUIDE test strip USE TO CHECK BLOOD SUGAR 3 TIMES A DAY OR AS NEEDED   atorvastatin  (LIPITOR) 20 MG tablet TAKE 1 TABLET BY MOUTH DAILY AT 6PM   empagliflozin  (JARDIANCE ) 25 MG TABS tablet Take 1 tablet (25 mg total) by mouth daily before breakfast.   glipiZIDE  (GLUCOTROL ) 10 MG tablet TAKE 1 TABLET (10 MG TOTAL) BY MOUTH DAILY BEFORE BREAKFAST.   Lancets (ACCU-CHEK SOFT TOUCH) lancets Used to check blood sugar 3 times daily   metFORMIN  (GLUCOPHAGE ) 500 MG tablet TAKE 1 TABLET BY MOUTH TWICE A DAY WITH FOOD   Misc Natural Products ([employer] PROSTATE MAX + PO) Take by mouth.   Multiple Vitamin (MULTIVITAMIN) tablet Take 1 tablet by mouth daily.   naproxen sodium (ALEVE) 220 MG tablet Take 220 mg by mouth 2 (two) times daily as needed (for knee pain.).   OVER THE COUNTER MEDICATION ClearLax, One capful as needed. May take twice a week.   aspirin  EC 325 MG tablet Take 1 tablet (325 mg total) by mouth daily. (Patient not taking: Reported on 02/14/2024)   lisinopril  (ZESTRIL ) 20 MG tablet TAKE 1 TABLET BY MOUTH EVERY DAY   OVER THE COUNTER  MEDICATION Probiotic extra strength, one capsule daily.   No facility-administered encounter medications on file as of 03/09/2024.    Allergies (verified) Penicillins   History: Past Medical History:  Diagnosis Date   Cataract    CVA (cerebral vascular accident) (HCC) 05/04/2015   L cerebellar, only problem I have is w/balance 05/05/2015)   Hypertension    Type II diabetes mellitus (HCC) dx'd 05/05/2015   Past Surgical History:  Procedure Laterality Date   Cataract Right 2018   EP IMPLANTABLE DEVICE N/A 05/07/2015   Procedure: Loop Recorder Insertion;  Surgeon: Luana Rumple, MD;  Location: [facility] INVASIVE [facility];  Service:  Cardiovascular;  Laterality: N/A;   KNEE SURGERY Left 02/24/2024   LOOP RECORDER REMOVAL N/A 09/28/2017   Procedure: LOOP RECORDER REMOVAL;  Surgeon: Luana Rumple, MD;  Location: [facility];  Service: Cardiovascular;  Laterality: N/A;   NO PAST SURGERIES     TEE WITHOUT CARDIOVERSION N/A 05/07/2015   Procedure: TRANSESOPHAGEAL ECHOCARDIOGRAM (TEE);  Surgeon: Maudine Sos, MD;  Location: [facility] ENDOSCOPY;  Service: Cardiovascular;  Laterality: N/A;   Family History  Problem Relation Age of Onset   Heart disease Mother    Colon cancer Brother    Heart disease Brother    Esophageal cancer Neg Hx    Rectal cancer Neg Hx    Stomach cancer Neg Hx    Social History   Socioeconomic History   Marital status: Married    Spouse name: Not on file   Number of children: Not on file   Years of education: Not on file   Highest education level: GED or equivalent  Occupational History   Not on file  Tobacco Use   Smoking status: Former    Current packs/day: 0.00    Average packs/day: 1 pack/day for 20.0 years (20.0 ttl pk-yrs)    Types: Cigarettes    Start date: 05/21/1954    Quit date: 05/21/1974    Years since quitting: 49.8   Smokeless tobacco: Never   Tobacco comments:    quit smoking in the 1970's/ educated [employer]   Substance and Sexual Activity   Alcohol use: No    Alcohol/week: 0.0 standard drinks of alcohol   Drug use: No   Sexual activity: Not Currently  Other Topics Concern   Not on file  Social History Narrative   Employed as a Teaching laboratory technician.    Married for 30 years   Has two daughters who live locally.    Social Drivers of Corporate investment banker Strain: Low Risk  (03/09/2024)   Overall Financial Resource Strain (CARDIA)    Difficulty of Paying Living Expenses: Not hard at all  Food Insecurity: No Food Insecurity (03/09/2024)   Hunger Vital Sign    Worried About Running Out of Food in the Last Year: Never true    Ran Out of Food in the Last Year:  Never true  Transportation Needs: No Transportation Needs (03/09/2024)   PRAPARE - Administrator, Civil Service (Medical): No    Lack of Transportation (Non-Medical): No  Physical Activity: Sufficiently Active (03/09/2024)   Exercise Vital Sign    Days of Exercise per Week: 7 days    Minutes of Exercise per Session: 50 min  Stress: No Stress Concern Present (03/09/2024)   [employer] of Occupational Health - Occupational Stress Questionnaire    Feeling of Stress: Not at all  Social Connections: Unknown (03/09/2024)   Social Connection and Isolation Panel  Frequency of Communication with Friends and Family: More than three times a week    Frequency of Social Gatherings with Friends and Family: Three times a week    Attends Religious Services: Patient declined    Active Member of Clubs or Organizations: Not on file    Attends Banker Meetings: Not on file    Marital Status: Married    Tobacco Counseling Counseling given: Not Answered Tobacco comments: quit smoking in the 1970's/ educated AAA     Clinical Intake:  Pre-visit preparation completed: Yes  Pain : No/denies pain     BMI - recorded: 33.19 Nutritional Status: BMI > 30  Obese Nutritional Risks: None Diabetes: Yes CBG done?: Yes (CBG 145 Per patient) CBG resulted in Enter/ Edit results?: Yes Did pt. bring in CBG monitor from home?: No  Lab Results  Component Value Date   HGBA1C 7.1 (A) 02/14/2024   HGBA1C 7.0 (A) 11/16/2023   HGBA1C 7.0 (H) 08/09/2023     How often do you need to have someone help you when you read instructions, pamphlets, or other written materials from your doctor or pharmacy?: 1 - Never  Interpreter Needed?: No  Information entered by :: Farris Hong LPN   Activities of Daily Living     03/09/2024   10:18 AM 03/08/2024    4:43 PM  In your present state of health, do you have any difficulty performing the following activities:  Hearing? 0 0  Vision? 0  0  Difficulty concentrating or making decisions? 0 0  Walking or climbing stairs? 1 0  Comment Uses a Cane and Walker due to left knee surgery   Dressing or bathing? 0 0  Doing errands, shopping? 0 0  Preparing Food and eating ? N N  Using the Toilet? N N  In the past six months, have you accidently leaked urine? N N  Do you have problems with loss of bowel control? N N  Managing your Medications? N N  Managing your Finances? N N  Housekeeping or managing your Housekeeping? N N    Patient Care Team: Alto Atta, NP as PCP - General (Family Medicine) Croitoru, Karyl Paget, MD as Consulting Physician (Cardiology)  I have updated your Care Teams any recent Medical Services you may have received from other providers in the past year.     Assessment:   This is a routine wellness examination for [place].  Hearing/Vision screen Hearing Screening - Comments:: Denies hearing difficulties   Vision Screening - Comments:: Wears rx glasses - up to date with routine eye exams with  [facility]   Goals Addressed               This Visit's Progress     Increase physical activity (pt-stated)        Remain active       Depression Screen     03/09/2024   10:16 AM 08/09/2023    7:45 AM 03/07/2023   10:34 AM 03/01/2022    8:21 AM 01/28/2022    7:01 AM 02/26/2021    8:25 AM 02/26/2021    8:21 AM  PHQ 2/9 Scores  PHQ - 2 Score 0 0 0 0 0 0 0  PHQ- 9 Score  0   0      Fall Risk     03/09/2024   10:19 AM 03/08/2024    4:43 PM 08/09/2023    7:44 AM 03/07/2023   10:38 AM 03/07/2023    7:07 AM  Fall Risk   Falls in the past year? 0 0 0 0 0  Number falls in past yr: 0  0 0   Injury with Fall? 0  0 0   Risk for fall due to : No Fall Risks  No Fall Risks No Fall Risks   Follow up Falls evaluation completed  Falls evaluation completed Falls prevention discussed     MEDICARE RISK AT HOME:  Medicare Risk at Home Any stairs in or around the home?: Yes If so, are there any without  handrails?: No Home free of loose throw rugs in walkways, pet beds, electrical cords, etc?: Yes Adequate lighting in your home to reduce risk of falls?: Yes Life alert?: No Use of a cane, walker or w/c?: Yes Grab bars in the bathroom?: No Shower chair or bench in shower?: No Elevated toilet seat or a handicapped toilet?: No  TIMED UP AND GO:  Was the test performed?  No  Cognitive Function: 6CIT completed    07/11/2018    8:20 AM 07/08/2017    8:35 AM  MMSE - Mini Mental State Exam  Not completed: -- --        03/09/2024   10:20 AM 03/07/2023   10:39 AM 03/01/2022    8:23 AM  6CIT Screen  What Year? 0 points 0 points 0 points  What month? 0 points 0 points 0 points  What time? 0 points 0 points 0 points  Count back from 20 0 points 0 points 0 points  Months in reverse 0 points 0 points 2 points  Repeat phrase 0 points 0 points 2 points  Total Score 0 points 0 points 4 points    Immunizations Immunization History  Administered Date(s) Administered   Fluad Quad(high Dose 65+) 05/15/2019, 06/28/2022   Fluad Trivalent(High Dose 65+) 07/15/2023   Influenza, High Dose Seasonal PF 06/19/2015, 06/23/2016, 05/19/2017, 05/03/2018, 06/23/2020, 06/26/2021   Influenza-Unspecified 05/21/2017   PFIZER Comirnaty(Gray Top)Covid-19 Tri-Sucrose Vaccine 02/03/2021   PFIZER(Purple Top)SARS-COV-2 Vaccination 10/27/2019, 11/17/2019, 06/23/2020   PNEUMOCOCCAL CONJUGATE-20 02/01/2022   Pfizer Covid-19 Vaccine Bivalent Booster 53yrs & up 06/26/2021, 02/01/2022   Pfizer(Comirnaty)Fall Seasonal Vaccine 12 years and older 06/29/2022, 07/15/2023   Pneumococcal Conjugate-13 06/23/2016   Pneumococcal Polysaccharide-23 05/06/2015   Respiratory Syncytial Virus Vaccine,Recomb Aduvanted(Arexvy) 08/17/2022   Td 10/14/2020   Zoster Recombinant(Shingrix) 08/17/2022, 03/08/2023    Screening Tests Health Maintenance  Topic Date Due   COVID-19 Vaccine (9 - 2024-25 season) 01/13/2024   INFLUENZA  VACCINE  04/20/2024   Diabetic kidney evaluation - Urine ACR  08/08/2024   FOOT EXAM  08/08/2024   HEMOGLOBIN A1C  08/16/2024   Diabetic kidney evaluation - eGFR measurement  11/15/2024   OPHTHALMOLOGY EXAM  12/04/2024   Medicare Annual Wellness (AWV)  03/09/2025   DTaP/Tdap/Td (2 - Tdap) 10/14/2030   Pneumococcal Vaccine: 50+ Years  Completed   Hepatitis C Screening  Completed   Zoster Vaccines- Shingrix  Completed   HPV VACCINES  Aged Out   Meningococcal B Vaccine  Aged Out    Health Maintenance  Health Maintenance Due  Topic Date Due   COVID-19 Vaccine (9 - 2024-25 season) 01/13/2024   Health Maintenance Items Addressed:   Additional Screening:  Vision Screening: Recommended annual ophthalmology exams for early detection of glaucoma and other disorders of the eye. Would you like a referral to an eye doctor? No    Dental Screening: Recommended annual dental exams for proper oral hygiene  Community Resource Referral / Chronic Care Management:  CRR required this visit?  No   CCM required this visit?  No   Plan:    I have personally reviewed and noted the following in the patient's chart:   Medical and social history Use of alcohol, tobacco or illicit drugs  Current medications and supplements including opioid prescriptions. Patient is not currently taking opioid prescriptions. Functional ability and status Nutritional status Physical activity Advanced directives List of other physicians Hospitalizations, surgeries, and ER visits in previous 12 months Vitals Screenings to include cognitive, depression, and falls Referrals and appointments  In addition, I have reviewed and discussed with patient certain preventive protocols, quality metrics, and best practice recommendations. A written personalized care plan for preventive services as well as general preventive health recommendations were provided to patient.   Dewayne Ford, LPN   [id number]/61/0960   After Visit  Summary: (MyChart) Due to this being a telephonic visit, the after visit summary with patients personalized plan was offered to patient via MyChart   Notes: Nothing significant to report at this time.

## 2024-03-09 NOTE — Patient Instructions (Addendum)
 [name] , Thank you for taking time out of your busy schedule to complete your Annual Wellness Visit with me. I enjoyed our conversation and look forward to speaking with you again next year. I, as well as your care team,  appreciate your ongoing commitment to your health goals. Please review the following plan we discussed and let me know if I can assist you in the future. Your Game plan/ To Do List    Referrals: If you haven't heard from the office you've been referred to, please reach out to them at the phone provided.   Follow up Visits: Next Medicare AWV with our clinical staff: 03/15/25 @ 10a   Have you seen your provider in the last 6 months (3 months if uncontrolled diabetes)?  Next Office Visit with your provider: 05/22/24 @ 7:30a  Clinician Recommendations:  Aim for 30 minutes of exercise or brisk walking, 6-8 glasses of water, and 5 servings of fruits and vegetables each day.       This is a list of the screening recommended for you and due dates:  Health Maintenance  Topic Date Due   COVID-19 Vaccine (9 - 2024-25 season) 01/13/2024   Flu Shot  04/20/2024   Yearly kidney health urinalysis for diabetes  08/08/2024   Complete foot exam   08/08/2024   Hemoglobin A1C  08/16/2024   Yearly kidney function blood test for diabetes  11/15/2024   Eye exam for diabetics  12/04/2024   Medicare Annual Wellness Visit  03/09/2025   DTaP/Tdap/Td vaccine (2 - Tdap) 10/14/2030   Pneumococcal Vaccine for age over 10  Completed   Hepatitis C Screening  Completed   Zoster (Shingles) Vaccine  Completed   HPV Vaccine  Aged Out   Meningitis B Vaccine  Aged Out    Advanced directives: (Declined) Advance directive discussed with you today. Even though you declined this today, please call our office should you change your mind, and we can give you the proper paperwork for you to fill out. Advance Care Planning is important because it:  [x]  Makes sure you receive the medical care that is consistent  with your values, goals, and preferences  [x]  It provides guidance to your family and loved ones and reduces their decisional burden about whether or not they are making the right decisions based on your wishes.  Follow the link provided in your after visit summary or read over the paperwork we have mailed to you to help you started getting your Advance Directives in place. If you need assistance in completing these, please reach out to us  so that we can help you!  See attachments for Preventive Care and Fall Prevention Tips.

## 2024-03-15 DIAGNOSIS — R531 Weakness: Secondary | ICD-10-CM | POA: Diagnosis not present

## 2024-03-15 DIAGNOSIS — R262 Difficulty in walking, not elsewhere classified: Secondary | ICD-10-CM | POA: Diagnosis not present

## 2024-03-15 DIAGNOSIS — M25662 Stiffness of left knee, not elsewhere classified: Secondary | ICD-10-CM | POA: Diagnosis not present

## 2024-03-15 DIAGNOSIS — Z96652 Presence of left artificial knee joint: Secondary | ICD-10-CM | POA: Diagnosis not present

## 2024-03-19 DIAGNOSIS — R262 Difficulty in walking, not elsewhere classified: Secondary | ICD-10-CM | POA: Diagnosis not present

## 2024-03-19 DIAGNOSIS — Z96652 Presence of left artificial knee joint: Secondary | ICD-10-CM | POA: Diagnosis not present

## 2024-03-19 DIAGNOSIS — R531 Weakness: Secondary | ICD-10-CM | POA: Diagnosis not present

## 2024-03-19 DIAGNOSIS — M25662 Stiffness of left knee, not elsewhere classified: Secondary | ICD-10-CM | POA: Diagnosis not present

## 2024-03-21 DIAGNOSIS — R262 Difficulty in walking, not elsewhere classified: Secondary | ICD-10-CM | POA: Diagnosis not present

## 2024-03-21 DIAGNOSIS — M25662 Stiffness of left knee, not elsewhere classified: Secondary | ICD-10-CM | POA: Diagnosis not present

## 2024-03-21 DIAGNOSIS — R531 Weakness: Secondary | ICD-10-CM | POA: Diagnosis not present

## 2024-03-21 DIAGNOSIS — Z96652 Presence of left artificial knee joint: Secondary | ICD-10-CM | POA: Diagnosis not present

## 2024-03-27 DIAGNOSIS — R262 Difficulty in walking, not elsewhere classified: Secondary | ICD-10-CM | POA: Diagnosis not present

## 2024-03-27 DIAGNOSIS — R531 Weakness: Secondary | ICD-10-CM | POA: Diagnosis not present

## 2024-03-27 DIAGNOSIS — M25662 Stiffness of left knee, not elsewhere classified: Secondary | ICD-10-CM | POA: Diagnosis not present

## 2024-03-27 DIAGNOSIS — Z96652 Presence of left artificial knee joint: Secondary | ICD-10-CM | POA: Diagnosis not present

## 2024-03-29 ENCOUNTER — Other Ambulatory Visit: Payer: Self-pay | Admitting: Adult Health

## 2024-03-29 DIAGNOSIS — E119 Type 2 diabetes mellitus without complications: Secondary | ICD-10-CM

## 2024-03-30 DIAGNOSIS — R262 Difficulty in walking, not elsewhere classified: Secondary | ICD-10-CM | POA: Diagnosis not present

## 2024-03-30 DIAGNOSIS — Z96652 Presence of left artificial knee joint: Secondary | ICD-10-CM | POA: Diagnosis not present

## 2024-03-30 DIAGNOSIS — M25662 Stiffness of left knee, not elsewhere classified: Secondary | ICD-10-CM | POA: Diagnosis not present

## 2024-03-30 DIAGNOSIS — R531 Weakness: Secondary | ICD-10-CM | POA: Diagnosis not present

## 2024-04-02 DIAGNOSIS — R531 Weakness: Secondary | ICD-10-CM | POA: Diagnosis not present

## 2024-04-02 DIAGNOSIS — Z96652 Presence of left artificial knee joint: Secondary | ICD-10-CM | POA: Diagnosis not present

## 2024-04-02 DIAGNOSIS — M25662 Stiffness of left knee, not elsewhere classified: Secondary | ICD-10-CM | POA: Diagnosis not present

## 2024-04-02 DIAGNOSIS — R262 Difficulty in walking, not elsewhere classified: Secondary | ICD-10-CM | POA: Diagnosis not present

## 2024-05-22 ENCOUNTER — Encounter: Payer: Self-pay | Admitting: Adult Health

## 2024-05-22 ENCOUNTER — Ambulatory Visit: Admitting: Adult Health

## 2024-05-22 VITALS — BP 130/80 | HR 76 | Temp 98.1°F | Ht 71.0 in | Wt 228.0 lb

## 2024-05-22 DIAGNOSIS — E119 Type 2 diabetes mellitus without complications: Secondary | ICD-10-CM | POA: Diagnosis not present

## 2024-05-22 DIAGNOSIS — I10 Essential (primary) hypertension: Secondary | ICD-10-CM

## 2024-05-22 DIAGNOSIS — Z7984 Long term (current) use of oral hypoglycemic drugs: Secondary | ICD-10-CM

## 2024-05-22 LAB — POCT GLYCOSYLATED HEMOGLOBIN (HGB A1C): Hemoglobin A1C: 6.6 % — AB (ref 4.0–5.6)

## 2024-05-22 MED ORDER — EMPAGLIFLOZIN 25 MG PO TABS: 25.0000 mg | ORAL_TABLET | Freq: Every day | ORAL | 1 refills | Status: AC

## 2024-05-22 NOTE — Progress Notes (Signed)
 Subjective:    Patient ID: Trevor Wheeler, male    DOB: 1946-08-02, 78 y.o.   MRN: [id number]  HPI 78 year old male who   has a past medical history of Cataract, CVA (cerebral vascular accident) (HCC) (05/04/2015), Hypertension, and Type II diabetes mellitus (HCC) (dx'd 05/05/2015).  He presents to the office today for three month follow up regarding DM and HTN  Diabetes mellitus type 2-maintained on metformin  500 mg twice daily as well as glipizide  10 mg twice daily and Jardiance  25 mg ( increased three months ago).  He periodically monitors his blood sugar at home and reports readings in the 90's - 140.   He continues to walk on a daily basis . He denies episodes of hypoglycemia. He has been eating more vegetables and less carbs or sugars.  Lab Results  Component Value Date   HGBA1C 6.6 (A) 05/22/2024   HGBA1C 7.1 (A) 02/14/2024   HGBA1C 7.0 (A) 11/16/2023   Wt Readings from Last 3 Encounters:  05/22/24 228 lb (103.4 kg)  03/09/24 238 lb (108 kg)  02/14/24 238 lb (108 kg)   Hypertension-managed with lisinopril  20 mg daily.  He has not experienced any shortness of breath, dizziness, lightheadedness, chest pain, or shortness of breath BP Readings from Last 3 Encounters:  05/22/24 130/80  02/14/24 120/68  01/19/24 124/80   Review of Systems See HPI   Past Medical History:  Diagnosis Date   Cataract    CVA (cerebral vascular accident) (HCC) 05/04/2015   L cerebellar, only problem I have is w/balance 05/05/2015)   Hypertension    Type II diabetes mellitus (HCC) dx'd 05/05/2015    Social History   Socioeconomic History   Marital status: Married    Spouse name: Not on file   Number of children: Not on file   Years of education: Not on file   Highest education level: GED or equivalent  Occupational History   Not on file  Tobacco Use   Smoking status: Former    Current packs/day: 0.00    Average packs/day: 1 pack/day for 20.0 years (20.0 ttl pk-yrs)    Types: Cigarettes     Start date: 05/21/1954    Quit date: 05/21/1974    Years since quitting: 50.0   Smokeless tobacco: Never   Tobacco comments:    quit smoking in the 1970's/ educated [employer]   Substance and Sexual Activity   Alcohol use: No    Alcohol/week: 0.0 standard drinks of alcohol   Drug use: No   Sexual activity: Not Currently  Other Topics Concern   Not on file  Social History Narrative   Employed as a Teaching laboratory technician.    Married for 30 years   Has two daughters who live locally.    Social Drivers of Corporate investment banker Strain: Low Risk  (03/09/2024)   Overall Financial Resource Strain (CARDIA)    Difficulty of Paying Living Expenses: Not hard at all  Food Insecurity: No Food Insecurity (03/09/2024)   Hunger Vital Sign    Worried About Running Out of Food in the Last Year: Never true    Ran Out of Food in the Last Year: Never true  Transportation Needs: No Transportation Needs (03/09/2024)   PRAPARE - Administrator, Civil Service (Medical): No    Lack of Transportation (Non-Medical): No  Physical Activity: Sufficiently Active (03/09/2024)   Exercise Vital Sign    Days of Exercise per Week: 7 days  Minutes of Exercise per Session: 50 min  Stress: No Stress Concern Present (03/09/2024)   [employer] of Occupational Health - Occupational Stress Questionnaire    Feeling of Stress: Not at all  Social Connections: Unknown (03/09/2024)   Social Connection and Isolation Panel    Frequency of Communication with Friends and Family: More than three times a week    Frequency of Social Gatherings with Friends and Family: Three times a week    Attends Religious Services: Patient declined    Active Member of Clubs or Organizations: Not on file    Attends Club or Organization Meetings: Not on file    Marital Status: Married  Intimate Partner Violence: Not At Risk (03/09/2024)   Humiliation, Afraid, Rape, and Kick questionnaire    Fear of Current or Ex-Partner: No     Emotionally Abused: No    Physically Abused: No    Sexually Abused: No    Past Surgical History:  Procedure Laterality Date   Cataract Right 2018   EP IMPLANTABLE DEVICE N/A 05/07/2015   Procedure: Loop Recorder Insertion;  Surgeon: Jerel Balding, MD;  Location: [facility];  Service: Cardiovascular;  Laterality: N/A;   KNEE SURGERY Left 02/24/2024   LOOP RECORDER REMOVAL N/A 09/28/2017   Procedure: LOOP RECORDER REMOVAL;  Surgeon: Balding Jerel, MD;  Location: [facility];  Service: Cardiovascular;  Laterality: N/A;   NO PAST SURGERIES     TEE WITHOUT CARDIOVERSION N/A 05/07/2015   Procedure: TRANSESOPHAGEAL ECHOCARDIOGRAM (TEE);  Surgeon: Annabella Scarce, MD;  Location[facility] ENDOSCOPY;  Service: Cardiovascular;  Laterality: N/A;    Family History  Problem Relation Age of Onset   Heart disease Mother    Colon cancer Brother    Heart disease Brother    Esophageal cancer Neg Hx    Rectal cancer Neg Hx    Stomach cancer Neg Hx     Allergies  Allergen Reactions   Penicillins Hives    Has patient had a PCN reaction causing immediate rash, facial/tongue/throat swelling, SOB or lightheadedness with hypotension: No Has patient had a PCN reaction causing severe rash involving mucus membranes or skin necrosis: No Has patient had a PCN reaction that required hospitalization: No Has patient had a PCN reaction occurring within the last 10 years: No If all of the above answers are NO, then may proceed with Cephalosporin use.     Current Outpatient Medications on File Prior to Visit  Medication Sig Dispense Refill   ACCU-CHEK GUIDE test strip USE TO CHECK BLOOD SUGAR 3 TIMES A DAY OR AS NEEDED 100 strip 3   aspirin  EC 325 MG tablet Take 1 tablet (325 mg total) by mouth daily. 30 tablet 0   atorvastatin  (LIPITOR) 20 MG tablet TAKE 1 TABLET BY MOUTH DAILY AT 6PM 90 tablet 3   celecoxib (CELEBREX) 200 MG capsule Take 200 mg by mouth 2 (two) times daily.     glipiZIDE   (GLUCOTROL ) 10 MG tablet TAKE 1 TABLET (10 MG TOTAL) BY MOUTH DAILY BEFORE BREAKFAST. 90 tablet 1   JARDIANCE  25 MG TABS tablet TAKE 1 TABLET BY MOUTH DAILY BEFORE BREAKFAST. 90 tablet 0   Lancets (ACCU-CHEK SOFT TOUCH) lancets Used to check blood sugar 3 times daily 100 each 12   lisinopril  (ZESTRIL ) 20 MG tablet TAKE 1 TABLET BY MOUTH EVERY DAY 90 tablet 3   metFORMIN  (GLUCOPHAGE ) 500 MG tablet TAKE 1 TABLET BY MOUTH TWICE A DAY WITH FOOD 180 tablet 1   Misc Natural  Products ([employer] PROSTATE MAX + PO) Take by mouth.     Multiple Vitamin (MULTIVITAMIN) tablet Take 1 tablet by mouth daily.     naproxen sodium (ALEVE) 220 MG tablet Take 220 mg by mouth 2 (two) times daily as needed (for knee pain.).     OVER THE COUNTER MEDICATION Probiotic extra strength, one capsule daily.     OVER THE COUNTER MEDICATION ClearLax, One capful as needed. May take twice a week.     No current facility-administered medications on file prior to visit.    BP 130/80   Pulse 76   Temp 98.1 F (36.7 C) (Oral)   Ht 5' 11 (1.803 m)   Wt 228 lb (103.4 kg)   SpO2 97%   BMI 31.80 kg/m       Objective:   Physical Exam Vitals and nursing note reviewed.  Constitutional:      Appearance: Normal appearance. He is obese.  Cardiovascular:     Rate and Rhythm: Normal rate and regular rhythm.     Pulses: Normal pulses.     Heart sounds: Normal heart sounds.  Pulmonary:     Effort: Pulmonary effort is normal.     Breath sounds: Normal breath sounds.  Skin:    General: Skin is warm and dry.  Neurological:     General: No focal deficit present.     Mental Status: He is alert and oriented to person, place, and time.  Psychiatric:        Mood and Affect: Mood normal.        Behavior: Behavior normal.        Thought Content: Thought content normal.        Judgment: Judgment normal.        Assessment & Plan:  1. Diabetes mellitus treated with oral medication (HCC) (Primary)  - POC HgB A1c- 6.6. - has  improved. Continue to eat healthy and exercise.  - Follow up in mid November for CPE or sooner if needed - empagliflozin  (JARDIANCE ) 25 MG TABS tablet; Take 1 tablet (25 mg total) by mouth daily before breakfast.  Dispense: 90 tablet; Refill: 1  2. Essential hypertension - Well controlled. No change in medication   Darleene Shape, NP

## 2024-05-22 NOTE — Patient Instructions (Signed)
 Health Maintenance Due  Topic Date Due   Diabetic kidney evaluation - Urine ACR  Never done   INFLUENZA VACCINE  04/20/2024   COVID-19 Vaccine (9 - 2024-25 season) 05/21/2024       03/09/2024   10:16 AM 08/09/2023    7:45 AM 03/07/2023   10:34 AM  Depression screen PHQ 2/9  Decreased Interest 0 0 0  Down, Depressed, Hopeless 0 0 0  PHQ - 2 Score 0 0 0  Altered sleeping  0   Tired, decreased energy  0   Change in appetite  0   Feeling bad or failure about yourself   0   Trouble concentrating  0   Moving slowly or fidgety/restless  0   Suicidal thoughts  0   PHQ-9 Score  0   Difficult doing work/chores  Not difficult at all

## 2024-07-26 ENCOUNTER — Other Ambulatory Visit: Payer: Self-pay | Admitting: Adult Health

## 2024-07-26 DIAGNOSIS — E782 Mixed hyperlipidemia: Secondary | ICD-10-CM

## 2024-07-26 DIAGNOSIS — I10 Essential (primary) hypertension: Secondary | ICD-10-CM

## 2024-07-27 LAB — LAB REPORT - SCANNED: Creatinine, POC: 5 mg/dL

## 2024-08-09 ENCOUNTER — Ambulatory Visit: Admitting: Adult Health

## 2024-08-09 ENCOUNTER — Encounter: Payer: Self-pay | Admitting: Adult Health

## 2024-08-09 ENCOUNTER — Encounter: Admitting: Adult Health

## 2024-08-09 VITALS — BP 110/80 | HR 72 | Temp 98.3°F | Ht 69.0 in | Wt 228.0 lb

## 2024-08-09 DIAGNOSIS — E782 Mixed hyperlipidemia: Secondary | ICD-10-CM

## 2024-08-09 DIAGNOSIS — E119 Type 2 diabetes mellitus without complications: Secondary | ICD-10-CM

## 2024-08-09 DIAGNOSIS — Z0000 Encounter for general adult medical examination without abnormal findings: Secondary | ICD-10-CM | POA: Diagnosis not present

## 2024-08-09 DIAGNOSIS — Z7984 Long term (current) use of oral hypoglycemic drugs: Secondary | ICD-10-CM

## 2024-08-09 DIAGNOSIS — I10 Essential (primary) hypertension: Secondary | ICD-10-CM | POA: Diagnosis not present

## 2024-08-09 DIAGNOSIS — Z125 Encounter for screening for malignant neoplasm of prostate: Secondary | ICD-10-CM | POA: Diagnosis not present

## 2024-08-09 DIAGNOSIS — Z96652 Presence of left artificial knee joint: Secondary | ICD-10-CM | POA: Diagnosis not present

## 2024-08-09 DIAGNOSIS — Z8673 Personal history of transient ischemic attack (TIA), and cerebral infarction without residual deficits: Secondary | ICD-10-CM | POA: Diagnosis not present

## 2024-08-09 DIAGNOSIS — M25562 Pain in left knee: Secondary | ICD-10-CM | POA: Diagnosis not present

## 2024-08-09 LAB — COMPREHENSIVE METABOLIC PANEL WITH GFR
ALT: 19 U/L (ref 0–53)
AST: 20 U/L (ref 0–37)
Albumin: 4.5 g/dL (ref 3.5–5.2)
Alkaline Phosphatase: 96 U/L (ref 39–117)
BUN: 13 mg/dL (ref 6–23)
CO2: 28 meq/L (ref 19–32)
Calcium: 9.4 mg/dL (ref 8.4–10.5)
Chloride: 106 meq/L (ref 96–112)
Creatinine, Ser: 0.85 mg/dL (ref 0.40–1.50)
GFR: 83.82 mL/min (ref >60.00)
Glucose, Bld: 112 mg/dL — ABNORMAL HIGH (ref 70–99)
Potassium: 4.5 meq/L (ref 3.5–5.1)
Sodium: 141 meq/L (ref 135–145)
Total Bilirubin: 0.9 mg/dL (ref 0.2–1.2)
Total Protein: 7 g/dL (ref 6.0–8.3)

## 2024-08-09 LAB — LIPID PANEL
Cholesterol: 144 mg/dL (ref 0–200)
HDL: 39.1 mg/dL (ref >39.00)
LDL Cholesterol: 82 mg/dL (ref 0–99)
NonHDL: 104.94
Total CHOL/HDL Ratio: 4
Triglycerides: 117 mg/dL (ref 0.0–149.0)
VLDL: 23.4 mg/dL (ref 0.0–40.0)

## 2024-08-09 LAB — PSA: PSA: 1.2 ng/mL (ref 0.10–4.00)

## 2024-08-09 LAB — TSH: TSH: 0.83 u[IU]/mL (ref 0.35–5.50)

## 2024-08-09 LAB — CBC
HCT: 46.6 % (ref 39.0–52.0)
Hemoglobin: 14.9 g/dL (ref 13.0–17.0)
MCHC: 32 g/dL (ref 30.0–36.0)
MCV: 85.3 fl (ref 78.0–100.0)
Platelets: 219 K/uL (ref 150.0–400.0)
RBC: 5.47 Mil/uL (ref 4.22–5.81)
RDW: 14.1 % (ref 11.5–15.5)
WBC: 7.7 K/uL (ref 4.0–10.5)

## 2024-08-09 LAB — MICROALBUMIN / CREATININE URINE RATIO
Creatinine,U: 112.2 mg/dL
Microalb Creat Ratio: 7.3 mg/g (ref 0.0–30.0)
Microalb, Ur: 0.8 mg/dL (ref 0.0–1.9)

## 2024-08-09 LAB — HEMOGLOBIN A1C: Hgb A1c MFr Bld: 6.4 % (ref 4.6–6.5)

## 2024-08-09 NOTE — Progress Notes (Signed)
 Subjective:    Patient ID: Trevor Wheeler, male    DOB: 1945-12-30, 78 y.o.   MRN: [id number]  HPI Patient presents for yearly preventative medicine examination. He is a pleasant 78 year old male who  has a past medical history of Cataract, CVA (cerebral vascular accident) (HCC) (05/04/2015), Hypertension, and Type II diabetes mellitus (HCC) (dx'd 05/05/2015).  Diabetes mellitus type 2-maintained on metformin  500 mg twice daily as well as glipizide  10 mg twice daily and Jardiance  25 mg.  He periodically monitors his blood sugar at home and reports readings in the 90's - 140.   He continues to walk on a daily basis . He denies episodes of hypoglycemia. He has been eating more vegetables and less carbs or sugars.  Lab Results  Component Value Date   HGBA1C 6.6 (A) 05/22/2024   HGBA1C 7.1 (A) 02/14/2024   HGBA1C 7.0 (A) 11/16/2023   Hypertension-managed with lisinopril  20 mg daily.  He has not experienced any shortness of breath, dizziness, lightheadedness, chest pain, or shortness of breath BP Readings from Last 3 Encounters:  08/09/24 110/80  05/22/24 130/80  02/14/24 120/68    Hyperlipidemia-managed with Lipitor 20 mg.  He has had excellent control of his lipids in the past.  He denies myalgia or fatigue Lab Results  Component Value Date   CHOL 145 08/09/2023   HDL 32.00 (L) 08/09/2023   LDLCALC 86 08/09/2023   LDLDIRECT 182.2 08/05/2010   TRIG 138.0 08/09/2023   CHOLHDL 5 08/09/2023    History of cryptogenic stroke-currently on aspirin  325 mg daily.  He did have a loop recorder placed but this did not show any atrial fibrillation thankfully was removed in 2019.  He has not had any neurologic or cardiac events since  All immunizations and health maintenance protocols were reviewed with the patient and needed orders were placed.  Appropriate screening laboratory values were ordered for the patient including screening of hyperlipidemia, renal function and hepatic function. If  indicated by BPH, a PSA was ordered.  Medication reconciliation,  past medical history, social history, problem list and allergies were reviewed in detail with the patient  Goals were established with regard to weight loss, exercise, and  diet in compliance with medications. He is staying active and trying to eat a diabetic diet.  Wt Readings from Last 3 Encounters:  08/09/24 228 lb (103.4 kg)  05/22/24 228 lb (103.4 kg)  03/09/24 238 lb (108 kg)   He has no acute issues and reports feeling well overall.   Review of Systems  Constitutional: Negative.   HENT: Negative.    Eyes: Negative.   Respiratory: Negative.    Cardiovascular: Negative.   Gastrointestinal: Negative.   Endocrine: Negative.   Genitourinary: Negative.   Musculoskeletal: Negative.   Skin: Negative.   Allergic/Immunologic: Negative.   Neurological: Negative.   Hematological: Negative.   Psychiatric/Behavioral: Negative.    All other systems reviewed and are negative.  Past Medical History:  Diagnosis Date   Cataract    CVA (cerebral vascular accident) (HCC) 05/04/2015   L cerebellar, only problem I have is w/balance 05/05/2015)   Hypertension    Type II diabetes mellitus (HCC) dx'd 05/05/2015    Social History   Socioeconomic History   Marital status: Married    Spouse name: Not on file   Number of children: Not on file   Years of education: Not on file   Highest education level: GED or equivalent  Occupational History   Not on  file  Tobacco Use   Smoking status: Former    Current packs/day: 0.00    Average packs/day: 1 pack/day for 20.0 years (20.0 ttl pk-yrs)    Types: Cigarettes    Start date: 05/21/1954    Quit date: 05/21/1974    Years since quitting: 50.2   Smokeless tobacco: Never   Tobacco comments:    quit smoking in the 1970's/ educated [employer]   Substance and Sexual Activity   Alcohol use: No    Alcohol/week: 0.0 standard drinks of alcohol   Drug use: No   Sexual activity: Not Currently   Other Topics Concern   Not on file  Social History Narrative   Employed as a Teaching Laboratory Technician.    Married for 30 years   Has two daughters who live locally.    Social Drivers of Corporate Investment Banker Strain: Low Risk  (03/09/2024)   Overall Financial Resource Strain (CARDIA)    Difficulty of Paying Living Expenses: Not hard at all  Food Insecurity: No Food Insecurity (03/09/2024)   Hunger Vital Sign    Worried About Running Out of Food in the Last Year: Never true    Ran Out of Food in the Last Year: Never true  Transportation Needs: No Transportation Needs (03/09/2024)   PRAPARE - Administrator, Civil Service (Medical): No    Lack of Transportation (Non-Medical): No  Physical Activity: Sufficiently Active (03/09/2024)   Exercise Vital Sign    Days of Exercise per Week: 7 days    Minutes of Exercise per Session: 50 min  Stress: No Stress Concern Present (03/09/2024)   [employer] of Occupational Health - Occupational Stress Questionnaire    Feeling of Stress: Not at all  Social Connections: Unknown (03/09/2024)   Social Connection and Isolation Panel    Frequency of Communication with Friends and Family: More than three times a week    Frequency of Social Gatherings with Friends and Family: Three times a week    Attends Religious Services: Patient declined    Active Member of Clubs or Organizations: Not on file    Attends Club or Organization Meetings: Not on file    Marital Status: Married  Intimate Partner Violence: Not At Risk (03/09/2024)   Humiliation, Afraid, Rape, and Kick questionnaire    Fear of Current or Ex-Partner: No    Emotionally Abused: No    Physically Abused: No    Sexually Abused: No    Past Surgical History:  Procedure Laterality Date   Cataract Right 2018   EP IMPLANTABLE DEVICE N/A 05/07/2015   Procedure: Loop Recorder Insertion;  Surgeon: Jerel Balding, MD;  Location: [facility];  Service: Cardiovascular;   Laterality: N/A;   KNEE SURGERY Left 02/24/2024   LOOP RECORDER REMOVAL N/A 09/28/2017   Procedure: LOOP RECORDER REMOVAL;  Surgeon: Balding Jerel, MD;  Location: [facility];  Service: Cardiovascular;  Laterality: N/A;   NO PAST SURGERIES     TEE WITHOUT CARDIOVERSION N/A 05/07/2015   Procedure: TRANSESOPHAGEAL ECHOCARDIOGRAM (TEE);  Surgeon: Annabella Scarce, MD;  Location: [facility] ENDOSCOPY;  Service: Cardiovascular;  Laterality: N/A;    Family History  Problem Relation Age of Onset   Heart disease Mother    Colon cancer Brother    Heart disease Brother    Esophageal cancer Neg Hx    Rectal cancer Neg Hx    Stomach cancer Neg Hx     Allergies  Allergen Reactions   Penicillins Hives  Has patient had a PCN reaction causing immediate rash, facial/tongue/throat swelling, SOB or lightheadedness with hypotension: No Has patient had a PCN reaction causing severe rash involving mucus membranes or skin necrosis: No Has patient had a PCN reaction that required hospitalization: No Has patient had a PCN reaction occurring within the last 10 years: No If all of the above answers are NO, then may proceed with Cephalosporin use.     Current Outpatient Medications on File Prior to Visit  Medication Sig Dispense Refill   ACCU-CHEK GUIDE test strip USE TO CHECK BLOOD SUGAR 3 TIMES A DAY OR AS NEEDED 100 strip 3   atorvastatin  (LIPITOR) 20 MG tablet TAKE 1 TABLET BY MOUTH DAILY AT 6PM 90 tablet 3   celecoxib (CELEBREX) 200 MG capsule Take 200 mg by mouth 2 (two) times daily.     empagliflozin  (JARDIANCE ) 25 MG TABS tablet Take 1 tablet (25 mg total) by mouth daily before breakfast. 90 tablet 1   glipiZIDE  (GLUCOTROL ) 10 MG tablet TAKE 1 TABLET (10 MG TOTAL) BY MOUTH DAILY BEFORE BREAKFAST. 90 tablet 1   Lancets (ACCU-CHEK SOFT TOUCH) lancets Used to check blood sugar 3 times daily 100 each 12   lisinopril  (ZESTRIL ) 20 MG tablet TAKE 1 TABLET BY MOUTH EVERY DAY 90 tablet 3   metFORMIN   (GLUCOPHAGE ) 500 MG tablet TAKE 1 TABLET BY MOUTH TWICE A DAY WITH FOOD 180 tablet 1   Misc Natural Products ([employer]S PROSTATE MAX + PO) Take by mouth.     Multiple Vitamin (MULTIVITAMIN) tablet Take 1 tablet by mouth daily.     naproxen sodium (ALEVE) 220 MG tablet Take 220 mg by mouth 2 (two) times daily as needed (for knee pain.).     OVER THE COUNTER MEDICATION Probiotic extra strength, one capsule daily.     OVER THE COUNTER MEDICATION ClearLax, One capful as needed. May take twice a week.     aspirin  EC 325 MG tablet Take 1 tablet (325 mg total) by mouth daily. (Patient not taking: Reported on 08/09/2024) 30 tablet 0   No current facility-administered medications on file prior to visit.    BP 110/80   Pulse 72   Temp 98.3 F (36.8 C) (Oral)   Ht 5' 9 (1.753 m)   Wt 228 lb (103.4 kg)   SpO2 98%   BMI 33.67 kg/m       Objective:   Physical Exam Vitals and nursing note reviewed.  Constitutional:      General: He is not in acute distress.    Appearance: Normal appearance. He is not ill-appearing.  HENT:     Head: Normocephalic and atraumatic.     Right Ear: Tympanic membrane, ear canal and external ear normal. There is no impacted cerumen.     Left Ear: Tympanic membrane, ear canal and external ear normal. There is no impacted cerumen.     Nose: Nose normal. No congestion or rhinorrhea.     Mouth/Throat:     Mouth: Mucous membranes are moist.     Pharynx: Oropharynx is clear.  Eyes:     Extraocular Movements: Extraocular movements intact.     Conjunctiva/sclera: Conjunctivae normal.     Pupils: Pupils are equal, round, and reactive to light.  Neck:     Vascular: No carotid bruit.  Cardiovascular:     Rate and Rhythm: Normal rate and regular rhythm.     Pulses: Normal pulses.     Heart sounds: No murmur heard.    No  friction rub. No gallop.  Pulmonary:     Effort: Pulmonary effort is normal.     Breath sounds: Normal breath sounds.  Abdominal:     General: Abdomen is  flat. Bowel sounds are normal. There is no distension.     Palpations: Abdomen is soft. There is no mass.     Tenderness: There is no abdominal tenderness. There is no guarding or rebound.     Hernia: No hernia is present.  Musculoskeletal:        General: Normal range of motion.     Cervical back: Normal range of motion and neck supple.  Lymphadenopathy:     Cervical: No cervical adenopathy.  Skin:    General: Skin is warm and dry.     Capillary Refill: Capillary refill takes less than 2 seconds.  Neurological:     General: No focal deficit present.     Mental Status: He is alert and oriented to person, place, and time.  Psychiatric:        Mood and Affect: Mood normal.        Behavior: Behavior normal.        Thought Content: Thought content normal.        Judgment: Judgment normal.         Assessment & Plan:  1. Routine general medical examination at a health care facility (Primary) Today patient counseled on age appropriate routine health concerns for screening and prevention, each reviewed and up to date or declined. Immunizations reviewed and up to date or declined. Labs ordered and reviewed. Risk factors for depression reviewed and negative. Hearing function and visual acuity are intact. ADLs screened and addressed as needed. Functional ability and level of safety reviewed and appropriate. Education, counseling and referrals performed based on assessed risks today. Patient provided with a copy of personalized plan for preventive services. - Follow up in one year for CPE or sooner if needed  2. Diabetes mellitus treated with oral medication (HCC) - Continue with current therapy.  - Continue to work on weight loss  - Follow up in 3 or 6 months depending on A1c  - Lipid panel; Future - TSH; Future - CBC; Future - Comprehensive metabolic panel with GFR; Future - Hemoglobin A1c; Future - Microalbumin/Creatinine Ratio, Urine; Future  3. Essential hypertension - Controlled.  No change  - Lipid panel; Future - TSH; Future - CBC; Future - Comprehensive metabolic panel with GFR; Future  4. Mixed hyperlipidemia - Consider increase in statin  - Lipid panel; Future - TSH; Future - CBC; Future - Comprehensive metabolic panel with GFR; Future  5. History of CVA (cerebrovascular accident) - Continue with asa  - Lipid panel; Future - TSH; Future - CBC; Future - Comprehensive metabolic panel with GFR; Future  6. Prostate cancer screening  - PSA; Future  Darleene Shape, NP

## 2024-08-10 ENCOUNTER — Ambulatory Visit: Payer: Self-pay | Admitting: Adult Health

## 2024-09-13 ENCOUNTER — Emergency Department (HOSPITAL_COMMUNITY)

## 2024-09-13 ENCOUNTER — Other Ambulatory Visit: Payer: Self-pay

## 2024-09-13 ENCOUNTER — Emergency Department (HOSPITAL_COMMUNITY)
Admission: EM | Admit: 2024-09-13 | Discharge: 2024-09-13 | Disposition: A | Discharge status: Home / Self Care | Attending: Emergency Medicine | Admitting: Emergency Medicine

## 2024-09-13 DIAGNOSIS — Z7984 Long term (current) use of oral hypoglycemic drugs: Secondary | ICD-10-CM | POA: Diagnosis not present

## 2024-09-13 DIAGNOSIS — J101 Influenza due to other identified influenza virus with other respiratory manifestations: Secondary | ICD-10-CM | POA: Diagnosis not present

## 2024-09-13 DIAGNOSIS — E119 Type 2 diabetes mellitus without complications: Secondary | ICD-10-CM | POA: Diagnosis not present

## 2024-09-13 DIAGNOSIS — J069 Acute upper respiratory infection, unspecified: Secondary | ICD-10-CM

## 2024-09-13 DIAGNOSIS — Z79899 Other long term (current) drug therapy: Secondary | ICD-10-CM | POA: Diagnosis not present

## 2024-09-13 DIAGNOSIS — Z7982 Long term (current) use of aspirin: Secondary | ICD-10-CM | POA: Insufficient documentation

## 2024-09-13 DIAGNOSIS — I10 Essential (primary) hypertension: Secondary | ICD-10-CM | POA: Insufficient documentation

## 2024-09-13 DIAGNOSIS — R059 Cough, unspecified: Secondary | ICD-10-CM | POA: Diagnosis present

## 2024-09-13 LAB — COMPREHENSIVE METABOLIC PANEL WITH GFR
ALT: 22 U/L (ref 0–44)
AST: 31 U/L (ref 15–41)
Albumin: 4.4 g/dL (ref 3.5–5.0)
Alkaline Phosphatase: 111 U/L (ref 38–126)
Anion gap: 11 (ref 5–15)
BUN: 9 mg/dL (ref 8–23)
CO2: 24 mmol/L (ref 22–32)
Calcium: 9.1 mg/dL (ref 8.9–10.3)
Chloride: 100 mmol/L (ref 98–111)
Creatinine, Ser: 0.9 mg/dL (ref 0.61–1.24)
GFR, Estimated: >60 mL/min
Glucose, Bld: 124 mg/dL — ABNORMAL HIGH (ref 70–99)
Potassium: 4.1 mmol/L (ref 3.5–5.1)
Sodium: 136 mmol/L (ref 135–145)
Total Bilirubin: 0.7 mg/dL (ref 0.0–1.2)
Total Protein: 7.4 g/dL (ref 6.5–8.1)

## 2024-09-13 LAB — RESP PANEL BY RT-PCR (RSV, FLU A&B, COVID)  RVPGX2
Influenza A by PCR: POSITIVE — AB
Influenza B by PCR: NEGATIVE
Resp Syncytial Virus by PCR: NEGATIVE
SARS Coronavirus 2 by RT PCR: NEGATIVE

## 2024-09-13 LAB — CBC
HCT: 47 % (ref 39.0–52.0)
Hemoglobin: 14.8 g/dL (ref 13.0–17.0)
MCH: 27.2 pg (ref 26.0–34.0)
MCHC: 31.5 g/dL (ref 30.0–36.0)
MCV: 86.2 fL (ref 80.0–100.0)
Platelets: 201 K/uL (ref 150–400)
RBC: 5.45 MIL/uL (ref 4.22–5.81)
RDW: 13.2 % (ref 11.5–15.5)
WBC: 8.9 K/uL (ref 4.0–10.5)
nRBC: 0 % (ref 0.0–0.2)

## 2024-09-13 MED ORDER — OSELTAMIVIR PHOSPHATE 75 MG PO CAPS: 75.0000 mg | ORAL_CAPSULE | Freq: Two times a day (BID) | ORAL | 0 refills | Status: AC

## 2024-09-13 MED ORDER — IBUPROFEN 800 MG PO TABS
800.0000 mg | ORAL_TABLET | Freq: Once | ORAL | Status: AC
  Administered 2024-09-13: 800 mg via ORAL
  Filled 2024-09-13: qty 1

## 2024-09-13 MED ORDER — ACETAMINOPHEN 325 MG PO TABS
650.0000 mg | ORAL_TABLET | Freq: Once | ORAL | Status: AC | PRN
  Administered 2024-09-13: 650 mg via ORAL
  Filled 2024-09-13: qty 2

## 2024-09-13 NOTE — ED Triage Notes (Signed)
 With woke with dizziness, wife reports pt with cough, weakness, and unwell feeling x 1-2 days. Temp 103.1 in triage

## 2024-09-13 NOTE — Discharge Instructions (Addendum)
 Thank you for visiting the Emergency Department today. It was a pleasure to be part of your healthcare team.   Your were seen today for flulike symptoms, and your test results showed you have tested positive for the flu.  As discussed, utilize Tylenol  and ibuprofen  as needed for pain/fever relief, rest, hydrate, and resume normal diet as tolerated.  You have been prescribed Tamiflu , and you should take your medications as directed. If you have any questions about your medicines, please call your pharmacy or healthcare provider.  It is important to watch for warning signs such as worsening fever, trouble breathing, chest pain. If any of these happen, return to the Emergency Department or call 911.  Thank you for trusting us  with your health.

## 2024-09-13 NOTE — ED Provider Notes (Signed)
 " [place] EMERGENCY DEPARTMENT AT [facility] Provider Note   CSN: [id number] Arrival date & time: 09/13/24  1031     Patient presents with: URI   Trevor Wheeler is a 78 y.o. male who presents with 2 days of flulike symptoms including fever, chills, myalgias, headache, nasal congestion, sore throat, and nonproductive cough.  The patient reports associated fatigue and decreased appetite, with intermittent nausea but denies persistent vomiting or diarrhea.  Symptoms began gradually have remained despite supportive care at home including antipyretics and over-the-counter cold/flu medication.  The patient denies chest pain, dyspnea at rest, hemoptysis, syncope, confusion, neck stiffness, or focal neurological deficits.  The patient is in no acute distress.    URI Presenting symptoms: cough        Prior to Admission medications  Medication Sig Start Date End Date Taking? Authorizing Provider  oseltamivir  (TAMIFLU ) 75 MG capsule Take 1 capsule (75 mg total) by mouth 2 (two) times daily for 5 days. 09/13/24 09/18/24 Yes Dalphine Cowie L, PA  ACCU-CHEK GUIDE test strip USE TO CHECK BLOOD SUGAR 3 TIMES A DAY OR AS NEEDED 09/23/20   Nafziger, Darleene, NP  aspirin  EC 325 MG tablet Take 1 tablet (325 mg total) by mouth daily. Patient not taking: Reported on 08/09/2024 03/16/17   Jerri Pfeiffer, MD  atorvastatin  (LIPITOR) 20 MG tablet TAKE 1 TABLET BY MOUTH DAILY AT [facility] 07/26/24   Nafziger, Darleene, NP  celecoxib (CELEBREX) 200 MG capsule Take 200 mg by mouth 2 (two) times daily. 03/01/24   [provider]  empagliflozin  (JARDIANCE ) 25 MG TABS tablet Take 1 tablet (25 mg total) by mouth daily before breakfast. 05/22/24   Nafziger, Darleene, NP  glipiZIDE  (GLUCOTROL ) 10 MG tablet TAKE 1 TABLET (10 MG TOTAL) BY MOUTH DAILY BEFORE BREAKFAST. 12/29/23   Nafziger, Darleene, NP  Lancets (ACCU-CHEK SOFT TOUCH) lancets Used to check blood sugar 3 times daily 07/30/20   Nafziger, Darleene, NP  lisinopril  (ZESTRIL )  20 MG tablet TAKE 1 TABLET BY MOUTH EVERY DAY 07/26/24   Nafziger, Darleene, NP  metFORMIN  (GLUCOPHAGE ) 500 MG tablet TAKE 1 TABLET BY MOUTH TWICE A DAY WITH FOOD 12/29/23   Nafziger, Darleene, NP  Misc Natural Products ([employer] PROSTATE MAX + PO) Take by mouth.    [provider]  Multiple Vitamin (MULTIVITAMIN) tablet Take 1 tablet by mouth daily.    [provider]  naproxen sodium (ALEVE) 220 MG tablet Take 220 mg by mouth 2 (two) times daily as needed (for knee pain.).    [provider]  OVER THE COUNTER MEDICATION Probiotic extra strength, one capsule daily.    [provider]  OVER THE COUNTER MEDICATION ClearLax, One capful as needed. May take twice a week.    [provider]    Allergies: Penicillins    Review of Systems  Respiratory:  Positive for cough.     Updated Vital Signs BP 123/64 (BP Location: Left Arm)   Pulse (!) 103   Temp 99.1 F (37.3 C) (Oral)   Resp 18   SpO2 100%   Physical Exam Vitals and nursing note reviewed.  Constitutional:      General: He is awake. He is not in acute distress.    Appearance: Normal appearance. He is not toxic-appearing.  HENT:     Head: Normocephalic and atraumatic.     Nose: Congestion and rhinorrhea present. Rhinorrhea is clear.     Right Sinus: Maxillary sinus tenderness present.  Left Sinus: Maxillary sinus tenderness present.     Mouth/Throat:     Mouth: Mucous membranes are moist.     Pharynx: Uvula midline. Posterior oropharyngeal erythema present. No pharyngeal swelling, oropharyngeal exudate or uvula swelling.     Tonsils: No tonsillar exudate or tonsillar abscesses.  Eyes:     General: Lids are normal. Vision grossly intact.     Extraocular Movements: Extraocular movements intact.     Conjunctiva/sclera: Conjunctivae normal.     Pupils: Pupils are equal, round, and reactive to light.  Cardiovascular:     Rate and Rhythm: Normal rate and regular rhythm.     Pulses:           Radial pulses are 2+ on the right side.  Pulmonary:     Effort: Pulmonary effort is normal.     Breath sounds: Normal breath sounds. No wheezing.     Comments: Patient has no difficulty speaking in complete sentences.  Abdominal:     General: Abdomen is flat.     Palpations: Abdomen is soft.     Tenderness: There is no abdominal tenderness.  Musculoskeletal:     Cervical back: Full passive range of motion without pain and neck supple. No rigidity. No spinous process tenderness.  Lymphadenopathy:     Cervical: Cervical adenopathy present.  Skin:    General: Skin is warm and dry.     Capillary Refill: Capillary refill takes less than 2 seconds.     Findings: No rash.     Comments: Tactile fever on exam   Neurological:     General: No focal deficit present.     Mental Status: He is alert.  Psychiatric:        Attention and Perception: Attention normal.        Mood and Affect: Mood normal.        Speech: Speech normal.     (all labs ordered are listed, but only abnormal results are displayed) Labs Reviewed  RESP PANEL BY RT-PCR (RSV, FLU A&B, COVID)  [id number] - Abnormal; Notable for the following components:      Result Value   Influenza A by PCR POSITIVE (*)    All other components within normal limits  COMPREHENSIVE METABOLIC PANEL WITH GFR - Abnormal; Notable for the following components:   Glucose, Bld 124 (*)    All other components within normal limits  CBC    EKG: EKG Interpretation Date/Time:  Thursday September 13 2024 10:43:49 EST Ventricular Rate:  119 PR Interval:  179 QRS Duration:  89 QT Interval:  298 QTC Calculation: 420 R Axis:   -35  Text Interpretation: Sinus tachycardia Left axis deviation Borderline low voltage, extremity leads Abnormal R-wave progression, late transition Confirmed by Cottie Cough [phone]) on 09/13/2024 10:45:46 AM  Radiology: No results found.   Procedures   Medications Ordered in the ED  acetaminophen  (TYLENOL ) tablet 650  mg (650 mg Oral Given 09/13/24 1059)  ibuprofen  (ADVIL ) tablet 800 mg (800 mg Oral Given 09/13/24 1219)                                 Medical Decision Making Amount and/or Complexity of Data Reviewed Labs: ordered.  Risk OTC drugs.   Patient presents to the ED for: flu-like symptoms  This involves an extensive number of treatment options Differential diagnosis includes: Infectious etiology  Co-morbid conditions: HTN, DMT2, CVA  Clinical Course as of 09/16/24  9192  Thu Sep 13, 2024  1115 Temp(!): 103.1 F (39.5 C) Febrile, tachycardic, patient in no acute distress [ML]  1156 Comprehensive metabolic panel(!) No acute findings [ML]  1202 Resp panel by RT-PCR (RSV, Flu A&B, Covid) Anterior Nasal Swab(!) Flu A+ [ML]  1225 CBC WNL [ML]  1225 DG Chest Portable 1 View Unremarkable [ML]  1226 EKG 12-Lead Sinus tachycardia [ML]  1308 Temp: 99.1 F (37.3 C) Patient now afebrile, tachycardia improved [ML]    Clinical Course User Index [ML] Willma Duwaine CROME, PA    Data Reviewed / Actions Taken: Labs ordered/reviewed with my independent interpretation in ED course above. Imaging ordered/reviewed with my independent interpretation in ED course above. I agree with the radiologists interpretation.  EKG ordered/reviewed with my independent interpretation in ED course above. The patient did not require continuous cardiac monitoring during the ED stay.  Management / Treatments: See ED course above for medications, treatments administered, and clinical rationale.   Reevaluation of the patient after these medicines showed that the patient improved.  I have reviewed the patients home medicines and have made adjustments as needed  ED Course / Reassessments: Problem List: Influenza A 78 year old male presented for flu-like symptoms. Initial assessment included history, physical exam, and review of prior medical records. Laboratory testing was obtained given clinical presentation and viral  PCR testing returned positive for influenza A.  There is low clinical suspicion for bacterial etiology requiring antibiotic treatment at this time given reassuring workup and overall clinical appearance. The patient was treated symptomatically with antipyretics and supportive care - tachycardia and fever successfully reduced with patient noting relief on reassessment.  Antiviral therapy was discussed, including risks and benefit and was initiated based on symptom duration, risk factors, and shared decision making.  The patient demonstrated clinical improvement during the ED course and was deemed appropriate for outpatient management.  Discharge planning include strict return precautions for worsening respiratory symptoms, persistent high fevers, chest pain, inability to tolerate oral intake, or new neurological symptoms.  The patient was advised on continued supportive care at home, including hydration, rest, and antipyretic use, as well as isolation precautions to limit transmission.  Patient response: improved with ED management  Serial reassessments performed: Yes    Disposition: Disposition: Discharge with close follow-up with PCP for further evaluation and care Rationale for disposition: stable for discharge  The disposition plan and rationale were discussed with the patient at the bedside, all questions were addressed, and the patient demonstrated understanding.  This note was produced using [employer]. While I have reviewed and verified all clinical information, transcription errors may remain.      Final diagnoses:  Viral upper respiratory illness    ED Discharge Orders          Ordered    oseltamivir  (TAMIFLU ) 75 MG capsule  2 times daily        09/13/24 1311               Willma Duwaine CROME, [place] 09/16/24 0809    Cottie Donnice PARAS, MD 09/16/24 0940  "

## 2025-02-07 ENCOUNTER — Ambulatory Visit: Admitting: Adult Health

## 2025-03-15 ENCOUNTER — Ambulatory Visit
